# Patient Record
Sex: Female | Born: 1970 | Race: White | Hispanic: No | Marital: Single | Smoking: Current every day smoker
Health system: Southern US, Community
[De-identification: ages and names within clinical notes are randomized; demographics above are authoritative.]

## PROBLEM LIST (undated history)

## (undated) DIAGNOSIS — D649 Anemia, unspecified: Secondary | ICD-10-CM

## (undated) DIAGNOSIS — J189 Pneumonia, unspecified organism: Secondary | ICD-10-CM

## (undated) DIAGNOSIS — J449 Chronic obstructive pulmonary disease, unspecified: Secondary | ICD-10-CM

## (undated) DIAGNOSIS — D689 Coagulation defect, unspecified: Secondary | ICD-10-CM

## (undated) DIAGNOSIS — R05 Cough: Secondary | ICD-10-CM

## (undated) DIAGNOSIS — D75839 Thrombocytosis, unspecified: Secondary | ICD-10-CM

## (undated) DIAGNOSIS — I10 Essential (primary) hypertension: Secondary | ICD-10-CM

## (undated) DIAGNOSIS — G4700 Insomnia, unspecified: Secondary | ICD-10-CM

## (undated) DIAGNOSIS — M797 Fibromyalgia: Secondary | ICD-10-CM

## (undated) DIAGNOSIS — E876 Hypokalemia: Secondary | ICD-10-CM

## (undated) DIAGNOSIS — R0600 Dyspnea, unspecified: Secondary | ICD-10-CM

## (undated) DIAGNOSIS — E785 Hyperlipidemia, unspecified: Secondary | ICD-10-CM

## (undated) DIAGNOSIS — R079 Chest pain, unspecified: Secondary | ICD-10-CM

## (undated) DIAGNOSIS — R42 Dizziness and giddiness: Secondary | ICD-10-CM

## (undated) DIAGNOSIS — E559 Vitamin D deficiency, unspecified: Secondary | ICD-10-CM

## (undated) DIAGNOSIS — F419 Anxiety disorder, unspecified: Secondary | ICD-10-CM

## (undated) DIAGNOSIS — J45909 Unspecified asthma, uncomplicated: Secondary | ICD-10-CM

## (undated) DIAGNOSIS — D509 Iron deficiency anemia, unspecified: Secondary | ICD-10-CM

## (undated) DIAGNOSIS — D473 Essential (hemorrhagic) thrombocythemia: Secondary | ICD-10-CM

## (undated) DIAGNOSIS — R7303 Prediabetes: Secondary | ICD-10-CM

## (undated) DIAGNOSIS — T7840XA Allergy, unspecified, initial encounter: Secondary | ICD-10-CM

## (undated) HISTORY — PX: TRIGGER FINGER RELEASE: SHX641

## (undated) HISTORY — DX: Thrombocytosis, unspecified: D75.839

## (undated) HISTORY — DX: Anxiety disorder, unspecified: F41.9

## (undated) HISTORY — DX: Anemia, unspecified: D64.9

## (undated) HISTORY — DX: Iron deficiency anemia, unspecified: D50.9

## (undated) HISTORY — DX: Coagulation defect, unspecified: D68.9

## (undated) HISTORY — DX: Hypokalemia: E87.6

## (undated) HISTORY — DX: Allergy, unspecified, initial encounter: T78.40XA

## (undated) HISTORY — DX: Chest pain, unspecified: R07.9

## (undated) HISTORY — DX: Unspecified asthma, uncomplicated: J45.909

## (undated) HISTORY — DX: Essential (primary) hypertension: I10

## (undated) HISTORY — DX: Dizziness and giddiness: R42

---
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
## 2001-02-04 ENCOUNTER — Emergency Department (HOSPITAL_COMMUNITY): Admission: EM | Admit: 2001-02-04 | Discharge: 2001-02-05 | Payer: Self-pay | Admitting: Emergency Medicine

## 2001-02-04 ENCOUNTER — Encounter: Payer: Self-pay | Admitting: Emergency Medicine

## 2001-02-28 ENCOUNTER — Other Ambulatory Visit: Admission: RE | Admit: 2001-02-28 | Discharge: 2001-02-28 | Payer: Self-pay | Admitting: Obstetrics and Gynecology

## 2001-04-18 ENCOUNTER — Encounter: Payer: Self-pay | Admitting: Obstetrics & Gynecology

## 2001-04-18 ENCOUNTER — Ambulatory Visit (HOSPITAL_COMMUNITY): Admission: RE | Admit: 2001-04-18 | Discharge: 2001-04-18 | Payer: Self-pay | Admitting: Obstetrics & Gynecology

## 2001-05-02 HISTORY — PX: TUBAL LIGATION: SHX77

## 2001-05-02 HISTORY — PX: CHOLECYSTECTOMY: SHX55

## 2001-08-15 ENCOUNTER — Inpatient Hospital Stay (HOSPITAL_COMMUNITY): Admission: AD | Admit: 2001-08-15 | Discharge: 2001-08-15 | Payer: Self-pay | Admitting: Obstetrics and Gynecology

## 2001-09-05 ENCOUNTER — Inpatient Hospital Stay (HOSPITAL_COMMUNITY): Admission: AD | Admit: 2001-09-05 | Discharge: 2001-09-08 | Payer: Self-pay | Admitting: Obstetrics & Gynecology

## 2001-11-15 ENCOUNTER — Encounter (HOSPITAL_BASED_OUTPATIENT_CLINIC_OR_DEPARTMENT_OTHER): Payer: Self-pay | Admitting: General Surgery

## 2001-11-15 ENCOUNTER — Ambulatory Visit (HOSPITAL_COMMUNITY): Admission: RE | Admit: 2001-11-15 | Discharge: 2001-11-16 | Payer: Self-pay | Admitting: General Surgery

## 2001-11-15 ENCOUNTER — Encounter (INDEPENDENT_AMBULATORY_CARE_PROVIDER_SITE_OTHER): Payer: Self-pay | Admitting: Specialist

## 2004-02-19 ENCOUNTER — Ambulatory Visit (HOSPITAL_COMMUNITY): Admission: RE | Admit: 2004-02-19 | Discharge: 2004-02-19 | Payer: Self-pay | Admitting: Family Medicine

## 2004-02-19 ENCOUNTER — Emergency Department (HOSPITAL_COMMUNITY): Admission: EM | Admit: 2004-02-19 | Discharge: 2004-02-19 | Payer: Self-pay | Admitting: Family Medicine

## 2005-01-26 ENCOUNTER — Emergency Department (HOSPITAL_COMMUNITY): Admission: EM | Admit: 2005-01-26 | Discharge: 2005-01-26 | Payer: Self-pay | Admitting: Emergency Medicine

## 2007-05-01 ENCOUNTER — Emergency Department (HOSPITAL_COMMUNITY): Admission: EM | Admit: 2007-05-01 | Discharge: 2007-05-01 | Payer: Self-pay | Admitting: Emergency Medicine

## 2007-07-18 ENCOUNTER — Emergency Department (HOSPITAL_COMMUNITY): Admission: EM | Admit: 2007-07-18 | Discharge: 2007-07-19 | Payer: Self-pay | Admitting: Emergency Medicine

## 2009-08-09 ENCOUNTER — Emergency Department (HOSPITAL_COMMUNITY): Admission: EM | Admit: 2009-08-09 | Discharge: 2009-08-09 | Payer: Self-pay | Admitting: Family Medicine

## 2010-08-24 ENCOUNTER — Inpatient Hospital Stay (INDEPENDENT_AMBULATORY_CARE_PROVIDER_SITE_OTHER)
Admission: RE | Admit: 2010-08-24 | Discharge: 2010-08-24 | Disposition: A | Discharge status: Home / Self Care | Payer: BC Managed Care – PPO | Source: Ambulatory Visit | Attending: Family Medicine | Admitting: Family Medicine

## 2010-08-24 ENCOUNTER — Ambulatory Visit (INDEPENDENT_AMBULATORY_CARE_PROVIDER_SITE_OTHER): Payer: BC Managed Care – PPO

## 2010-08-24 DIAGNOSIS — J189 Pneumonia, unspecified organism: Secondary | ICD-10-CM

## 2010-09-02 ENCOUNTER — Emergency Department (HOSPITAL_COMMUNITY)
Admission: EM | Admit: 2010-09-02 | Discharge: 2010-09-03 | Disposition: A | Discharge status: Home / Self Care | Payer: BC Managed Care – PPO | Attending: Emergency Medicine | Admitting: Emergency Medicine

## 2010-09-02 ENCOUNTER — Emergency Department (HOSPITAL_COMMUNITY): Payer: BC Managed Care – PPO

## 2010-09-02 DIAGNOSIS — M25569 Pain in unspecified knee: Secondary | ICD-10-CM | POA: Insufficient documentation

## 2010-09-02 DIAGNOSIS — M7989 Other specified soft tissue disorders: Secondary | ICD-10-CM | POA: Insufficient documentation

## 2010-09-02 DIAGNOSIS — IMO0002 Reserved for concepts with insufficient information to code with codable children: Secondary | ICD-10-CM | POA: Insufficient documentation

## 2010-09-02 DIAGNOSIS — S8000XA Contusion of unspecified knee, initial encounter: Secondary | ICD-10-CM | POA: Insufficient documentation

## 2010-09-17 NOTE — Op Note (Signed)
[place]  Patient:    Erin Zamora, Erin Zamora Visit Number: [id number] MRN: [id number]          Service Type: DSU Location: 5700 [id number] [id number] Attending Physician:  Sonda Primes Dictated by:   Miguel Aschoff, M.D. Proc. Date: 11/15/01 Admit Date:  11/15/2001 Discharge Date: 11/16/2001                             Operative Report  PREOPERATIVE DIAGNOSIS:  Desire for sterilization.  POSTOPERATIVE DIAGNOSIS:  Desire for sterilization.  OPERATION PERFORMED:  Laparoscopic tubal using cautery and division.  SURGEON:  Miguel Aschoff, M.D.  ANESTHESIA:  General.  COMPLICATIONS:  None.  INDICATIONS FOR PROCEDURE:  The patient is a 40 year old white female gravida 3, para 2-0-1-2, who delivered on [birth year] 06, 2000.  The patient had requested that a sterilization procedure be performed and has given her informed consent for tubal sterilization and has all signed all appropriate Medicaid forms 30 days prior to this procedure being done. The patient was found to have gallstones and was to undergo a laparoscopic cholecystectomy by Dr. Leonie Man and at the time of this procedure requested that the tubal sterilization be performed simultaneously.  With informed consents being obtained, the procedure was carried out.  DESCRIPTION OF PROCEDURE:  The patient was already under general anesthesia and prepped and draped with the laparoscope in place from laparoscopic cholecystectomy.  The abdomen was inflated and there was good visualization of the uterus and fallopian tubes and ovaries.  A superpubic 5 mm port was established under direct visualization and then the tripolar cautery forceps introduced.  The midportion of each tube was grasped and cauterized for approximately 3 cm and then the tubes were divided in the midsection via cautery without difficulty with good separation and good hemostasis.  The ovaries and tubes otherwise were within normal limits  as was the uterus and no other abnormalities were noted in the pelvis.  At this point this portion of the procedure was completed and Dr. Lurene Shadow proceeded to remove the remainder of the laparoscopic instruments and closed the abdomen.  This portion of the procedure will be dictated as a separate note as well as the laparoscopic cholecystectomy procedure.  The patient was taken to the recovery room in satisfactory.  She is to be seen back for her follow-up visit with me in four weeks.  She is to call if there are any problems such as fever, pain or heavy bleeding. Dictated by:   Miguel Aschoff, M.D. Attending Physician:  Sonda Primes DD:  11/15/01 TD:  11/20/01 Job: [id number]

## 2010-09-17 NOTE — Op Note (Signed)
[place]  Patient:    Erin Zamora, Erin Zamora Visit Number: [id number] MRN: [id number]          Service Type: DSU Location: 5700 5741 [id number] Attending Physician:  Sonda Primes Dictated by:   Mardene Celeste. Lurene Shadow, M.D. Proc. Date: 11/15/01 Admit Date:  11/15/2001 Discharge Date: 11/16/2001                             Operative Report  PREOPERATIVE DIAGNOSIS:  Chronic calculus cholecystitis.  POSTOPERATIVE DIAGNOSIS:  Chronic calculus cholecystitis.  OPERATION PERFORMED:  Laparoscopic cholecystectomy with intraoperative cholangiogram.  SURGEON:  Luisa Hart L. Lurene Shadow, M.D.  ASSISTANT:  Marnee Spring. Wiliam Ke, M.D.  ANESTHESIA:  General.  INDICATIONS FOR PROCEDURE:  This patient is a 40 year old woman two months postpartum presenting with symptoms of biliary colic.  She has been evaluated and noted to have cholelithiasis by ultrasound.  Liver function studies were all within normal limits.  She comes now to the operating room for laparoscopic cholecystectomy after the risks and potential benefits of surgery have been fully discussed and she gives consent.  She is also scheduled to have bilateral tubal ligation by Miguel Aschoff, M.D. and these operations will be done in sequence.  DESCRIPTION OF PROCEDURE:  Following the induction of satisfactory general anesthesia with the patient positioned supinely.  The abdomen was prepped and draped routinely.  The open laparoscopy was carried out at the umbilicus with insertion of a Hasson cannula an insufflation of a peritoneal cavity with 14 mHg pressure.  Camera was inserted and visual exploration of the abdomen carried out.  The gallbladder was noted to be chronically scarred.  The liver edge was sharp, the liver surface smooth, anterior gastric wall, duodenal sweep appeared to be normal.  None of the small or large intestines we viewed appeared to be abnormal.  We also viewed the uterus.  There was a  small fibroid over the dome of the uterus.  The tubes and bilateral ovaries could be seen clearly and appeared to be normal.  Under direct vision, epigastric and lateral ports were placed.  The gallbladder was grasped and retracted cephalad and dissection carried down by the ampulla of the gallbladder with isolation of the cystic artery and cystic duct, the cystic artery being traced up to its entry in the gallbladder wall and the cystic duct being traced up to its gallbladder cystic duct junction and down to the common duct cystic duct junction.  The cystic artery was doubly clipped and transected.  The cystic duct was clipped proximally and opened.  A cystic duct cholangiogram was carried out after a Reddick catheter was passed into the abdomen and inserted into the cystic duct.  A one half strength Hypaque solution was injected into the extrahepatic biliary system. The cholangiogram showed free flow of contrast into the duodenum.  Normal caliber and no filling defects noted.  The upper radicals also appeared normal.  The cholangiocatheter was then removed and the cystic duct was doubly clipped and transected.  The gallbladder was then dissected free from the liver bed using electrocautery and maintaining hemostasis throughout the entire course of the dissection.  At the end of the dissection, the liver bed was again inspected.  Additional bleeding points were treated with electrocautery.  The camera was then removed through the epigastric port, the gallbladder was retrieved through the umbilical port without difficulty.  The lateral flank wounds were then closed.  At this  point Dr. Tenny Craw entered the operative field.  He made a superpubic incision and carried out a bilateral tubal ligation which will be dictated in a separate note.  At the end, sponge, instrument and sharp counts were verified. Pneumoperitoneum was allowed to deflate and the abdominal would was closed in layers as  followed.  The umbilical wound in two layers with 0 Dexon and 4-0 Dexon, the epigastric wound with 4-0 Dexon and the suprapubic wound with 4-0 Dexon.  All the wounds were then reinforced with Steri-Strips.  Sterile dressing was applied.  Anesthetic reversed.  Patient removed from the operating room to the recovery room in stable condition having tolerated the procedure well. Dictated by:   Mardene Celeste. Lurene Shadow, M.D. Attending Physician:  Sonda Primes DD:  11/15/01 TD:  11/20/01 Job: [id number]

## 2011-01-24 LAB — CBC
HCT: 38.9
MCHC: 34.6
MCV: 89
Platelets: 427 — ABNORMAL HIGH
RBC: 4.37
WBC: 14.8 — ABNORMAL HIGH

## 2011-01-24 LAB — I-STAT 8, (EC8 V) (CONVERTED LAB)
Acid-Base Excess: 1
BUN: 7
Operator id: 257131
Sodium: 137
pCO2, Ven: 41.6 — ABNORMAL LOW
pH, Ven: 7.399 — ABNORMAL HIGH

## 2011-01-24 LAB — URINE MICROSCOPIC-ADD ON

## 2011-01-24 LAB — URINALYSIS, ROUTINE W REFLEX MICROSCOPIC
Glucose, UA: NEGATIVE
Ketones, ur: 15 — AB
Specific Gravity, Urine: 1.008
Urobilinogen, UA: 0.2

## 2011-01-24 LAB — DIFFERENTIAL
Basophils Absolute: 0.1
Basophils Relative: 1
Eosinophils Relative: 1
Lymphocytes Relative: 19
Lymphs Abs: 2.8
Neutro Abs: 10.8 — ABNORMAL HIGH

## 2011-01-24 LAB — POCT I-STAT CREATININE
Creatinine, Ser: 0.8
Operator id: 257131

## 2011-01-24 LAB — POCT PREGNANCY, URINE
Operator id: 257131
Preg Test, Ur: NEGATIVE

## 2011-01-24 LAB — URINE CULTURE

## 2011-02-04 LAB — COMPREHENSIVE METABOLIC PANEL
Alkaline Phosphatase: 49
BUN: 15
CO2: 29
Chloride: 100
Creatinine, Ser: 1.74 — ABNORMAL HIGH
GFR calc Af Amer: 40 — ABNORMAL LOW
GFR calc non Af Amer: 33 — ABNORMAL LOW
Glucose, Bld: 106 — ABNORMAL HIGH
Potassium: 4.4
Sodium: 139
Total Bilirubin: 0.9

## 2011-02-04 LAB — CBC
Hemoglobin: 13.8
MCHC: 34.7
MCV: 88.2
RBC: 4.5
RDW: 12.8
WBC: 15.1 — ABNORMAL HIGH

## 2011-02-04 LAB — DIFFERENTIAL: Lymphocytes Relative: 7 — ABNORMAL LOW

## 2011-02-04 LAB — URINALYSIS, ROUTINE W REFLEX MICROSCOPIC
Bilirubin Urine: NEGATIVE
Protein, ur: NEGATIVE
Urobilinogen, UA: 0.2

## 2011-02-04 LAB — URINE CULTURE: Culture: NO GROWTH

## 2011-02-04 LAB — POCT PREGNANCY, URINE: Operator id: 198171

## 2011-02-04 LAB — URINE MICROSCOPIC-ADD ON

## 2011-09-12 ENCOUNTER — Encounter (HOSPITAL_COMMUNITY): Payer: Self-pay | Admitting: Emergency Medicine

## 2011-09-12 ENCOUNTER — Emergency Department (INDEPENDENT_AMBULATORY_CARE_PROVIDER_SITE_OTHER)
Admission: EM | Admit: 2011-09-12 | Discharge: 2011-09-12 | Disposition: A | Discharge status: Home / Self Care | Payer: BC Managed Care – PPO | Source: Home / Self Care | Attending: Family Medicine | Admitting: Family Medicine

## 2011-09-12 DIAGNOSIS — J019 Acute sinusitis, unspecified: Secondary | ICD-10-CM

## 2011-09-12 MED ORDER — AMOXICILLIN 500 MG PO CAPS: 500.0000 mg | ORAL_CAPSULE | Freq: Three times a day (TID) | ORAL | Status: DC

## 2011-09-12 MED ORDER — AMOXICILLIN 500 MG PO CAPS: 500.0000 mg | ORAL_CAPSULE | Freq: Three times a day (TID) | ORAL | Status: AC

## 2011-09-12 NOTE — ED Provider Notes (Signed)
History     CSN: [id number]  Arrival date & time 09/12/11  1738   None     Chief Complaint  Patient presents with  . Sore Throat    (Consider location/radiation/quality/duration/timing/severity/associated sxs/prior treatment) Patient is a 41 y.o. female presenting with cough. The history is provided by the patient. No language interpreter was used.  Cough This is a new problem. The current episode started yesterday. The problem occurs constantly. The problem has been gradually worsening. The cough is productive of sputum. There has been no fever. Associated symptoms include ear pain, headaches, rhinorrhea and sore throat. She has tried nothing for the symptoms. The treatment provided moderate relief. Her past medical history does not include pneumonia.  t reports a hitory of sinus infections.  Pt complains of congestion and pain in the right side of her face.  No relief with otc decongestants  History reviewed. No pertinent past medical history.  Past Surgical History  Procedure Date  . Appendectomy   . Cholecystectomy   . Tubal ligation     No family history on file.  History  Substance Use Topics  . Smoking status: Current Everyday Smoker  . Smokeless tobacco: Not on file  . Alcohol Use: No    OB History    Grav Para Term Preterm Abortions TAB SAB Ect Mult Living                  Review of Systems  HENT: Positive for ear pain, sore throat and rhinorrhea.   Respiratory: Positive for cough.   Neurological: Positive for headaches.  All other systems reviewed and are negative.    Allergies  Review of patient's allergies indicates no known allergies.  Home Medications   Current Outpatient Rx  Name Route Sig Dispense Refill  . OVER THE COUNTER MEDICATION  Severe cold medicines      BP 145/89  Pulse 80  Temp(Src) 98.3 F (36.8 C) (Oral)  Resp 20  SpO2 99%  LMP 09/05/2011  Physical Exam  Vitals reviewed. Constitutional: She is oriented to person,  place, and time. She appears well-developed and well-nourished.  HENT:  Head: Normocephalic and atraumatic.  Nose: Nose normal.  Mouth/Throat: Oropharynx is clear and moist.       Tender bilat maxillary sinuses  Eyes: Conjunctivae and EOM are normal. Pupils are equal, round, and reactive to light.  Neck: Normal range of motion. Neck supple.  Cardiovascular: Normal rate and regular rhythm.   Pulmonary/Chest: Effort normal and breath sounds normal.  Abdominal: Soft.  Musculoskeletal: Normal range of motion.  Neurological: She is alert and oriented to person, place, and time.  Skin: Skin is warm.  Psychiatric: She has a normal mood and affect.    ED Course  Procedures (including critical care time)  Labs Reviewed - No data to display No results found.   No diagnosis found.    MDM  Rx amoxicillian,  Cont decongestants.           Lonia Skinner [place], [place] 09/12/11 2032  Lonia Skinner [place], [place] 09/12/11 2136  Lonia Skinner [place], [place] 09/12/11 2145

## 2011-09-12 NOTE — Discharge Instructions (Signed)
Sinusitis Sinuses are air pockets within the bones of your face. The growth of bacteria within a sinus leads to infection. The infection prevents the sinuses from draining. This infection is called sinusitis. SYMPTOMS  There will be different areas of pain depending on which sinuses have become infected.  The maxillary sinuses often produce pain beneath the eyes.   Frontal sinusitis may cause pain in the middle of the forehead and above the eyes.  Other problems (symptoms) include:  Toothaches.   Colored, pus-like (purulent) drainage from the nose.   Swelling, warmth, and tenderness over the sinus areas may be signs of infection.  TREATMENT  Sinusitis is most often determined by an exam.X-rays may be taken. If x-rays have been taken, make sure you obtain your results or find out how you are to obtain them. Your caregiver may give you medications (antibiotics). These are medications that will help kill the bacteria causing the infection. You may also be given a medication (decongestant) that helps to reduce sinus swelling.  HOME CARE INSTRUCTIONS   Only take over-the-counter or prescription medicines for pain, discomfort, or fever as directed by your caregiver.   Drink extra fluids. Fluids help thin the mucus so your sinuses can drain more easily.   Applying either moist heat or ice packs to the sinus areas may help relieve discomfort.   Use saline nasal sprays to help moisten your sinuses. The sprays can be found at your local drugstore.  SEEK IMMEDIATE MEDICAL CARE IF:  You have a fever.   You have increasing pain, severe headaches, or toothache.   You have nausea, vomiting, or drowsiness.   You develop unusual swelling around the face or trouble seeing.  MAKE SURE YOU:   Understand these instructions.   Will watch your condition.   Will get help right away if you are not doing well or get worse.  Document Released: 04/18/2005 Document Revised: 04/07/2011 Document Reviewed:  11/15/2006 ExitCare Patient Information 2012 [employer].Sinusitis Sinuses are air pockets within the bones of your face. The growth of bacteria within a sinus leads to infection. The infection prevents the sinuses from draining. This infection is called sinusitis. SYMPTOMS  There will be different areas of pain depending on which sinuses have become infected.  The maxillary sinuses often produce pain beneath the eyes.   Frontal sinusitis may cause pain in the middle of the forehead and above the eyes.  Other problems (symptoms) include:  Toothaches.   Colored, pus-like (purulent) drainage from the nose.   Swelling, warmth, and tenderness over the sinus areas may be signs of infection.  TREATMENT  Sinusitis is most often determined by an exam.X-rays may be taken. If x-rays have been taken, make sure you obtain your results or find out how you are to obtain them. Your caregiver may give you medications (antibiotics). These are medications that will help kill the bacteria causing the infection. You may also be given a medication (decongestant) that helps to reduce sinus swelling.  HOME CARE INSTRUCTIONS   Only take over-the-counter or prescription medicines for pain, discomfort, or fever as directed by your caregiver.   Drink extra fluids. Fluids help thin the mucus so your sinuses can drain more easily.   Applying either moist heat or ice packs to the sinus areas may help relieve discomfort.   Use saline nasal sprays to help moisten your sinuses. The sprays can be found at your local drugstore.  SEEK IMMEDIATE MEDICAL CARE IF:  You have a fever.     You have increasing pain, severe headaches, or toothache.   You have nausea, vomiting, or drowsiness.   You develop unusual swelling around the face or trouble seeing.  MAKE SURE YOU:   Understand these instructions.   Will watch your condition.   Will get help right away if you are not doing well or get worse.  Document  Released: 04/18/2005 Document Revised: 04/07/2011 Document Reviewed: 11/15/2006 ExitCare Patient Information 2012 [employer]. 

## 2011-09-12 NOTE — ED Notes (Signed)
C/o sore throat, watery eyes, ears, neck , and throat hurts.  Onset yesterday, significantly worsened today.  Chest soreness, center chest and mid back and ribcage.  Non productive cough, blowing clear phlegm

## 2011-09-13 NOTE — ED Provider Notes (Signed)
Medical screening examination/treatment/procedure(s) were performed by non-physician practitioner and as supervising physician I was immediately available for consultation/collaboration.   MORENO-COLL,Tehillah Cipriani; MD   Tommye Lehenbauer Moreno-Coll, MD 09/13/11 0114 

## 2012-02-18 ENCOUNTER — Encounter (HOSPITAL_COMMUNITY): Payer: Self-pay | Admitting: *Deleted

## 2012-02-18 ENCOUNTER — Emergency Department (HOSPITAL_COMMUNITY)
Admission: EM | Admit: 2012-02-18 | Discharge: 2012-02-18 | Disposition: A | Discharge status: Home / Self Care | Payer: Self-pay | Attending: Emergency Medicine | Admitting: Emergency Medicine

## 2012-02-18 ENCOUNTER — Emergency Department (HOSPITAL_COMMUNITY): Payer: Self-pay

## 2012-02-18 DIAGNOSIS — Z9089 Acquired absence of other organs: Secondary | ICD-10-CM | POA: Insufficient documentation

## 2012-02-18 DIAGNOSIS — R109 Unspecified abdominal pain: Secondary | ICD-10-CM | POA: Insufficient documentation

## 2012-02-18 DIAGNOSIS — R10816 Epigastric abdominal tenderness: Secondary | ICD-10-CM | POA: Insufficient documentation

## 2012-02-18 LAB — URINALYSIS, ROUTINE W REFLEX MICROSCOPIC
Bilirubin Urine: NEGATIVE
Glucose, UA: NEGATIVE mg/dL
Hgb urine dipstick: NEGATIVE
Ketones, ur: NEGATIVE mg/dL
Leukocytes, UA: NEGATIVE
Nitrite: NEGATIVE
Protein, ur: NEGATIVE mg/dL
Specific Gravity, Urine: 1.015 (ref 1.005–1.030)
Urobilinogen, UA: 1 mg/dL (ref 0.0–1.0)
pH: 7.5 (ref 5.0–8.0)

## 2012-02-18 LAB — CBC WITH DIFFERENTIAL/PLATELET
Basophils Absolute: 0.1 10*3/uL (ref 0.0–0.1)
Eosinophils Absolute: 0.4 10*3/uL (ref 0.0–0.7)
Eosinophils Relative: 3 % (ref 0–5)
HCT: 37.1 % (ref 36.0–46.0)
Lymphocytes Relative: 35 % (ref 12–46)
Lymphs Abs: 4.2 10*3/uL — ABNORMAL HIGH (ref 0.7–4.0)
MCH: 28.2 pg (ref 26.0–34.0)
MCV: 83.6 fL (ref 78.0–100.0)
Monocytes Absolute: 0.9 10*3/uL (ref 0.1–1.0)
RDW: 14.2 % (ref 11.5–15.5)
WBC: 11.9 10*3/uL — ABNORMAL HIGH (ref 4.0–10.5)

## 2012-02-18 LAB — COMPREHENSIVE METABOLIC PANEL
CO2: 25 mEq/L (ref 19–32)
Calcium: 9.4 mg/dL (ref 8.4–10.5)
Creatinine, Ser: 0.58 mg/dL (ref 0.50–1.10)
GFR calc Af Amer: >90 mL/min (ref >90)
GFR calc non Af Amer: >90 mL/min (ref >90)
Glucose, Bld: 119 mg/dL — ABNORMAL HIGH (ref 70–99)

## 2012-02-18 LAB — PREGNANCY, URINE: Preg Test, Ur: NEGATIVE

## 2012-02-18 MED ORDER — IOHEXOL 300 MG/ML  SOLN
100.0000 mL | Freq: Once | INTRAMUSCULAR | Status: AC | PRN
  Administered 2012-02-18: 100 mL via INTRAVENOUS

## 2012-02-18 MED ORDER — SODIUM CHLORIDE 0.9 % IV BOLUS (SEPSIS)
1000.0000 mL | Freq: Once | INTRAVENOUS | Status: AC
  Administered 2012-02-18: 1000 mL via INTRAVENOUS

## 2012-02-18 MED ORDER — SODIUM CHLORIDE 0.9 % IV BOLUS (SEPSIS): 500.0000 mL | Freq: Once | INTRAVENOUS | Status: DC

## 2012-02-18 MED ORDER — IBUPROFEN 800 MG PO TABS: 800.0000 mg | ORAL_TABLET | Freq: Three times a day (TID) | ORAL | Status: DC

## 2012-02-18 MED ORDER — OXYCODONE-ACETAMINOPHEN 5-325 MG PO TABS: 1.0000 | ORAL_TABLET | Freq: Four times a day (QID) | ORAL | Status: DC | PRN

## 2012-02-18 MED ORDER — IOHEXOL 300 MG/ML  SOLN
20.0000 mL | INTRAMUSCULAR | Status: AC
  Administered 2012-02-18: 20 mL via ORAL

## 2012-02-18 NOTE — ED Provider Notes (Signed)
History     CSN: [id number]  Arrival date & time 02/18/12  4098   First MD Initiated Contact with Patient 02/18/12 2016      Chief Complaint  Patient presents with  . Flank Pain  . Abdominal Pain    (Consider location/radiation/quality/duration/timing/severity/associated sxs/prior treatment) HPI Pt reports L flank pain and LUQ abdominal pain for the last several days, no known injury. No N/V/D, dysuria or hematuria. No fever. Pain is worse with lying on that side. No prior history of same. Not improved with Motrin at home.   History reviewed. No pertinent past medical history.  Past Surgical History  Procedure Date  . Appendectomy   . Cholecystectomy   . Tubal ligation     History reviewed. No pertinent family history.  History  Substance Use Topics  . Smoking status: Current Every Day Smoker  . Smokeless tobacco: Not on file  . Alcohol Use: No    OB History    Grav Para Term Preterm Abortions TAB SAB Ect Mult Living                  Review of Systems All other systems reviewed and are negative except as noted in HPI.   Allergies  Codeine  Home Medications   Current Outpatient Rx  Name Route Sig Dispense Refill  . IBUPROFEN 200 MG PO TABS Oral Take 800 mg by mouth every 6 (six) hours as needed. For pain      BP 145/92  Pulse 99  Temp 98.4 F (36.9 C) (Oral)  Resp 20  SpO2 97%  LMP 02/06/2012  Physical Exam  Nursing note and vitals reviewed. Constitutional: She is oriented to person, place, and time. She appears well-developed and well-nourished.  HENT:  Head: Normocephalic and atraumatic.  Eyes: EOM are normal. Pupils are equal, round, and reactive to light.  Neck: Normal range of motion. Neck supple.  Cardiovascular: Normal rate, normal heart sounds and intact distal pulses.   Pulmonary/Chest: Effort normal and breath sounds normal.  Abdominal: Bowel sounds are normal. She exhibits no distension. There is tenderness (mild epigastric  tenderness). There is no rebound and no guarding.  Musculoskeletal: Normal range of motion. She exhibits no edema and no tenderness.  Neurological: She is alert and oriented to person, place, and time. She has normal strength. No cranial nerve deficit or sensory deficit.  Skin: Skin is warm and dry. No rash noted.  Psychiatric: She has a normal mood and affect.    ED Course  Procedures (including critical care time)  Labs Reviewed  URINALYSIS, ROUTINE W REFLEX MICROSCOPIC - Abnormal; Notable for the following:    APPearance CLOUDY (*)     All other components within normal limits  CBC WITH DIFFERENTIAL - Abnormal; Notable for the following:    WBC 11.9 (*)     Platelets 485 (*)     Lymphs Abs 4.2 (*)     All other components within normal limits  COMPREHENSIVE METABOLIC PANEL - Abnormal; Notable for the following:    Glucose, Bld 119 (*)     Total Bilirubin 0.2 (*)     All other components within normal limits  PREGNANCY, URINE  LIPASE, BLOOD   Ct Abdomen Pelvis W Contrast  02/18/2012  *RADIOLOGY REPORT*  Clinical Data: Left flank/upper abdominal pain  CT ABDOMEN AND PELVIS WITH CONTRAST  Technique:  Multidetector CT imaging of the abdomen and pelvis was performed following the standard protocol during bolus administration of intravenous contrast.  Contrast:  1 OMNIPAQUE IOHEXOL 300 MG/ML  SOLN, OMNIPAQUE IOHEXOL 300 MG/ML  SOLN  Comparison: 05/01/2007  Findings: Lung bases are essentially clear.  Liver, spleen, pancreas, and adrenal glands are within normal limits.  Status post cholecystectomy.  No intrahepatic or extrahepatic ductal dilatation.  Two subcentimeter hypoenhancing interpolar right renal lesions (series 7/image 17), too small to characterize.  Left kidney is within normal limits.  No hydronephrosis.  No evidence of bowel obstruction.  Prior appendectomy.  Colonic wall thickening or inflammatory changes.  No evidence of abdominal aortic aneurysm.  No abdominopelvic  ascites.  No suspicious abdominopelvic lymphadenopathy.  Uterus bilateral ovaries are unremarkable.  Bladder is within normal limits.  Mild degenerative changes of the visualized thoracolumbar spine.  IMPRESSION: No evidence of bowel obstruction.  No colonic wall thickening or inflammatory changes.  CT findings to account for the patient's left flank/upper abdominal pain.   Original Report Authenticated By: Charline Bills, M.D.      No diagnosis found.    MDM  Labs and imaging unremarkable. Possibly an early shingles rash as well. Advised followup if symptoms persist or if rash develops.         Charles B. Bernette Mayers, MD 02/18/12 [phone]

## 2012-02-18 NOTE — ED Notes (Signed)
Pt reports left flank pain radiating towards left upper quadrant, denies any urinary symptoms, reports unable to get comfortable. Reports feeling light headed, denies any n/v/d.

## 2012-11-15 ENCOUNTER — Ambulatory Visit (INDEPENDENT_AMBULATORY_CARE_PROVIDER_SITE_OTHER): Payer: 59 | Admitting: Emergency Medicine

## 2012-11-15 ENCOUNTER — Ambulatory Visit: Payer: 59

## 2012-11-15 VITALS — BP 116/78 | HR 84 | Temp 98.2°F | Resp 16 | Ht 65.5 in | Wt 212.0 lb

## 2012-11-15 DIAGNOSIS — S60229A Contusion of unspecified hand, initial encounter: Secondary | ICD-10-CM

## 2012-11-15 DIAGNOSIS — S60221A Contusion of right hand, initial encounter: Secondary | ICD-10-CM

## 2012-11-15 MED ORDER — NAPROXEN SODIUM 550 MG PO TABS: 550.0000 mg | ORAL_TABLET | Freq: Two times a day (BID) | ORAL | Status: AC

## 2012-11-15 NOTE — Progress Notes (Signed)
[facility] [address] [place] [address] [phone]  Date:  11/15/2012   Name:  Erin Zamora   DOB:  1971/03/14   MRN:  [id number]  PCP:  Default, Provider, MD    Chief Complaint: Hand Injury   History of Present Illness:  Erin Zamora is a 42 y.o. very pleasant female patient who presents with the following:  Reached overhead at home and was struck in the hand and second finger by a ceiling fan.  She has pain in the dorsal hand and second finger.  Pain and swelling.  Pain radiates up the arm and she has swelling that interferes with function.  No improvement with over the counter medications or other home remedies. Denies other complaint or health concern today.   There are no active problems to display for this patient.   Past Medical History  Diagnosis Date  . Anemia     Past Surgical History  Procedure Laterality Date  . Appendectomy    . Cholecystectomy    . Tubal ligation      History  Substance Use Topics  . Smoking status: Current Every Day Smoker  . Smokeless tobacco: Not on file  . Alcohol Use: No    No family history on file.  Allergies  Allergen Reactions  . Codeine Itching and Nausea And Vomiting    Medication list has been reviewed and updated.  No current outpatient prescriptions on file prior to visit.   No current facility-administered medications on file prior to visit.    Review of Systems:  As per HPI, otherwise negative.    Physical Examination: Filed Vitals:   11/15/12 1019  BP: 116/78  Pulse: 84  Temp: 98.2 F (36.8 C)  Resp: 16   Filed Vitals:   11/15/12 1019  Height: 5' 5.5" (1.664 m)  Weight: 212 lb (96.163 kg)   Body mass index is 34.73 kg/(m^2). Ideal Body Weight: Weight in (lb) to have BMI = 25: 152.2   GEN: WDWN, NAD, Non-toxic, Alert & Oriented x 3 HEENT: Atraumatic, Normocephalic.  Ears and Nose: No external deformity. EXTR: No clubbing/cyanosis/edema NEURO:  Normal gait.  PSYCH: Normally interactive. Conversant. Not depressed or anxious appearing.  Calm demeanor.  RIGHT hand:  Tender swollen second finger with some ecchymosis at PIP.  No deformity.  Guards   Assessment and Plan: Contusion hand and finger Buddy tape Anaprox Ice   Signed,  Phillips Odor, MD   [facility] reading (PRIMARY) by  Dr. Dareen Piano.  negative.

## 2012-11-15 NOTE — Patient Instructions (Addendum)
Contusion A contusion is a deep bruise. Contusions are the result of an injury that caused bleeding under the skin. The contusion may turn blue, purple, or yellow. Minor injuries will give you a painless contusion, but more severe contusions may stay painful and swollen for a few weeks.  CAUSES  A contusion is usually caused by a blow, trauma, or direct force to an area of the body. SYMPTOMS   Swelling and redness of the injured area.  Bruising of the injured area.  Tenderness and soreness of the injured area.  Pain. DIAGNOSIS  The diagnosis can be made by taking a history and physical exam. An X-ray, CT scan, or MRI may be needed to determine if there were any associated injuries, such as fractures. TREATMENT  Specific treatment will depend on what area of the body was injured. In general, the best treatment for a contusion is resting, icing, elevating, and applying cold compresses to the injured area. Over-the-counter medicines may also be recommended for pain control. Ask your caregiver what the best treatment is for your contusion. HOME CARE INSTRUCTIONS   Put ice on the injured area.  Put ice in a plastic bag.  Place a towel between your skin and the bag.  Leave the ice on for 15-20 minutes, 3-4 times a day.  Only take over-the-counter or prescription medicines for pain, discomfort, or fever as directed by your caregiver. Your caregiver may recommend avoiding anti-inflammatory medicines (aspirin, ibuprofen, and naproxen) for 48 hours because these medicines may increase bruising.  Rest the injured area.  If possible, elevate the injured area to reduce swelling. SEEK IMMEDIATE MEDICAL CARE IF:   You have increased bruising or swelling.  You have pain that is getting worse.  Your swelling or pain is not relieved with medicines. MAKE SURE YOU:   Understand these instructions.  Will watch your condition.  Will get help right away if you are not doing well or get  worse. Document Released: 01/26/2005 Document Revised: 07/11/2011 Document Reviewed: 02/21/2011 Exit[employer] Patient Information 2014 [employer].  

## 2014-02-04 ENCOUNTER — Encounter: Payer: Self-pay | Admitting: Family Medicine

## 2015-01-29 ENCOUNTER — Encounter: Payer: Self-pay | Admitting: Physician Assistant

## 2015-01-29 ENCOUNTER — Ambulatory Visit (INDEPENDENT_AMBULATORY_CARE_PROVIDER_SITE_OTHER): Payer: 59 | Admitting: Physician Assistant

## 2015-01-29 VITALS — BP 126/78 | HR 80 | Temp 98.5°F | Resp 16 | Ht 65.0 in | Wt 204.0 lb

## 2015-01-29 DIAGNOSIS — Z1231 Encounter for screening mammogram for malignant neoplasm of breast: Secondary | ICD-10-CM

## 2015-01-29 DIAGNOSIS — Z0000 Encounter for general adult medical examination without abnormal findings: Secondary | ICD-10-CM | POA: Diagnosis not present

## 2015-01-29 NOTE — Progress Notes (Signed)
   Subjective:    Patient ID: Erin Zamora, female    DOB: [birth year], 44 y.o.   MRN: [id number]  HPI    Review of Systems  HENT: Positive for dental problem.   Gastrointestinal: Positive for diarrhea and constipation.  Endocrine: Positive for polyuria.  Genitourinary: Positive for frequency.  Musculoskeletal: Positive for back pain and arthralgias.  Allergic/Immunologic: Positive for environmental allergies.  Psychiatric/Behavioral: Positive for agitation. The patient is nervous/anxious.        Objective:   Physical Exam        Assessment & Plan:

## 2015-01-29 NOTE — Patient Instructions (Signed)

## 2015-01-29 NOTE — Progress Notes (Signed)
Erin Zamora  MRN: [id number] DOB: 1971-04-29  Subjective:  Pt presents to clinic for a CPE.  She needs to have a CPE for insurance benefits to be cheaper.  She has had blood work through her work and she does not want them repeated today.  She thinks that her HDL was slightly low because she was told that she needs to exercise.  She does not think that her TSH was checked but she does not want to check that today.  She has a gyn that does her pap smears but she has not had a mammogram and would like [place] to order that for her today.  Pt's only concern today is her increase worry and anxiety - seems to be worse around social media.  She knows that it is worse around ovulation and her menses.  Her menses recently has become irregular in regards to length and flow but is still regular in onset.  She has noticed that the stress and irritability have worsened over the last year.  She has a short temper.  She believes a lot of this started when she was almost a victim to fraud phone calls.  She does not really want to so anything about this because even though it affects her daily she does not think it is negatively affecting her life or others around her.  Though her older daughter did recently tell her that she needs medication for her anxiety and her teenager told her she is yelling to much (she feels like this is teenage behavior).  Last dental exam: 6 months ago - Last vision exam: within the last year Last pap smear: [place] - a while - normal Last mammogram: not recently - ;last one at age 58 Vaccinations      Tetanus 2011   There are no active problems to display for this patient.   No current outpatient prescriptions on file prior to visit.   No current facility-administered medications on file prior to visit.    Allergies  Allergen Reactions  . Codeine Itching and Nausea And Vomiting    Social History   Social History  . Marital Status: Single    Spouse  Name: N/A  . Number of Children: N/A  . Years of Education: N/A   Social History Main Topics  . Smoking status: Former Smoker    Quit date: 02/05/2012  . Smokeless tobacco: Never Used  . Alcohol Use: 0.0 oz/week    0 Standard drinks or equivalent per week  . Drug Use: No  . Sexual Activity:    Partners: Male    Birth Control/ Protection: None   Other Topics Concern  . None   Social History Narrative   43 year old and 72 y/o daughters   38 year old granddaughter - takes care of her in the afternoon after school   Significant other - 15 years   Work: Web designer       Past Surgical History  Procedure Laterality Date  . Appendectomy  1991  . Cholecystectomy  2003  . Tubal ligation  2003    Family History  Problem Relation Age of Onset  . Alcohol abuse Mother   . COPD Mother   . Hypertension Mother   . Alcohol abuse Father   . Diabetes Father   . Drug abuse Father   . Heart disease Father   . Hypertension Father   . Kidney disease Father   . Arthritis Maternal Grandmother   .  Arthritis Maternal Grandfather   . Cancer Paternal Grandmother   . Diabetes Paternal Grandmother   . Heart disease Paternal Grandmother   . Diabetes Paternal Grandfather   . Heart disease Paternal Grandfather   . Kidney disease Paternal Grandfather   . Stroke Paternal Grandfather     Review of Systems  Constitutional: Negative.   HENT: Positive for dental problem (sees a dentist - not new).   Eyes: Negative.   Respiratory: Negative.   Cardiovascular: Negative.   Gastrointestinal: Positive for diarrhea and constipation.       H/o IBS with intermittent diarrhea and constipation - worse with increase in her stress levels  Endocrine: Negative.   Genitourinary: Negative.   Musculoskeletal: Positive for back pain (known mid-lower DJD - has a bone spur - no change in her baseline).  Allergic/Immunologic: Negative.   Neurological: Negative.   Psychiatric/Behavioral: The patient  is nervous/anxious.    Objective:  BP 126/78 mmHg  Pulse 80  Temp(Src) 98.5 F (36.9 C)  Resp 16  Ht 5\' 5"  (1.651 m)  Wt 204 lb (92.534 kg)  BMI 33.95 kg/m2  Physical Exam  Constitutional: She is oriented to person, place, and time and well-developed, well-nourished, and in no distress.  HENT:  Head: Normocephalic and atraumatic.  Right Ear: Hearing, tympanic membrane, external ear and ear canal normal.  Left Ear: Hearing, tympanic membrane, external ear and ear canal normal.  Nose: Nose normal.  Mouth/Throat: Uvula is midline, oropharynx is clear and moist and mucous membranes are normal.  Eyes: Conjunctivae and EOM are normal. Pupils are equal, round, and reactive to light.  Neck: Trachea normal and normal range of motion. Neck supple. No thyroid mass and no thyromegaly present.  Cardiovascular: Normal rate, regular rhythm and normal heart sounds.   No murmur heard. Pulmonary/Chest: Effort normal and breath sounds normal. She has no wheezes.  Abdominal: Soft. Bowel sounds are normal. There is no tenderness.  Musculoskeletal: Normal range of motion.  Lymphadenopathy:    She has no cervical adenopathy.  Neurological: She is alert and oriented to person, place, and time. She has normal motor skills, normal sensation, normal strength and normal reflexes. Gait normal.  Skin: Skin is warm and dry.  Psychiatric: Mood, memory, affect and judgment normal.    Assessment and Plan :  Annual physical exam  Encounter for screening mammogram for breast cancer - Plan: MM Digital Screening  Pt to get me her lab results for my review.  She is to f/u with her GYN for gyn exams - We talked at length about her anxiety and possible treatments and triggers.  We discussed therapy as being a good starting point for her anxiety.  She might need medications but at this point they are not indicated and she is not interested.  She states that she is more comfortable with it since we discussed that it  could be linked to her hormones.  She will f/u with me as needed.  Windell Hummingbird PA-C  [facility] 01/29/2015 6:34 PM

## 2015-02-03 ENCOUNTER — Other Ambulatory Visit: Payer: Self-pay

## 2015-02-03 DIAGNOSIS — Z1231 Encounter for screening mammogram for malignant neoplasm of breast: Secondary | ICD-10-CM

## 2015-02-05 ENCOUNTER — Ambulatory Visit
Admission: RE | Admit: 2015-02-05 | Discharge: 2015-02-05 | Disposition: A | Discharge status: Home / Self Care | Payer: 59 | Source: Ambulatory Visit | Attending: Physician Assistant | Admitting: Physician Assistant

## 2015-02-05 DIAGNOSIS — Z1231 Encounter for screening mammogram for malignant neoplasm of breast: Secondary | ICD-10-CM

## 2015-06-08 ENCOUNTER — Ambulatory Visit (INDEPENDENT_AMBULATORY_CARE_PROVIDER_SITE_OTHER): Payer: 59 | Admitting: Family Medicine

## 2015-06-08 VITALS — BP 122/80 | HR 74 | Temp 98.6°F | Resp 18 | Wt 209.4 lb

## 2015-06-08 DIAGNOSIS — J029 Acute pharyngitis, unspecified: Secondary | ICD-10-CM | POA: Diagnosis not present

## 2015-06-08 LAB — POCT RAPID STREP A (OFFICE): RAPID STREP A SCREEN: NEGATIVE

## 2015-06-08 NOTE — Progress Notes (Signed)
Subjective:    Patient ID: Erin Zamora, female    DOB: 06-04-1970, 45 y.o.   MRN: [id number] By signing my name below, I, Zola Button, attest that this documentation has been prepared under the direction and in the presence of Merri Ray, MD.  Electronically Signed: Zola Button, Medical Scribe. 06/08/2015. 2:11 PM.  HPI HPI Comments: Erin Zamora is a 45 y.o. female who presents to the [facility] complaining of gradual onset sore throat that started 2 days ago. She has pain with swallowing. Patient reports having associated sneezing and generalized myalgias. She has been using cough drops and Tylenol Cold & Flu, but without relief. She has a 44 year old and a pre-K granddaughter, both with sore throats. Patient denies fever and cough.   There are no active problems to display for this patient.  Past Medical History  Diagnosis Date  . Anemia   . Allergy     seasonal  . Anxiety    Past Surgical History  Procedure Laterality Date  . Appendectomy  1991  . Cholecystectomy  2003  . Tubal ligation  2003   Allergies  Allergen Reactions  . Codeine Itching and Nausea And Vomiting   Prior to Admission medications   Not on File   Social History   Social History  . Marital Status: Single    Spouse Name: N/A  . Number of Children: N/A  . Years of Education: N/A   Occupational History  . Not on file.   Social History Main Topics  . Smoking status: Former Smoker    Quit date: 02/05/2012  . Smokeless tobacco: Never Used  . Alcohol Use: 0.0 oz/week    0 Standard drinks or equivalent per week  . Drug Use: No  . Sexual Activity:    Partners: Male    Birth Control/ Protection: None   Other Topics Concern  . Not on file   Social History Narrative   44 year old and 29 y/o daughters   108 year old granddaughter - takes care of her in the afternoon after school   Significant other - 15 years   Work: Web designer          Review of Systems  Constitutional: Negative for fever.  HENT: Positive for sneezing and sore throat.   Respiratory: Negative for cough.   Musculoskeletal: Positive for myalgias.       Objective:   Physical Exam  Constitutional: She is oriented to person, place, and time. She appears well-developed and well-nourished. No distress.  HENT:  Head: Normocephalic and atraumatic.  Right Ear: Hearing, tympanic membrane, external ear and ear canal normal.  Left Ear: Hearing, tympanic membrane, external ear and ear canal normal.  Nose: Right sinus exhibits no maxillary sinus tenderness. Left sinus exhibits maxillary sinus tenderness.  Mouth/Throat: Posterior oropharyngeal erythema present. No oropharyngeal exudate.  Minimal erythema in the oropharynx. Moist oral mucosa. Slight discomfort left maxillary sinus, right non-tender. Clear to white nasal congestion, right side.  Eyes: Conjunctivae and EOM are normal. Pupils are equal, round, and reactive to light.  Neck:  Tender along AC nodes, but not enlarged.  Cardiovascular: Normal rate, regular rhythm, normal heart sounds and intact distal pulses.   No murmur heard. Pulmonary/Chest: Effort normal and breath sounds normal. No respiratory distress. She has no wheezes. She has no rhonchi.  Clear to auscultation bilaterally.   Neurological: She is alert and oriented to person, place, and time.  Skin: Skin is warm and  dry. No rash noted.  Psychiatric: She has a normal mood and affect. Her behavior is normal.  Vitals reviewed.   Filed Vitals:   06/08/15 1352  BP: 122/80  Pulse: 74  Temp: 98.6 F (37 C)  TempSrc: Oral  Resp: 18  Weight: 209 lb 6.4 oz (94.983 kg)  SpO2: 98%    Results for orders placed or performed in visit on 06/08/15  POCT rapid strep A  Result Value Ref Range   Rapid Strep A Screen Negative Negative        Assessment & Plan:   Erin Zamora is a 45 y.o. female Sore throat - Plan: POCT rapid strep A,  Culture, Group A Strep Suspected viral syndrome, early upper respiratory infection likely.  Culture was checked, but unlikely strep. Symptomatic care discussed, RTC precautions.  No orders of the defined types were placed in this encounter.   Patient Instructions  You should receive a call or letter about your lab results within the next week to 10 days.   cepacol or other cough drops as needed, advil or alleve as needed for sore throat.  Return to the clinic or go to the nearest emergency room if any of your symptoms worsen or new symptoms occur.  Sore Throat A sore throat is pain, burning, irritation, or scratchiness of the throat. There is often pain or tenderness when swallowing or talking. A sore throat may be accompanied by other symptoms, such as coughing, sneezing, fever, and swollen neck glands. A sore throat is often the first sign of another sickness, such as a cold, flu, strep throat, or mononucleosis (commonly known as mono). Most sore throats go away without medical treatment. CAUSES  The most common causes of a sore throat include:  A viral infection, such as a cold, flu, or mono.  A bacterial infection, such as strep throat, tonsillitis, or whooping cough.  Seasonal allergies.  Dryness in the air.  Irritants, such as smoke or pollution.  Gastroesophageal reflux disease (GERD). HOME CARE INSTRUCTIONS   Only take over-the-counter medicines as directed by your caregiver.  Drink enough fluids to keep your urine clear or pale yellow.  Rest as needed.  Try using throat sprays, lozenges, or sucking on hard candy to ease any pain (if older than 4 years or as directed).  Sip warm liquids, such as broth, herbal tea, or warm water with honey to relieve pain temporarily. You may also eat or drink cold or frozen liquids such as frozen ice pops.  Gargle with salt water (mix 1 tsp salt with 8 oz of water).  Do not smoke and avoid secondhand smoke.  Put a cool-mist  humidifier in your bedroom at night to moisten the air. You can also turn on a hot shower and sit in the bathroom with the door closed for 5-10 minutes. SEEK IMMEDIATE MEDICAL CARE IF:  You have difficulty breathing.  You are unable to swallow fluids, soft foods, or your saliva.  You have increased swelling in the throat.  Your sore throat does not get better in 7 days.  You have nausea and vomiting.  You have a fever or persistent symptoms for more than 2-3 days.  You have a fever and your symptoms suddenly get worse. MAKE SURE YOU:   Understand these instructions.  Will watch your condition.  Will get help right away if you are not doing well or get worse.   This information is not intended to replace advice given to you by  your health care provider. Make sure you discuss any questions you have with your health care provider.   Document Released: 05/26/2004 Document Revised: 05/09/2014 Document Reviewed: 12/25/2011 Elsevier Interactive Patient Education [employer].     I personally performed the services described in this documentation, which was scribed in my presence. The recorded information has been reviewed and considered, and addended by me as needed.

## 2015-06-08 NOTE — Patient Instructions (Signed)
You should receive a call or letter about your lab results within the next week to 10 days.   cepacol or other cough drops as needed, advil or alleve as needed for sore throat.  Return to the clinic or go to the nearest emergency room if any of your symptoms worsen or new symptoms occur.  Sore Throat A sore throat is pain, burning, irritation, or scratchiness of the throat. There is often pain or tenderness when swallowing or talking. A sore throat may be accompanied by other symptoms, such as coughing, sneezing, fever, and swollen neck glands. A sore throat is often the first sign of another sickness, such as a cold, flu, strep throat, or mononucleosis (commonly known as mono). Most sore throats go away without medical treatment. CAUSES  The most common causes of a sore throat include:  A viral infection, such as a cold, flu, or mono.  A bacterial infection, such as strep throat, tonsillitis, or whooping cough.  Seasonal allergies.  Dryness in the air.  Irritants, such as smoke or pollution.  Gastroesophageal reflux disease (GERD). HOME CARE INSTRUCTIONS   Only take over-the-counter medicines as directed by your caregiver.  Drink enough fluids to keep your urine clear or pale yellow.  Rest as needed.  Try using throat sprays, lozenges, or sucking on hard candy to ease any pain (if older than 4 years or as directed).  Sip warm liquids, such as broth, herbal tea, or warm water with honey to relieve pain temporarily. You may also eat or drink cold or frozen liquids such as frozen ice pops.  Gargle with salt water (mix 1 tsp salt with 8 oz of water).  Do not smoke and avoid secondhand smoke.  Put a cool-mist humidifier in your bedroom at night to moisten the air. You can also turn on a hot shower and sit in the bathroom with the door closed for 5-10 minutes. SEEK IMMEDIATE MEDICAL CARE IF:  You have difficulty breathing.  You are unable to swallow fluids, soft foods, or your  saliva.  You have increased swelling in the throat.  Your sore throat does not get better in 7 days.  You have nausea and vomiting.  You have a fever or persistent symptoms for more than 2-3 days.  You have a fever and your symptoms suddenly get worse. MAKE SURE YOU:   Understand these instructions.  Will watch your condition.  Will get help right away if you are not doing well or get worse.   This information is not intended to replace advice given to you by your health care provider. Make sure you discuss any questions you have with your health care provider.   Document Released: 05/26/2004 Document Revised: 05/09/2014 Document Reviewed: 12/25/2011 Elsevier Interactive Patient Education [employer].

## 2015-06-10 LAB — CULTURE, GROUP A STREP: ORGANISM ID, BACTERIA: NORMAL

## 2015-12-22 ENCOUNTER — Encounter: Payer: Self-pay | Admitting: Physician Assistant

## 2015-12-22 ENCOUNTER — Ambulatory Visit (INDEPENDENT_AMBULATORY_CARE_PROVIDER_SITE_OTHER): Payer: 59

## 2015-12-22 ENCOUNTER — Ambulatory Visit (INDEPENDENT_AMBULATORY_CARE_PROVIDER_SITE_OTHER): Payer: 59 | Admitting: Physician Assistant

## 2015-12-22 VITALS — BP 130/90 | HR 102 | Temp 98.0°F | Resp 17 | Ht 65.0 in | Wt 211.0 lb

## 2015-12-22 DIAGNOSIS — R229 Localized swelling, mass and lump, unspecified: Secondary | ICD-10-CM | POA: Diagnosis not present

## 2015-12-22 DIAGNOSIS — M791 Myalgia: Secondary | ICD-10-CM

## 2015-12-22 DIAGNOSIS — W108XXA Fall (on) (from) other stairs and steps, initial encounter: Secondary | ICD-10-CM

## 2015-12-22 DIAGNOSIS — IMO0002 Reserved for concepts with insufficient information to code with codable children: Secondary | ICD-10-CM

## 2015-12-22 DIAGNOSIS — M7918 Myalgia, other site: Secondary | ICD-10-CM

## 2015-12-22 DIAGNOSIS — R58 Hemorrhage, not elsewhere classified: Secondary | ICD-10-CM

## 2015-12-22 MED ORDER — TRAMADOL HCL 50 MG PO TABS: 50.0000 mg | ORAL_TABLET | Freq: Three times a day (TID) | ORAL | 0 refills | Status: DC | PRN

## 2015-12-22 NOTE — Progress Notes (Signed)
Erin Zamora  MRN: [id number] DOB: May 12, 1970  Subjective:  Pt presents to clinic with right sided hip and buttocks pain - 10 days ago she fell down 3 steps.  She slipped and flew over 3 steps landing on her right buttocks - she had immediate pain that lasted for about a week but each day it got a little less painful until 2 days ago when the pain changed and it started to hurt more and it has continued to worsen.  She is having no pain radiation or paresthesias.  She can feel 2 knots on her right buttocks and she just noticed them 2 days ago when her pain changed and they have at least doubled in size.  She had some increase pain this am in that location when she was walking and now it is hard to sit because of the pain associated with the bumps on her buttocks the pain is also radiating around her right hip/pelvis region.  She is having no urinary symptoms and other wise feels fine.  Having trouble getting comfortable.  She has tried some Tylenol and motrin neither of which is really helping with her pain.  Review of Systems  Constitutional: Negative for chills and fever.  Musculoskeletal: Positive for gait problem (2nd to pain).    There are no active problems to display for this patient.   No current outpatient prescriptions on file prior to visit.   No current facility-administered medications on file prior to visit.     Allergies  Allergen Reactions  . Codeine Itching and Nausea And Vomiting    Pt patients past, family and social history were reviewed and updated.  Objective:  BP 130/90 (BP Location: Left Arm, Patient Position: Sitting, Cuff Size: Normal)   Pulse (!) 102   Temp 98 F (36.7 C) (Oral)   Resp 17   Ht 5\' 5"  (1.651 m)   Wt 211 lb (95.7 kg)   LMP 12/08/2015   SpO2 96%   BMI 35.11 kg/m   Physical Exam  Constitutional: She is oriented to person, place, and time and well-developed, well-nourished, and in no distress.  HENT:  Head: Normocephalic  and atraumatic.  Right Ear: Hearing and external ear normal.  Left Ear: Hearing and external ear normal.  Eyes: Conjunctivae are normal.  Neck: Normal range of motion.  Pulmonary/Chest: Effort normal.  Musculoskeletal:       Right hip: She exhibits normal range of motion, normal strength and no bony tenderness.       Left hip: Normal.       Lumbar back: She exhibits decreased range of motion (due to pain in right buttocks) and tenderness (mild lower lumbar spine). She exhibits no spasm.       Legs: No TTP over coccyx  Neurological: She is alert and oriented to person, place, and time. Gait normal.  Skin: Skin is warm and dry.  Psychiatric: Mood, memory, affect and judgment normal.  Vitals reviewed.  Dg Pelvis 1-2 Views  Result Date: 12/22/2015 CLINICAL DATA:  [place] 10 days ago landing on the right buttocks with worsening pain EXAM: PELVIS - 1-2 VIEW COMPARISON:  CT abdomen pelvis of 02/18/2012 FINDINGS: No acute pelvic fracture is seen. The hip joint spaces are relatively normal for age. The pelvic rami are intact. The SI joints appear corticated. There is no plain film evidence of sacral fracture but if further assessment is warranted CT of the pelvis could be performed. IMPRESSION: No pelvic fracture is seen. Consider  CT of the pelvis if warranted clinically. Electronically Signed   By: Ivar Drape M.D.   On: 12/22/2015 16:24    Assessment and Plan :  Right buttock pain - Plan: DG Pelvis 1-2 Views, CT PELVIS WO CONTRAST, traMADol (ULTRAM) 50 MG tablet - concern for a fracture due to worsening pain -- and swelling CT has been ordered - this will allow [place] to determine what the swelling is and where there is a fracture.   Ecchymosis  Mass - Plan: CT PELVIS WO CONTRAST  Fall (on) (from) other stairs and steps, initial encounter - Plan: traMADol (ULTRAM) 50 MG tablet  D/w Dr Myriam Forehand PA-C  [facility] 12/22/2015 5:01 PM

## 2015-12-22 NOTE — Patient Instructions (Addendum)
Ice to the area Motrin 800mg  3x/day Start pain medications on top of that is needed  We will set up a CT scan of your pelvis to make sure there is not a fracture that would could not see on xray    IF you received an x-ray today, you will receive an invoice from [facility]. Please contact [facility] at [phone] with questions or concerns regarding your invoice.   IF you received labwork today, you will receive an invoice from [employer]. Please contact [facility] at [phone] with questions or concerns regarding your invoice.   Our billing staff will not be able to assist you with questions regarding bills from these companies.  You will be contacted with the lab results as soon as they are available. The fastest way to get your results is to activate your My Chart account. Instructions are located on the last page of this paperwork. If you have not heard from [place] regarding the results in 2 weeks, please contact this office.

## 2016-10-23 ENCOUNTER — Ambulatory Visit (HOSPITAL_COMMUNITY): Admission: EM | Admit: 2016-10-23 | Discharge: 2016-10-23 | Discharge status: Against Medical Advice | Payer: 59

## 2016-10-23 DIAGNOSIS — M5442 Lumbago with sciatica, left side: Secondary | ICD-10-CM | POA: Diagnosis not present

## 2018-01-29 ENCOUNTER — Emergency Department (HOSPITAL_COMMUNITY): Payer: 59

## 2018-01-29 ENCOUNTER — Encounter (HOSPITAL_COMMUNITY): Payer: Self-pay | Admitting: Emergency Medicine

## 2018-01-29 ENCOUNTER — Emergency Department (HOSPITAL_COMMUNITY)
Admission: EM | Admit: 2018-01-29 | Discharge: 2018-01-30 | Disposition: A | Discharge status: Home / Self Care | Payer: 59 | Attending: Emergency Medicine | Admitting: Emergency Medicine

## 2018-01-29 DIAGNOSIS — R0602 Shortness of breath: Secondary | ICD-10-CM | POA: Diagnosis not present

## 2018-01-29 DIAGNOSIS — Z79899 Other long term (current) drug therapy: Secondary | ICD-10-CM | POA: Insufficient documentation

## 2018-01-29 DIAGNOSIS — I10 Essential (primary) hypertension: Secondary | ICD-10-CM | POA: Diagnosis not present

## 2018-01-29 DIAGNOSIS — J811 Chronic pulmonary edema: Secondary | ICD-10-CM | POA: Diagnosis not present

## 2018-01-29 DIAGNOSIS — R609 Edema, unspecified: Secondary | ICD-10-CM | POA: Diagnosis not present

## 2018-01-29 DIAGNOSIS — R2231 Localized swelling, mass and lump, right upper limb: Secondary | ICD-10-CM | POA: Diagnosis present

## 2018-01-29 DIAGNOSIS — R079 Chest pain, unspecified: Secondary | ICD-10-CM | POA: Diagnosis not present

## 2018-01-29 DIAGNOSIS — Z87891 Personal history of nicotine dependence: Secondary | ICD-10-CM | POA: Diagnosis not present

## 2018-01-29 NOTE — ED Triage Notes (Signed)
Pt presents with R arm swelling that began Friday, pain now radiating into R axilla, R chest, and R sided neck; also with L leg/calf swelling that began about the same time that is worse with walking; denies hx of blood clots herself but states they run in her family; has not noticed any breathing difficulty

## 2018-01-30 ENCOUNTER — Ambulatory Visit (HOSPITAL_BASED_OUTPATIENT_CLINIC_OR_DEPARTMENT_OTHER)
Admission: RE | Admit: 2018-01-30 | Discharge: 2018-01-30 | Disposition: A | Discharge status: Home / Self Care | Payer: 59 | Source: Ambulatory Visit | Attending: Emergency Medicine | Admitting: Emergency Medicine

## 2018-01-30 DIAGNOSIS — M7989 Other specified soft tissue disorders: Secondary | ICD-10-CM

## 2018-01-30 LAB — I-STAT BETA HCG BLOOD, ED (MC, WL, AP ONLY): I-stat hCG, quantitative: <5 m[IU]/mL (ref <5)

## 2018-01-30 LAB — CBC
HCT: 34.9 % — ABNORMAL LOW (ref 36.0–46.0)
Hemoglobin: 10.1 g/dL — ABNORMAL LOW (ref 12.0–15.0)
MCH: 21.3 pg — AB (ref 26.0–34.0)
MCHC: 28.9 g/dL — AB (ref 30.0–36.0)
MCV: 73.6 fL — AB (ref 78.0–100.0)
PLATELETS: 671 10*3/uL — AB (ref 150–400)
RBC: 4.74 MIL/uL (ref 3.87–5.11)
RDW: 18.6 % — AB (ref 11.5–15.5)
WBC: 12.4 10*3/uL — ABNORMAL HIGH (ref 4.0–10.5)

## 2018-01-30 LAB — BASIC METABOLIC PANEL
Anion gap: 10 (ref 5–15)
BUN: 7 mg/dL (ref 6–20)
CALCIUM: 9.3 mg/dL (ref 8.9–10.3)
CHLORIDE: 102 mmol/L (ref 98–111)
CO2: 26 mmol/L (ref 22–32)
CREATININE: 0.76 mg/dL (ref 0.44–1.00)
GFR calc Af Amer: >60 mL/min (ref >60)
GFR calc non Af Amer: >60 mL/min (ref >60)
Glucose, Bld: 110 mg/dL — ABNORMAL HIGH (ref 70–99)
Potassium: 3.3 mmol/L — ABNORMAL LOW (ref 3.5–5.1)
SODIUM: 138 mmol/L (ref 135–145)

## 2018-01-30 LAB — I-STAT TROPONIN, ED: Troponin i, poc: 0 ng/mL (ref 0.00–0.08)

## 2018-01-30 LAB — D-DIMER, QUANTITATIVE: D-Dimer, Quant: 0.45 ug/mL-FEU (ref 0.00–0.50)

## 2018-01-30 MED ORDER — ENOXAPARIN SODIUM 150 MG/ML ~~LOC~~ SOLN
150.0000 mg | Freq: Once | SUBCUTANEOUS | Status: AC
  Administered 2018-01-30: 150 mg via SUBCUTANEOUS
  Filled 2018-01-30: qty 1

## 2018-01-30 NOTE — ED Provider Notes (Signed)
[facility] EMERGENCY DEPARTMENT Provider Note   CSN: [id number] Arrival date & time: 01/29/18  2100     History   Chief Complaint Chief Complaint  Patient presents with  . Chest Pain  . Arm Pain  . Leg Pain  . Leg Swelling    HPI Erin Zamora is a 47 y.o. female.  47 year old female presents to the emergency department for evaluation of multiple complaints.  She has been experiencing some pain to her central chest intermittently for the past month.  She describes the pain as dull.  She is unable to note any aggravating or alleviating factors of the symptoms.  She began to notice swelling to her right upper extremity 4 days ago.  This has extended to include pain in her right upper chest.  She notes some aggravation with movement of her right arm.  Also complaining of some paresthesias to her distal right upper extremity.  She felt as though she noticed some swelling in her left leg and calf as well.  This is associated with some soreness with ambulation.  She denies taking any medications for her symptoms.  Reports family history of VTE in her father and grandfather.  Denies shortness of breath, fever, syncope, extremity weakness.     Past Medical History:  Diagnosis Date  . Allergy    seasonal  . Anemia   . Anxiety     There are no active problems to display for this patient.   Past Surgical History:  Procedure Laterality Date  . APPENDECTOMY  1991  . CHOLECYSTECTOMY  2003  . TUBAL LIGATION  2003     OB History   None      Home Medications    Prior to Admission medications   Medication Sig Start Date End Date Taking? Authorizing Provider  acetaminophen (TYLENOL) 325 MG tablet Take 650 mg by mouth every 6 (six) hours as needed.    [provider]  traMADol (ULTRAM) 50 MG tablet Take 1 tablet (50 mg total) by mouth every 8 (eight) hours as needed. 12/22/15   Gale Journey, Damaris Hippo, PA-C    Family History Family History  Problem  Relation Age of Onset  . Alcohol abuse Mother   . COPD Mother   . Hypertension Mother   . Alcohol abuse Father   . Diabetes Father   . Drug abuse Father   . Heart disease Father   . Hypertension Father   . Kidney disease Father   . Arthritis Maternal Grandmother   . Arthritis Maternal Grandfather   . Cancer Paternal Grandmother   . Diabetes Paternal Grandmother   . Heart disease Paternal Grandmother   . Diabetes Paternal Grandfather   . Heart disease Paternal Grandfather   . Kidney disease Paternal Grandfather   . Stroke Paternal Grandfather     Social History Social History   Tobacco Use  . Smoking status: Former Smoker    Last attempt to quit: 02/05/2012    Years since quitting: 5.9  . Smokeless tobacco: Never Used  Substance Use Topics  . Alcohol use: Yes    Alcohol/week: 0.0 standard drinks  . Drug use: No     Allergies   Codeine   Review of Systems Review of Systems Ten systems reviewed and are negative for acute change, except as noted in the HPI.    Physical Exam Updated Vital Signs BP (!) 143/95   Pulse 79   Temp 98.9 F (37.2 C) (Oral)   Resp  16   Ht 5\' 5"  (1.651 m)   LMP 01/12/2018   SpO2 97%   BMI 35.11 kg/m   Physical Exam  Constitutional: She is oriented to person, place, and time. She appears well-developed and well-nourished. No distress.  Nontoxic appearing and in NAD; obese.  HENT:  Head: Normocephalic and atraumatic.  Eyes: Conjunctivae and EOM are normal. No scleral icterus.  Neck: Normal range of motion.  No JVD  Cardiovascular: Normal rate, regular rhythm and intact distal pulses.  Pulmonary/Chest: Effort normal. No stridor. No respiratory distress. She has no wheezes.  Respirations even and unlabored.  Lungs clear bilaterally.  Musculoskeletal: Normal range of motion.  Normal ROM of BUE. No significant BLE edema. Compartments soft in all extremities.  Neurological: She is alert and oriented to person, place, and time. She  exhibits normal muscle tone. Coordination normal.  Skin: Skin is warm and dry. No rash noted. She is not diaphoretic. No erythema. No pallor.  Psychiatric: She has a normal mood and affect. Her behavior is normal.  Nursing note and vitals reviewed.    ED Treatments / Results  Labs (all labs ordered are listed, but only abnormal results are displayed) Labs Reviewed  BASIC METABOLIC PANEL - Abnormal; Notable for the following components:      Result Value   Potassium 3.3 (*)    Glucose, Bld 110 (*)    All other components within normal limits  CBC - Abnormal; Notable for the following components:   WBC 12.4 (*)    Hemoglobin 10.1 (*)    HCT 34.9 (*)    MCV 73.6 (*)    MCH 21.3 (*)    MCHC 28.9 (*)    RDW 18.6 (*)    Platelets 671 (*)    All other components within normal limits  D-DIMER, QUANTITATIVE (NOT AT [facility])  I-STAT TROPONIN, ED  I-STAT BETA HCG BLOOD, ED ([facility], [facility], [facility] ONLY)    EKG ED ECG REPORT   Date: 01/30/2018  Rate: 94  Rhythm: normal sinus rhythm  QRS Axis: normal  Intervals: normal  ST/T Wave abnormalities: normal  Conduction Disutrbances:none  Narrative Interpretation: Normal sinus rhythm  Old EKG Reviewed: none available  I have personally reviewed the EKG tracing and agree with the computerized printout as noted.   Radiology Dg Chest 2 View  Result Date: 01/29/2018 CLINICAL DATA:  Chest pain, shortness of breath EXAM: CHEST - 2 VIEW COMPARISON:  08/24/2010 FINDINGS: Lungs are clear.  No pleural effusion or pneumothorax. The heart is normal in size. Visualized osseous structures are within normal limits. IMPRESSION: Normal chest radiographs. Electronically Signed   By: Julian Hy M.D.   On: 01/29/2018 21:41    Procedures Procedures (including critical care time)  Medications Ordered in ED Medications  enoxaparin (LOVENOX) injection 150 mg (150 mg Subcutaneous Given 01/30/18 0347)     Initial Impression / Assessment and Plan / ED Course    I have reviewed the triage vital signs and the nursing notes.  Pertinent labs & imaging results that were available during my care of the patient were reviewed by me and considered in my medical decision making (see chart for details).     47 year old female presents to the emergency department for complaints of swelling to her right upper extremity as well as her left lower extremity x 4 days.  Times preceded by intermittent central chest discomfort for 1 month.  Patient neurovascularly intact with stable vital signs.  Cardiac work-up is reassuring with nonischemic  EKG and negative troponin.  Chest x-ray without evidence of acute cardiopulmonary abnormality.  She has a leukocytosis today which appears similar to baseline.  Slightly worsened anemia compared to prior, but not enough to explain the patient's symptoms.  She expresses concern for blood clot given family history.  A d-dimer was performed which is negative.  Low suspicion for pulmonary embolus, especially given lack of tachycardia, tachypnea, dyspnea, hypoxia.  Unable to fully exclude the possibility of DVT.  For this reason, the patient was given a dose of Lovenox in the emergency department.  Will proceed with outpatient ultrasound of both the right arm and left leg.  Encouraged outpatient primary care follow up for repeat assessment.  Return precautions discussed and provided. Patient discharged in stable condition with no unaddressed concerns.   Final Clinical Impressions(s) / ED Diagnoses   Final diagnoses:  Peripheral edema    ED Discharge Orders         Ordered    UE VENOUS DUPLEX     01/30/18 0324    LE VENOUS     01/30/18 0324           Antonietta Breach, PA-C 01/30/18 1840    Merryl Hacker, MD 01/30/18 [phone]

## 2018-01-30 NOTE — ED Notes (Signed)
E-signature not available, pt verbalized understanding of DC instructions  

## 2018-01-30 NOTE — Discharge Instructions (Signed)
You have been treated with Lovenox today which is a blood thinner.  This will appropriately manage any blood clot if it is the underlying cause of your symptoms.  We advise you to return later this morning to have ultrasounds of your right arm and left leg completed.  If positive for a DVT, you will be directed back to the emergency department for prescription of a blood thinner.  If negative, we advise use of ibuprofen and icing as well as follow-up with your primary doctor.  You may return to the ED for any other new or concerning symptoms.

## 2018-01-30 NOTE — Progress Notes (Addendum)
Right upper extremity venous duplex has been completed. Negative for DVT.  Left lower extremity venous duplex has been completed. Negative for DVT.  01/30/18 9:53 AM Carlos Levering RVT

## 2018-01-30 NOTE — ED Notes (Signed)
Pt aware waiting for Lovenox from main pharmacy then will be DC

## 2018-02-02 ENCOUNTER — Encounter (HOSPITAL_COMMUNITY): Payer: Self-pay

## 2018-02-02 ENCOUNTER — Ambulatory Visit (HOSPITAL_COMMUNITY)
Admission: EM | Admit: 2018-02-02 | Discharge: 2018-02-02 | Disposition: A | Discharge status: Home / Self Care | Payer: 59 | Attending: Family Medicine | Admitting: Family Medicine

## 2018-02-02 ENCOUNTER — Other Ambulatory Visit: Payer: Self-pay

## 2018-02-02 DIAGNOSIS — M5412 Radiculopathy, cervical region: Secondary | ICD-10-CM | POA: Diagnosis not present

## 2018-02-02 MED ORDER — PREDNISONE 5 MG PO TABS: ORAL_TABLET | ORAL | 0 refills | Status: DC

## 2018-02-02 NOTE — Discharge Instructions (Signed)
Nice to meet you  Please try the medication  Please work on your posture at work  Please try to limit your sitting  Please follow up if your symptoms do not seem to improve.

## 2018-02-02 NOTE — ED Provider Notes (Signed)
[facility]    CSN: [id number] Arrival date & time: 02/02/18  1843     History   Chief Complaint Chief Complaint  Patient presents with  . Arm Pain    HPI Erin Zamora is a 47 y.o. female.   Presenting with right arm pain for little over a week.  She denies any inciting event.  The pain is occurring on the posterior aspect of her arm.  It radiates from her neck down to the dorsal aspect of her hand.  Pain is staying in nature.  She has been seen in the emergency department and had a negative cardiac work-up.  She is also had a negative d-dimer.  She works at a desk most all day.  She also has complained about several of her joints being painful.  No prior history of similar pain.   Review of the upper extremity Doppler shows no evidence of a thrombosis from 10/1.  HPI  Past Medical History:  Diagnosis Date  . Allergy    seasonal  . Anemia   . Anxiety     There are no active problems to display for this patient.   Past Surgical History:  Procedure Laterality Date  . APPENDECTOMY  1991  . CHOLECYSTECTOMY  2003  . TUBAL LIGATION  2003    OB History   None      Home Medications    Prior to Admission medications   Medication Sig Start Date End Date Taking? Authorizing Provider  acetaminophen (TYLENOL) 325 MG tablet Take 650 mg by mouth every 6 (six) hours as needed.    [provider]  predniSONE (DELTASONE) 5 MG tablet Take 6 pills for first day, 5 pills second day, 4 pills third day, 3 pills fourth day, 2 pills the fifth day, and 1 pill sixth day. 02/02/18   Rosemarie Ax, MD  traMADol (ULTRAM) 50 MG tablet Take 1 tablet (50 mg total) by mouth every 8 (eight) hours as needed. 12/22/15   Gale Journey, Damaris Hippo, PA-C    Family History Family History  Problem Relation Age of Onset  . Alcohol abuse Mother   . COPD Mother   . Hypertension Mother   . Alcohol abuse Father   . Diabetes Father   . Drug abuse Father   . Heart disease Father     . Hypertension Father   . Kidney disease Father   . Arthritis Maternal Grandmother   . Arthritis Maternal Grandfather   . Cancer Paternal Grandmother   . Diabetes Paternal Grandmother   . Heart disease Paternal Grandmother   . Diabetes Paternal Grandfather   . Heart disease Paternal Grandfather   . Kidney disease Paternal Grandfather   . Stroke Paternal Grandfather     Social History Social History   Tobacco Use  . Smoking status: Former Smoker    Last attempt to quit: 02/05/2012    Years since quitting: 5.9  . Smokeless tobacco: Never Used  Substance Use Topics  . Alcohol use: Yes    Alcohol/week: 0.0 standard drinks  . Drug use: No     Allergies   Codeine   Review of Systems Review of Systems  Constitutional: Negative for fever.  HENT: Negative for congestion.   Cardiovascular: Negative for chest pain.  Gastrointestinal: Negative for abdominal pain.  Musculoskeletal: Positive for neck pain. Negative for gait problem.  Skin: Negative for color change.  Neurological: Negative for weakness.  Hematological: Negative for adenopathy.  Psychiatric/Behavioral: Negative for agitation.  Physical Exam Triage Vital Signs ED Triage Vitals  Enc Vitals Group     BP 02/02/18 1858 (!) 147/96     Pulse Rate 02/02/18 1858 87     Resp 02/02/18 1858 18     Temp 02/02/18 1858 98.2 F (36.8 C)     Temp src --      SpO2 02/02/18 1858 98 %     Weight 02/02/18 1902 230 lb (104.3 kg)     Height --      Head Circumference --      Peak Flow --      Pain Score --      Pain Loc --      Pain Edu? --      Excl. in [place]? --    No data found.  Updated Vital Signs BP (!) 147/96 (BP Location: Left Arm)   Pulse 87   Temp 98.2 F (36.8 C)   Resp 18   Wt 104.3 kg   LMP 01/12/2018   SpO2 98%   BMI 38.27 kg/m   Visual Acuity Right Eye Distance:   Left Eye Distance:   Bilateral Distance:    Right Eye Near:   Left Eye Near:    Bilateral Near:     Physical Exam Gen:  NAD, alert, cooperative with exam, well-appearing ENT: normal lips, normal nasal mucosa,  Eye: normal EOM, normal conjunctiva and lids CV:  no edema, +2 pedal pulses   Resp: no accessory muscle use, non-labored,  Skin: no rashes, no areas of induration  Neuro: normal tone, normal sensation to touch Psych:  normal insight, alert and oriented MSK:  Neck/Right arm:  Normal neck range of motion. Normal strength resistance with shrug. Normal right shoulder range of motion. No signs of atrophy. Normal strength resistance with thumb extension, finger abduction and abduction, and pincer grasp. Normal grip strength. Neurovascularly intact  UC Treatments / Results  Labs (all labs ordered are listed, but only abnormal results are displayed) Labs Reviewed - No data to display  EKG None  Radiology No results found.  Procedures Procedures (including critical care time)  Medications Ordered in UC Medications - No data to display  Initial Impression / Assessment and Plan / UC Course  I have reviewed the triage vital signs and the nursing notes.  Pertinent labs & imaging results that were available during my care of the patient were reviewed by me and considered in my medical decision making (see chart for details).      Her symptoms are most likely associated with cervical radiculopathy.  Has had a negative cardiac work-up and all of her work-up for a blood clot has been negative.  She does work at a desk all day so most likely her symptoms are associated with her posture.  Will prescribe prednisone and counseled on home exercise therapy.  Also counseled on her posture at work.  If she denies any improvement they can consider gabapentin and physical therapy.   Final Clinical Impressions(s) / UC Diagnoses   Final diagnoses:  Cervical radiculitis     Discharge Instructions     Nice to meet you  Please try the medication  Please work on your posture at work  Please try to limit  your sitting  Please follow up if your symptoms do not seem to improve.     ED Prescriptions    Medication Sig Dispense Auth. Provider   predniSONE (DELTASONE) 5 MG tablet Take 6 pills for first day, 5 pills  second day, 4 pills third day, 3 pills fourth day, 2 pills the fifth day, and 1 pill sixth day. 21 tablet Rosemarie Ax, MD     Controlled Substance Prescriptions [place] Controlled Substance Registry consulted? Not Applicable   Rosemarie Ax, MD 02/02/18 1950

## 2018-02-02 NOTE — ED Triage Notes (Signed)
Pt states she was in the ER on Monday for this right arm pain and swelling X 5 days

## 2018-02-20 ENCOUNTER — Encounter (HOSPITAL_COMMUNITY): Payer: Self-pay | Admitting: Emergency Medicine

## 2018-02-20 ENCOUNTER — Ambulatory Visit (HOSPITAL_COMMUNITY)
Admission: EM | Admit: 2018-02-20 | Discharge: 2018-02-20 | Disposition: A | Discharge status: Home / Self Care | Payer: 59 | Attending: Family Medicine | Admitting: Family Medicine

## 2018-02-20 DIAGNOSIS — K1120 Sialoadenitis, unspecified: Secondary | ICD-10-CM

## 2018-02-20 MED ORDER — AMOXICILLIN-POT CLAVULANATE 875-125 MG PO TABS: 1.0000 | ORAL_TABLET | Freq: Two times a day (BID) | ORAL | 0 refills | Status: DC

## 2018-02-20 NOTE — ED Triage Notes (Signed)
PT developed swelling to left side of face last night. No airway involvement.

## 2018-02-20 NOTE — ED Provider Notes (Signed)
Patient: Erin Zamora MRN: [id number] DOB: 1970/05/26 PCP: Rennis Golden     Subjective:  Chief Complaint  Patient presents with  . Facial Swelling    HPI: The patient is a 47 y.o. female who presents today for left sided facial swelling. She states symptoms started last night while dropping her daughter off at college. She states the left side of her face hurt and it felt swollen. She noticed a large swelling in front of her ear. It hurts to open/close her mouth and chew. It is hard to the touch. It hurts up into her temple. She has had no fever, but has fatigue all of the time for years. She has had the MMR. She had as a child and thinks she had a booster in her 52s. She has had no exposure to anyone with mumps. She has poor dentition with infections. No anticholinergic drugs.   Review of Systems  Constitutional: Negative for chills and fever.  HENT: Positive for ear pain (burning in left ear ) and facial swelling. Negative for congestion, rhinorrhea, sinus pressure, sinus pain, sore throat and trouble swallowing.        Pain with chewing   Eyes: Negative for visual disturbance.  Respiratory: Negative for cough and shortness of breath.   Cardiovascular: Negative for chest pain.  Gastrointestinal: Positive for diarrhea. Negative for abdominal pain and nausea.    Allergies Patient is allergic to codeine.  Past Medical History Patient  has a past medical history of Allergy, Anemia, and Anxiety.  Surgical History Patient  has a past surgical history that includes Appendectomy (1991); Cholecystectomy (2003); and Tubal ligation (2003).  Family History Pateint's family history includes Alcohol abuse in her father and mother; Arthritis in her maternal grandfather and maternal grandmother; COPD in her mother; Cancer in her paternal grandmother; Diabetes in her father, paternal grandfather, and paternal grandmother; Drug abuse in her father; Heart disease in her father,  paternal grandfather, and paternal grandmother; Hypertension in her father and mother; Kidney disease in her father and paternal grandfather; Stroke in her paternal grandfather.  Social History Patient  reports that she quit smoking about 6 years ago. She has never used smokeless tobacco. She reports that she drinks alcohol. She reports that she does not use drugs.    Objective: Vitals:   02/20/18 1843 02/20/18 1845  BP:  124/89  Pulse: 87   Resp: 16   Temp: 98.9 F (37.2 C)   TempSrc: Oral   SpO2: 99%     There is no height or weight on file to calculate BMI.  Physical Exam  Constitutional: She appears well-developed and well-nourished.  HENT:  Right Ear: External ear normal.  Left Ear: External ear normal.  Nose: Nose normal.  Mouth/Throat: Oropharynx is clear and moist. No oropharyngeal exudate.  Tm pearly with light reflex bilaterally  Edematous and indurated parotid gland on the left. TTP.  Poor dentition   Eyes: Pupils are equal, round, and reactive to light.  Neck: Normal range of motion. Neck supple. No thyromegaly present.  Cardiovascular: Normal rate, regular rhythm and normal heart sounds.  Pulmonary/Chest: Effort normal and breath sounds normal.  Abdominal: Soft. Bowel sounds are normal.  Lymphadenopathy:    She has no cervical adenopathy.  Vitals reviewed.      Assessment/plan: The encounter diagnosis was Parotitis vs. Sialadenitis. No exposure to mumps/no fever thresh hold extremely low for this and discussed this with her.  Treating with course of augmentin, ibuprofen, warm compresses.  Strict precautions given to return to ER if fever/chills, worsening symptoms. Instructed on side effects of antibiotic and recommended probiotic with this.     Orma Flaming, MD  02/20/2018    Orma Flaming, MD 02/20/18 [phone]

## 2018-02-20 NOTE — Discharge Instructions (Signed)
Giving you antibiotic to take twice a day for 10 days. Please take with food/probiotic. Common to have diarrhea with this. If not better, fever/chills or increased swelling, please go to Er.

## 2018-10-01 DIAGNOSIS — G4700 Insomnia, unspecified: Secondary | ICD-10-CM

## 2018-10-01 DIAGNOSIS — J189 Pneumonia, unspecified organism: Secondary | ICD-10-CM

## 2018-10-01 DIAGNOSIS — R05 Cough: Secondary | ICD-10-CM

## 2018-10-01 DIAGNOSIS — E559 Vitamin D deficiency, unspecified: Secondary | ICD-10-CM | POA: Insufficient documentation

## 2018-10-01 DIAGNOSIS — E785 Hyperlipidemia, unspecified: Secondary | ICD-10-CM

## 2018-10-01 DIAGNOSIS — R059 Cough, unspecified: Secondary | ICD-10-CM

## 2018-10-01 HISTORY — DX: Cough: R05

## 2018-10-01 HISTORY — DX: Cough, unspecified: R05.9

## 2018-10-01 HISTORY — DX: Insomnia, unspecified: G47.00

## 2018-10-01 HISTORY — DX: Hyperlipidemia, unspecified: E78.5

## 2018-10-01 HISTORY — DX: Vitamin D deficiency, unspecified: E55.9

## 2018-10-01 HISTORY — DX: Pneumonia, unspecified organism: J18.9

## 2018-10-13 ENCOUNTER — Encounter (HOSPITAL_COMMUNITY): Payer: Self-pay | Admitting: Student

## 2018-10-13 ENCOUNTER — Emergency Department (HOSPITAL_COMMUNITY): Payer: 59

## 2018-10-13 ENCOUNTER — Other Ambulatory Visit: Payer: Self-pay

## 2018-10-13 ENCOUNTER — Inpatient Hospital Stay (HOSPITAL_COMMUNITY)
Admission: EM | Admit: 2018-10-13 | Discharge: 2018-10-15 | DRG: 189 | Disposition: A | Discharge status: Home / Self Care | Payer: 59 | Attending: Internal Medicine | Admitting: Internal Medicine

## 2018-10-13 DIAGNOSIS — R030 Elevated blood-pressure reading, without diagnosis of hypertension: Secondary | ICD-10-CM | POA: Diagnosis present

## 2018-10-13 DIAGNOSIS — J441 Chronic obstructive pulmonary disease with (acute) exacerbation: Secondary | ICD-10-CM | POA: Diagnosis present

## 2018-10-13 DIAGNOSIS — Z6837 Body mass index (BMI) 37.0-37.9, adult: Secondary | ICD-10-CM

## 2018-10-13 DIAGNOSIS — J440 Chronic obstructive pulmonary disease with acute lower respiratory infection: Secondary | ICD-10-CM | POA: Diagnosis present

## 2018-10-13 DIAGNOSIS — Z836 Family history of other diseases of the respiratory system: Secondary | ICD-10-CM

## 2018-10-13 DIAGNOSIS — E876 Hypokalemia: Secondary | ICD-10-CM | POA: Diagnosis not present

## 2018-10-13 DIAGNOSIS — Z8249 Family history of ischemic heart disease and other diseases of the circulatory system: Secondary | ICD-10-CM

## 2018-10-13 DIAGNOSIS — D509 Iron deficiency anemia, unspecified: Secondary | ICD-10-CM | POA: Diagnosis present

## 2018-10-13 DIAGNOSIS — J181 Lobar pneumonia, unspecified organism: Secondary | ICD-10-CM

## 2018-10-13 DIAGNOSIS — J9601 Acute respiratory failure with hypoxia: Secondary | ICD-10-CM | POA: Diagnosis not present

## 2018-10-13 DIAGNOSIS — Z20828 Contact with and (suspected) exposure to other viral communicable diseases: Secondary | ICD-10-CM | POA: Diagnosis present

## 2018-10-13 DIAGNOSIS — Z833 Family history of diabetes mellitus: Secondary | ICD-10-CM

## 2018-10-13 DIAGNOSIS — E669 Obesity, unspecified: Secondary | ICD-10-CM | POA: Diagnosis present

## 2018-10-13 DIAGNOSIS — E66811 Obesity, class 1: Secondary | ICD-10-CM | POA: Diagnosis present

## 2018-10-13 DIAGNOSIS — Z87891 Personal history of nicotine dependence: Secondary | ICD-10-CM

## 2018-10-13 DIAGNOSIS — J189 Pneumonia, unspecified organism: Secondary | ICD-10-CM | POA: Diagnosis present

## 2018-10-13 LAB — CBC WITH DIFFERENTIAL/PLATELET
Abs Immature Granulocytes: 0.06 10*3/uL (ref 0.00–0.07)
Basophils Absolute: 0.1 10*3/uL (ref 0.0–0.1)
Basophils Relative: 1 %
Eosinophils Absolute: 0.2 10*3/uL (ref 0.0–0.5)
Eosinophils Relative: 2 %
HCT: 30.8 % — ABNORMAL LOW (ref 36.0–46.0)
Hemoglobin: 10 g/dL — ABNORMAL LOW (ref 12.0–15.0)
Immature Granulocytes: 1 %
Lymphocytes Relative: 20 %
Lymphs Abs: 1.8 10*3/uL (ref 0.7–4.0)
MCH: 25.5 pg — ABNORMAL LOW (ref 26.0–34.0)
MCHC: 32.5 g/dL (ref 30.0–36.0)
MCV: 78.6 fL — ABNORMAL LOW (ref 80.0–100.0)
Monocytes Absolute: 0.8 10*3/uL (ref 0.1–1.0)
Monocytes Relative: 9 %
Neutro Abs: 6.4 10*3/uL (ref 1.7–7.7)
Neutrophils Relative %: 67 %
Platelets: 680 10*3/uL — ABNORMAL HIGH (ref 150–400)
RBC: 3.92 MIL/uL (ref 3.87–5.11)
RDW: 22 % — ABNORMAL HIGH (ref 11.5–15.5)
WBC: 9.4 10*3/uL (ref 4.0–10.5)
nRBC: 0 % (ref 0.0–0.2)

## 2018-10-13 LAB — COMPREHENSIVE METABOLIC PANEL
ALT: 18 U/L (ref 0–44)
AST: 22 U/L (ref 15–41)
Albumin: 3.8 g/dL (ref 3.5–5.0)
Alkaline Phosphatase: 63 U/L (ref 38–126)
Anion gap: 12 (ref 5–15)
BUN: 6 mg/dL (ref 6–20)
CO2: 22 mmol/L (ref 22–32)
Calcium: 9.1 mg/dL (ref 8.9–10.3)
Chloride: 103 mmol/L (ref 98–111)
Creatinine, Ser: 0.67 mg/dL (ref 0.44–1.00)
GFR calc Af Amer: >60 mL/min (ref >60)
GFR calc non Af Amer: >60 mL/min (ref >60)
Glucose, Bld: 86 mg/dL (ref 70–99)
Potassium: 3.5 mmol/L (ref 3.5–5.1)
Sodium: 137 mmol/L (ref 135–145)
Total Bilirubin: 0.6 mg/dL (ref 0.3–1.2)
Total Protein: 6.8 g/dL (ref 6.5–8.1)

## 2018-10-13 LAB — I-STAT BETA HCG BLOOD, ED (MC, WL, AP ONLY): I-stat hCG, quantitative: <5 m[IU]/mL (ref <5)

## 2018-10-13 LAB — FERRITIN: Ferritin: 3 ng/mL — ABNORMAL LOW (ref 11–307)

## 2018-10-13 LAB — PROCALCITONIN: Procalcitonin: <0.1 ng/mL

## 2018-10-13 LAB — FIBRINOGEN: Fibrinogen: 312 mg/dL (ref 210–475)

## 2018-10-13 LAB — URINALYSIS, ROUTINE W REFLEX MICROSCOPIC
Bilirubin Urine: NEGATIVE
Glucose, UA: NEGATIVE mg/dL
Hgb urine dipstick: NEGATIVE
Ketones, ur: NEGATIVE mg/dL
Leukocytes,Ua: NEGATIVE
Nitrite: NEGATIVE
Protein, ur: NEGATIVE mg/dL
Specific Gravity, Urine: 1.02 (ref 1.005–1.030)
pH: 5 (ref 5.0–8.0)

## 2018-10-13 LAB — TROPONIN I: Troponin I: <0.03 ng/mL (ref <0.03)

## 2018-10-13 LAB — LIPASE, BLOOD: Lipase: 28 U/L (ref 11–51)

## 2018-10-13 LAB — SARS CORONAVIRUS 2: SARS Coronavirus 2: NOT DETECTED

## 2018-10-13 LAB — C-REACTIVE PROTEIN: CRP: <0.8 mg/dL (ref <1.0)

## 2018-10-13 LAB — LACTATE DEHYDROGENASE: LDH: 167 U/L (ref 98–192)

## 2018-10-13 LAB — D-DIMER, QUANTITATIVE: D-Dimer, Quant: 0.33 ug/mL-FEU (ref 0.00–0.50)

## 2018-10-13 MED ORDER — SODIUM CHLORIDE 0.9% FLUSH
3.0000 mL | Freq: Two times a day (BID) | INTRAVENOUS | Status: DC
  Administered 2018-10-13 – 2018-10-15 (×4): 3 mL via INTRAVENOUS

## 2018-10-13 MED ORDER — SODIUM CHLORIDE 0.9 % IV SOLN
3.0000 g | Freq: Four times a day (QID) | INTRAVENOUS | Status: DC
  Administered 2018-10-13 – 2018-10-15 (×6): 3 g via INTRAVENOUS
  Filled 2018-10-13 (×11): qty 3

## 2018-10-13 MED ORDER — SODIUM CHLORIDE 0.9 % IV SOLN
INTRAVENOUS | Status: DC | PRN
  Administered 2018-10-13: 21:00:00 via INTRAVENOUS

## 2018-10-13 MED ORDER — ACETAMINOPHEN 325 MG PO TABS
650.0000 mg | ORAL_TABLET | Freq: Once | ORAL | Status: AC
  Administered 2018-10-13: 650 mg via ORAL
  Filled 2018-10-13: qty 2

## 2018-10-13 MED ORDER — SODIUM CHLORIDE 0.9 % IV SOLN
500.0000 mg | INTRAVENOUS | Status: DC
  Administered 2018-10-14: 500 mg via INTRAVENOUS
  Filled 2018-10-13 (×2): qty 500

## 2018-10-13 MED ORDER — SODIUM CHLORIDE 0.9 % IV SOLN
500.0000 mg | Freq: Once | INTRAVENOUS | Status: AC
  Administered 2018-10-13: 500 mg via INTRAVENOUS
  Filled 2018-10-13: qty 500

## 2018-10-13 MED ORDER — SODIUM CHLORIDE 0.9 % IV SOLN
1.0000 g | Freq: Once | INTRAVENOUS | Status: AC
  Administered 2018-10-13: 1 g via INTRAVENOUS
  Filled 2018-10-13: qty 10

## 2018-10-13 MED ORDER — BENZONATATE 100 MG PO CAPS
100.0000 mg | ORAL_CAPSULE | Freq: Two times a day (BID) | ORAL | Status: DC | PRN
  Administered 2018-10-13 – 2018-10-15 (×2): 100 mg via ORAL
  Filled 2018-10-13 (×3): qty 1

## 2018-10-13 MED ORDER — IOHEXOL 350 MG/ML SOLN
75.0000 mL | Freq: Once | INTRAVENOUS | Status: AC | PRN
  Administered 2018-10-13: 75 mL via INTRAVENOUS

## 2018-10-13 MED ORDER — ENOXAPARIN SODIUM 40 MG/0.4ML ~~LOC~~ SOLN
40.0000 mg | SUBCUTANEOUS | Status: DC
  Administered 2018-10-14 – 2018-10-15 (×2): 40 mg via SUBCUTANEOUS
  Filled 2018-10-13 (×2): qty 0.4

## 2018-10-13 NOTE — H&P (Signed)
History and Physical    Erin Zamora QBH:[id number] DOB: 25-Oct-1970 DOA: 10/13/2018  PCP: System, Pcp Not In Consultants:  None Patient coming from:  Home; NOK:  Mother, [phone]  Chief Complaint: SOB  HPI: Erin Zamora is a 48 y.o. female with no significant medical history presenting with SOB.  Patient reports several days of SOB, cough, epigastric pain.  She has not had a fever.  She denies exposure to anyone with COVID.  She did have a dental abscess on the right about 2 weeks ago and felt like the infection moved down and settled in her chest.  She was given amoxicillin and completed the antibiotic.   ED Course:  Cough, SOB worsening x 3-4 days.  CXR negative, but hypoxia to upper 80s on 2L.  CT with RLL infiltrate.  COVID negative.  Review of Systems: As per HPI; otherwise review of systems reviewed and negative.   Ambulatory Status:  Ambulates without assistance  Past Medical History:  Diagnosis Date   Allergy    seasonal   Anemia    Anxiety     Past Surgical History:  Procedure Laterality Date   APPENDECTOMY  1991   CHOLECYSTECTOMY  2003   TUBAL LIGATION  2003    Social History   Socioeconomic History   Marital status: Single    Spouse name: Not on file   Number of children: Not on file   Years of education: Not on file   Highest education level: Not on file  Occupational History   Not on file  Social Needs   Financial resource strain: Not on file   Food insecurity    Worry: Not on file    Inability: Not on file   Transportation needs    Medical: Not on file    Non-medical: Not on file  Tobacco Use   Smoking status: Former Smoker    Quit date: 02/05/2012    Years since quitting: 6.6   Smokeless tobacco: Never Used  Substance and Sexual Activity   Alcohol use: Yes    Alcohol/week: 0.0 standard drinks   Drug use: No   Sexual activity: Yes    Partners: Male    Birth control/protection: None  Lifestyle    Physical activity    Days per week: Not on file    Minutes per session: Not on file   Stress: Not on file  Relationships   Social connections    Talks on phone: Not on file    Gets together: Not on file    Attends religious service: Not on file    Active member of club or organization: Not on file    Attends meetings of clubs or organizations: Not on file    Relationship status: Not on file   Intimate partner violence    Fear of current or ex partner: Not on file    Emotionally abused: Not on file    Physically abused: Not on file    Forced sexual activity: Not on file  Other Topics Concern   Not on file  Social History Narrative   47 year old and 61 y/o daughters   63 year old granddaughter - takes care of her in the afternoon after school   Significant other - 15 years   Work: Web designer    Allergies  Allergen Reactions   Codeine Itching and Nausea And Vomiting    Family History  Problem Relation Age of Onset   Alcohol abuse Mother  COPD Mother    Hypertension Mother    Alcohol abuse Father    Diabetes Father    Drug abuse Father    Heart disease Father    Hypertension Father    Kidney disease Father    Arthritis Maternal Grandmother    Arthritis Maternal Grandfather    Cancer Paternal Grandmother    Diabetes Paternal Grandmother    Heart disease Paternal Grandmother    Diabetes Paternal Grandfather    Heart disease Paternal Grandfather    Kidney disease Paternal Grandfather    Stroke Paternal Grandfather     Prior to Admission medications   Medication Sig Start Date End Date Taking? Authorizing Provider  acetaminophen (TYLENOL) 325 MG tablet Take 650 mg by mouth every 6 (six) hours as needed for mild pain or headache.    Yes [provider]  Phenyleph-Doxylamine-DM-APAP (NYQUIL SEVERE COLD/FLU) 5-6.25-10-325 MG CAPS Take 2 capsules by mouth every 6 (six) hours as needed (for cold & flu).   Yes [provider]  amoxicillin (AMOXIL) 500 MG capsule Take 1,000 mg by mouth every 12 (twelve) hours. 09/20/18   [provider]  traMADol (ULTRAM) 50 MG tablet Take 1 tablet (50 mg total) by mouth every 8 (eight) hours as needed. Patient not taking: Reported on 10/13/2018 12/22/15   Mancel Bale, PA-C    Physical Exam: Vitals:   10/13/18 1403 10/13/18 1440 10/13/18 1445 10/13/18 1654  BP:    (!) 167/89  Pulse:    88  Resp:    18  Temp:      TempSrc:      SpO2:  (!) 88% 94% 94%  Weight: 95.3 kg     Height: 5\' 5"  (1.651 m)         General:  Appears calm and comfortable and is NAD  Eyes:  PERRL, EOMI, normal lids, iris  ENT:  grossly normal hearing, lips & tongue, mmm; broken teeth - both upper and lower right-sided molars without obvious infection  Neck:  no LAD, masses or thyromegaly  Cardiovascular:  RRR, no m/r/g. No LE edema.   Respiratory:   RLL rhonchi.  Normal respiratory effort.  Abdomen:  soft, NT, ND, NABS  Back:   normal alignment, no CVAT  Skin:  no rash or induration seen on limited exam  Musculoskeletal:  grossly normal tone BUE/BLE, good ROM, no bony abnormality  Psychiatric:  grossly normal mood and affect, speech fluent and appropriate, AOx3  Neurologic:  CN 2-12 grossly intact, moves all extremities in coordinated fashion, sensation intact    Radiological Exams on Admission: Ct Angio Chest Pe W And/or Wo Contrast  Result Date: 10/13/2018 CLINICAL DATA:  Worsening dyspnea over 3-4 days, shortness of breath which is constant and worse with exertion, orthopnea, associated cough productive of yellow to clear sputum, intermittent RIGHT mid back pain with coughing and deep breathing, former smoker, question pulmonary embolism EXAM: CT ANGIOGRAPHY CHEST WITH CONTRAST TECHNIQUE: Multidetector CT imaging of the chest was performed using the standard protocol during bolus administration of intravenous contrast. Multiplanar CT image reconstructions and  MIPs were obtained to evaluate the vascular anatomy. CONTRAST:  48mL OMNIPAQUE IOHEXOL 350 MG/ML SOLN IV COMPARISON:  None FINDINGS: Cardiovascular: Minimal atherosclerotic calcification of thoracic aorta and coronary arteries. Aorta upper normal caliber 3.8 cm diameter. No aortic aneurysm or dissection. No pericardial effusion. Pulmonary arteries adequately opacified and patent. No evidence of pulmonary embolism. Mediastinum/Nodes: Esophagus normal appearance. Base of cervical region unremarkable. No thoracic adenopathy. Lungs/Pleura:  Minimal central infiltrate LEFT lower lobe. Mild peribronchial thickening. Lungs otherwise clear. No additional infiltrate, pleural effusion or pneumothorax. Upper Abdomen: Unremarkable Musculoskeletal: No acute osseous findings. Review of the MIP images confirms the above findings. IMPRESSION: No evidence of pulmonary embolism. Bronchitic changes with minimal central RIGHT lower lobe infiltrate. Aortic Atherosclerosis (ICD10-I70.0). Electronically Signed   By: Lavonia Dana M.D.   On: 10/13/2018 16:34   Dg Chest Port 1 View  Result Date: 10/13/2018 CLINICAL DATA:  Coughing with low-grade fever. EXAM: PORTABLE CHEST 1 VIEW COMPARISON:  01/29/2018 FINDINGS: Cardiac silhouette is normal in size and configuration. No mediastinal or hilar masses. There is no evidence of adenopathy. Clear lungs.  No pleural effusion or pneumothorax. Skeletal structures are grossly intact. IMPRESSION: No active disease. Electronically Signed   By: Lajean Manes M.D.   On: 10/13/2018 15:24    EKG: Independently reviewed.  NSR with rate 85; no evidence of acute ischemia   Labs on Admission: I have personally reviewed the available labs and imaging studies at the time of the admission.  Pertinent labs:   Normal CMP Troponin <0.03 WBC 9.4 Hgb 10.0 Platelets 680 HCG negative UA WNL COVID NEGATIVE LDH 167 CRP <0.8 D-dimer 0.33 Fibrinogen 312 Ferritin 3 Procalcitonin  pending  Assessment/Plan Principal Problem:   RLL pneumonia (HCC) Active Problems:   Elevated blood pressure reading   Obesity (BMI 30.0-34.9)   RLL PNA -Given cough, mildly decreased oxygen saturation, and infiltrate in right lower lobe on chest x-ray, most likely community-acquired pneumonia.  - Other etiologies include aspiration (given recent dental work, needs coverage for anaerobic organisms) versus URI (unlikely COVID given labs and negative test). -CURB-65 score is 0 - it would be reasonable to discharge the patient to home but she is currently requiring [place] O2 and so will observe overnight on telemetry. -Pneumonia Severity Index (PSI) is Class 3, 1% mortality. -Will start Azithromycin 500 mg daily AND Unasyn due to no risk factors for MDR cause but need for coverage of anaerobic organisms (discussed with pharmacy). -Fever control -Repeat CBC in am -Sputum cultures -Blood cultures -Strep pneumo testing -Will order lower respiratory tract procalcitonin level.  Antibiotics would not be indicated for PCT <0.1 and probably should not be used for < 0.25.  >0.5 indicates infection and >>0.5 indicates more serious disease.  As the procalcitonin level normalizes, it will be reasonable to consider de-escalation of antibiotic coverage. -Due to low likelihood for COVID and will alternative explanation for her symptoms at this time, will d/c COVID precautions and will not recheck COVID testing in AM.  Elevated BP -Patient reports prior elevation in BP and probable need for medications -This appears likely -Will monitor for now but either needs meds started prior to d/c or at close outpatient f/u  Obesity -Weight loss should be encouraged   Note: This patient has been tested and is negative for the novel coronavirus COVID-19.  DVT prophylaxis:  Lovenox  Code Status:  Full - confirmed with patient Family Communication: None present; the patient declined having my contact her mother   Disposition Plan:  Home once clinically improved Consults called: None  Admission status: It is my clinical opinion that referral for OBSERVATION is reasonable and necessary in this patient based on the above information provided. The aforementioned taken together are felt to place the patient at high risk for further clinical deterioration. However it is anticipated that the patient may be medically stable for discharge from the hospital within 24 to 48 hours.  Karmen Bongo MD [facility]   How to contact the [facility] Attending or Consulting provider [place] or covering provider during after hours [place], for this patient?  1. Check the care team in [facility] and look for a) attending/consulting [facility] provider listed and b) the [facility] team listed 2. Log into [email or web address] and use [place] universal password to access. If you do not have the password, please contact the hospital operator. 3. Locate the [facility] provider you are looking for under [facility] and page to a number that you can be directly reached. 4. If you still have difficulty reaching the provider, please page the [facility] (Director on Call) for the Hospitalists listed on amion for assistance.   10/13/2018, 6:48 PM

## 2018-10-13 NOTE — ED Notes (Signed)
Attempted to call report to 2W ,  Rn not available at this time and will call me back

## 2018-10-13 NOTE — ED Provider Notes (Signed)
Care assumed from [facility], please see her note for full details, but in brief Erin Zamora is a 48 y.o. female who presents with 3 to 4 days of worsening shortness of breath and cough.  Noted to be hypoxic to the mid 80s, now on 2 L nasal cannula, low-grade fever but vitals otherwise normal.  Has had productive cough denies associated chest pain, no abdominal pain, nausea, vomiting or diarrhea.  No lower extremity swelling, no prior history of DVT or PE.  Concern for pneumonia versus COVID infection versus PE.  Short work-up so far reassuring, negative troponin, EKG unremarkable, no leukocytosis, no electrolyte derangements, normal liver function lipase.  Chest x-ray does not show evidence of pneumonia.  Plan to proceed with CTA.  Plan: f/u COVID test and CTA, will require admission due to hypoxia, currently requiring 2L  Physical Exam  BP (!) 160/92   Pulse 86   Temp 99.3 F (37.4 C) (Oral)   Resp 20   Ht 5\' 5"  (1.651 m)   Wt 95.3 kg   SpO2 94%   BMI 34.95 kg/m   Physical Exam Vitals signs and nursing note reviewed.  Constitutional:      General: She is not in acute distress.    Appearance: She is well-developed. She is not diaphoretic.  HENT:     Head: Normocephalic and atraumatic.  Eyes:     General:        Right eye: No discharge.        Left eye: No discharge.  Pulmonary:     Effort: Pulmonary effort is normal. No respiratory distress.     Comments: Satting 94% on 2 L nasal cannula, able to speak in short sentences, breathing comfortably while resting. Neurological:     Mental Status: She is alert.     Coordination: Coordination normal.  Psychiatric:        Behavior: Behavior normal.     ED Course/Procedures   Labs Reviewed  URINALYSIS, ROUTINE W REFLEX MICROSCOPIC - Abnormal; Notable for the following components:      Result Value   APPearance HAZY (*)    All other components within normal limits  CBC WITH DIFFERENTIAL/PLATELET - Abnormal;  Notable for the following components:   Hemoglobin 10.0 (*)    HCT 30.8 (*)    MCV 78.6 (*)    MCH 25.5 (*)    RDW 22.0 (*)    Platelets 680 (*)    All other components within normal limits  SARS CORONAVIRUS 2  COMPREHENSIVE METABOLIC PANEL  LIPASE, BLOOD  TROPONIN I  I-STAT BETA HCG BLOOD, ED ([facility], [facility], [facility] ONLY)   EKG Interpretation  Date/Time:  Saturday October 13 2018 14:03:27 EDT Ventricular Rate:  85 PR Interval:    QRS Duration: 86 QT Interval:  352 QTC Calculation: 419 R Axis:   -4 Text Interpretation:  Sinus rhythm normal, no change from previous Confirmed by Erin Zamora [phone]) on 10/13/2018 3:51:37 PM  Ct Angio Chest Pe W And/or Wo Contrast  Result Date: 10/13/2018 CLINICAL DATA:  Worsening dyspnea over 3-4 days, shortness of breath which is constant and worse with exertion, orthopnea, associated cough productive of yellow to clear sputum, intermittent RIGHT mid back pain with coughing and deep breathing, former smoker, question pulmonary embolism EXAM: CT ANGIOGRAPHY CHEST WITH CONTRAST TECHNIQUE: Multidetector CT imaging of the chest was performed using the standard protocol during bolus administration of intravenous contrast. Multiplanar CT image reconstructions and MIPs were obtained to evaluate the  vascular anatomy. CONTRAST:  3mL OMNIPAQUE IOHEXOL 350 MG/ML SOLN IV COMPARISON:  None FINDINGS: Cardiovascular: Minimal atherosclerotic calcification of thoracic aorta and coronary arteries. Aorta upper normal caliber 3.8 cm diameter. No aortic aneurysm or dissection. No pericardial effusion. Pulmonary arteries adequately opacified and patent. No evidence of pulmonary embolism. Mediastinum/Nodes: Esophagus normal appearance. Base of cervical region unremarkable. No thoracic adenopathy. Lungs/Pleura: Minimal central infiltrate LEFT lower lobe. Mild peribronchial thickening. Lungs otherwise clear. No additional infiltrate, pleural effusion or pneumothorax. Upper Abdomen:  Unremarkable Musculoskeletal: No acute osseous findings. Review of the MIP images confirms the above findings. IMPRESSION: No evidence of pulmonary embolism. Bronchitic changes with minimal central RIGHT lower lobe infiltrate. Aortic Atherosclerosis (ICD10-I70.0). Electronically Signed   By: Lavonia Zamora M.D.   On: 10/13/2018 16:34   Dg Chest Port 1 View  Result Date: 10/13/2018 CLINICAL DATA:  Coughing with low-grade fever. EXAM: PORTABLE CHEST 1 VIEW COMPARISON:  01/29/2018 FINDINGS: Cardiac silhouette is normal in size and configuration. No mediastinal or hilar masses. There is no evidence of adenopathy. Clear lungs.  No pleural effusion or pneumothorax. Skeletal structures are grossly intact. IMPRESSION: No active disease. Electronically Signed   By: Erin Zamora M.D.   On: 10/13/2018 15:24   Medications  cefTRIAXone (ROCEPHIN) 1 g in sodium chloride 0.9 % 100 mL IVPB (has no administration in time range)  azithromycin (ZITHROMAX) 500 mg in sodium chloride 0.9 % 250 mL IVPB (has no administration in time range)  acetaminophen (TYLENOL) tablet 650 mg (650 mg Oral Given 10/13/18 1444)  iohexol (OMNIPAQUE) 350 MG/ML injection 75 mL (75 mLs Intravenous Contrast Given 10/13/18 1622)     Procedures  MDM   Patient presenting with 3 to 4 days of shortness of breath and productive cough, low-grade fever on arrival hypoxic initially on room air placed on 2 L nasal cannula with improvement.  Chest x-ray initially negative but CTA shows no evidence of pulmonary embolus but does show bronchitic changes with a right lower lobe infiltrate, patient's labs are reassuring and COVID-19 test is negative.  Will start patient on Rocephin and azithromycin to treat community-acquired pneumonia given patient's oxygen requirement she will require admission.  Case discussed with Dr. Lorin Mercy with [facility] who will see and admit the patient.       Erin Larsen, PA-C 10/13/18 1659    Erin Shanks,  MD 10/13/18 1807

## 2018-10-13 NOTE — ED Triage Notes (Signed)
Pt states she has been having sob along with a productive cough in the last couple of days ; pt also states that she has been having to " pee every 15 min" denies any burning with urination ; pt denies any chest pain or fevers

## 2018-10-13 NOTE — ED Provider Notes (Signed)
[facility]PART[facility] Provider Note   CSN: [id number] Arrival date & time: 10/13/18  1348     History   Chief Complaint Chief Complaint  Patient presents with  . Shortness of Breath  . Cough    HPI Erin Zamora is a 48 y.o. female w/ a hx of anxiety, anemia, & prior tubal ligation who presents to the ED with complaints of progressively worsening dyspnea over the past 3 to 4 days.  Patient states her shortness of breath is constant, worse with exertion and with the supine position, alleviated with cool air/temperatures.  She reports associated cough that is productive of yellow to clear sputum in the morning and becomes dry throughout the day.  She notes that she has also had some intermittent pain to the right mid back with coughing and with deep breaths, this seemed to start before cough/dyspnea.  Has felt overheated but denies fever specifically.  Also mentions some epigastric abdominal pressure/discomfort as well as increased urinary frequency.  Denies chest pain, nausea, vomiting, diarrhea, dysuria, diaphoresis, dizziness, or syncope.Denies leg pain/swelling, hemoptysis, recent surgery/trauma, hormone use, personal hx of cancer, or hx of DVT/PE. Notes 10 hour car ride to [place] 2 months prior.  No known exposures to an individual with COVID-19.       HPI  Past Medical History:  Diagnosis Date  . Allergy    seasonal  . Anemia   . Anxiety     There are no active problems to display for this patient.   Past Surgical History:  Procedure Laterality Date  . APPENDECTOMY  1991  . CHOLECYSTECTOMY  2003  . TUBAL LIGATION  2003     OB History   No obstetric history on file.      Home Medications    Prior to Admission medications   Medication Sig Start Date End Date Taking? Authorizing Provider  acetaminophen (TYLENOL) 325 MG tablet Take 650 mg by mouth every 6 (six) hours as needed.    [provider]   amoxicillin-clavulanate (AUGMENTIN) 875-125 MG tablet Take 1 tablet by mouth every 12 (twelve) hours. 02/20/18   Orma Flaming, MD  predniSONE (DELTASONE) 5 MG tablet Take 6 pills for first day, 5 pills second day, 4 pills third day, 3 pills fourth day, 2 pills the fifth day, and 1 pill sixth day. 02/02/18   Rosemarie Ax, MD  traMADol (ULTRAM) 50 MG tablet Take 1 tablet (50 mg total) by mouth every 8 (eight) hours as needed. 12/22/15   Gale Journey, Damaris Hippo, PA-C    Family History Family History  Problem Relation Age of Onset  . Alcohol abuse Mother   . COPD Mother   . Hypertension Mother   . Alcohol abuse Father   . Diabetes Father   . Drug abuse Father   . Heart disease Father   . Hypertension Father   . Kidney disease Father   . Arthritis Maternal Grandmother   . Arthritis Maternal Grandfather   . Cancer Paternal Grandmother   . Diabetes Paternal Grandmother   . Heart disease Paternal Grandmother   . Diabetes Paternal Grandfather   . Heart disease Paternal Grandfather   . Kidney disease Paternal Grandfather   . Stroke Paternal Grandfather     Social History Social History   Tobacco Use  . Smoking status: Former Smoker    Quit date: 02/05/2012    Years since quitting: 6.6  . Smokeless tobacco: Never Used  Substance Use Topics  .  Alcohol use: Yes    Alcohol/week: 0.0 standard drinks  . Drug use: No     Allergies   Codeine   Review of Systems Review of Systems  Constitutional: Negative for chills, diaphoresis and fever.  HENT: Negative for congestion, ear pain and sore throat.   Respiratory: Positive for cough and shortness of breath.   Cardiovascular: Negative for chest pain, palpitations and leg swelling.  Gastrointestinal: Positive for abdominal pain. Negative for blood in stool, diarrhea, nausea and vomiting.  Genitourinary: Positive for frequency. Negative for dysuria.  Musculoskeletal: Positive for back pain. Negative for myalgias.  Neurological: Negative  for dizziness and syncope.  All other systems reviewed and are negative.  Physical Exam Updated Vital Signs BP (!) 160/92   Pulse 86   Temp 99.3 F (37.4 C) (Oral)   Resp 20   Ht 5\' 5"  (1.651 m)   Wt 95.3 kg   SpO2 94%   BMI 34.95 kg/m   Physical Exam Vitals signs and nursing note reviewed.  Constitutional:      General: She is not in acute distress.    Appearance: She is well-developed.  HENT:     Head: Normocephalic and atraumatic.     Right Ear: Ear canal normal. Tympanic membrane is not perforated, erythematous, retracted or bulging.     Left Ear: Ear canal normal. Tympanic membrane is not perforated, erythematous, retracted or bulging.     Ears:     Comments: No mastoid erythema/swelling/tenderness.     Nose:     Right Sinus: No maxillary sinus tenderness or frontal sinus tenderness.     Left Sinus: No maxillary sinus tenderness or frontal sinus tenderness.     Mouth/Throat:     Pharynx: Uvula midline. No oropharyngeal exudate or posterior oropharyngeal erythema.     Comments: Posterior oropharynx is symmetric appearing. Patient tolerating own secretions without difficulty. No trismus. No drooling. No hot potato voice. No swelling beneath the tongue, submandibular compartment is soft.  Eyes:     General:        Right eye: No discharge.        Left eye: No discharge.     Conjunctiva/sclera: Conjunctivae normal.     Pupils: Pupils are equal, round, and reactive to light.  Neck:     Musculoskeletal: Normal range of motion and neck supple. No edema or neck rigidity.  Cardiovascular:     Rate and Rhythm: Regular rhythm. Tachycardia present.     Pulses:          Dorsalis pedis pulses are 2+ on the right side and 2+ on the left side.     Heart sounds: No murmur.  Pulmonary:     Effort: Tachypnea present.     Breath sounds: Decreased breath sounds (R base) present. No wheezing.       Comments: SpO2 89-84% on RA.   Abdominal:     General: There is no distension.      Palpations: Abdomen is soft.     Tenderness: There is abdominal tenderness (mild) in the epigastric area and left upper quadrant. There is no guarding or rebound. Negative signs include Murphy's sign.  Musculoskeletal:     Right lower leg: She exhibits no tenderness. No edema.     Left lower leg: She exhibits no tenderness. No edema.  Lymphadenopathy:     Cervical: No cervical adenopathy.  Skin:    General: Skin is warm and dry.     Findings: No rash.  Neurological:  Mental Status: She is alert.  Psychiatric:        Behavior: Behavior normal.    ED Treatments / Results  Labs (all labs ordered are listed, but only abnormal results are displayed) Labs Reviewed  URINALYSIS, ROUTINE W REFLEX MICROSCOPIC - Abnormal; Notable for the following components:      Result Value   APPearance HAZY (*)    All other components within normal limits  CBC WITH DIFFERENTIAL/PLATELET - Abnormal; Notable for the following components:   Hemoglobin 10.0 (*)    HCT 30.8 (*)    MCV 78.6 (*)    MCH 25.5 (*)    RDW 22.0 (*)    Platelets 680 (*)    All other components within normal limits  SARS CORONAVIRUS 2  COMPREHENSIVE METABOLIC PANEL  LIPASE, BLOOD  TROPONIN I  I-STAT BETA HCG BLOOD, ED ([facility], [facility], [facility] ONLY)    EKG   EKG Interpretation  Date/Time:  Saturday October 13 2018 14:03:27 EDT Ventricular Rate:  85 PR Interval:    QRS Duration: 86 QT Interval:  352 QTC Calculation: 419 R Axis:   -4 Text Interpretation:  Sinus rhythm normal, no change from previous Confirmed by [name] [phone]) on 10/13/2018 3:51:37 PM      Radiology Dg Chest Port 1 View  Result Date: 10/13/2018 CLINICAL DATA:  Coughing with low-grade fever. EXAM: PORTABLE CHEST 1 VIEW COMPARISON:  01/29/2018 FINDINGS: Cardiac silhouette is normal in size and configuration. No mediastinal or hilar masses. There is no evidence of adenopathy. Clear lungs.  No pleural effusion or pneumothorax. Skeletal structures are grossly  intact. IMPRESSION: No active disease. Electronically Signed   By: Lajean Manes M.D.   On: 10/13/2018 15:24    Procedures Procedures (including critical care time)  Medications Ordered in ED Medications  acetaminophen (TYLENOL) tablet 650 mg (650 mg Oral Given 10/13/18 1444)     Initial Impression / Assessment and Plan / ED Course  I have reviewed the triage vital signs and the nursing notes.  Pertinent labs & imaging results that were available during my care of the patient were reviewed by me and considered in my medical decision making (see chart for details).   Patient presents to the ED w/ complaints of progressively worsening dyspnea associated w/ cough & R mid back pain. She has also had some epigastric discomfort that feels like pressure as well as dysuria. She is noted to be somewhat hypoxic on room air w/ tachypnea & SpO2 ranging 88-94% improved on 2L via [place], BP elevated, mildly tachycardic, low grade temp @ 100.0 orally upon my checking patient's temperature. She has some decreased breath sounds @ the R base. R back pain is reproducible w/ palpation, no overlying rashes. Mild epigastric abdominal tenderness w/o peritoneal signs. DDX: pneumonia, COVID, PE, ACS, pneumothorax, CHF, dissection, GERD, pancreatitis. Plan for CXR, EKG, labs to include cardiac work up and abdominal labs, & covid swab.   CBC: No leukocytosis. Anemia & thrombocytosis similar to prior.  CMP: WNL. No significant electrolyte derangement. LFTs & renal function preserved.  Lipase: WNL UA: No UTI Troponin: WNL EKG: No STEMI, no significant change from prior  CXR: No active disease- no pneumonia, fluid over load, or pneumothorax.   Given CXR w/o pneumonia w/ her new oxygen requirement high suspicion for COVID vs. PE. Will proceed w/ CTA for further assessment.   ARYAHI DENZLER was evaluated in Emergency Department on 10/13/2018 for the symptoms described in the history of present illness. He/she was  evaluated in the context of the global COVID-19 pandemic, which necessitated consideration that the patient might be at risk for infection with the SARS-CoV-2 virus that causes COVID-19. Institutional protocols and algorithms that pertain to the evaluation of patients at risk for COVID-19 are in a state of rapid change based on information released by regulatory bodies including the CDC and federal and state organizations. These policies and algorithms were followed during the patient's care in the ED.  Findings and plan of care discussed with supervising physician Dr. Lita Mains who is in agreement.   16:00: Patient care transitioned to [facility] PA-C at change of shift pending COVID test, CTA, & admission consultation given patient's new oxygen requirement.   Final Clinical Impressions(s) / ED Diagnoses   Final diagnoses:  None    ED Discharge Orders    None       Amaryllis Dyke, PA-C 10/13/18 1553    [name], MD 10/13/18 1807

## 2018-10-14 DIAGNOSIS — E669 Obesity, unspecified: Secondary | ICD-10-CM | POA: Diagnosis present

## 2018-10-14 DIAGNOSIS — R030 Elevated blood-pressure reading, without diagnosis of hypertension: Secondary | ICD-10-CM | POA: Diagnosis present

## 2018-10-14 DIAGNOSIS — R0602 Shortness of breath: Secondary | ICD-10-CM | POA: Diagnosis not present

## 2018-10-14 DIAGNOSIS — J181 Lobar pneumonia, unspecified organism: Secondary | ICD-10-CM | POA: Diagnosis present

## 2018-10-14 DIAGNOSIS — Z833 Family history of diabetes mellitus: Secondary | ICD-10-CM | POA: Diagnosis not present

## 2018-10-14 DIAGNOSIS — Z20828 Contact with and (suspected) exposure to other viral communicable diseases: Secondary | ICD-10-CM | POA: Diagnosis present

## 2018-10-14 DIAGNOSIS — D509 Iron deficiency anemia, unspecified: Secondary | ICD-10-CM | POA: Diagnosis present

## 2018-10-14 DIAGNOSIS — E876 Hypokalemia: Secondary | ICD-10-CM | POA: Diagnosis not present

## 2018-10-14 DIAGNOSIS — J440 Chronic obstructive pulmonary disease with acute lower respiratory infection: Secondary | ICD-10-CM | POA: Diagnosis present

## 2018-10-14 DIAGNOSIS — J9601 Acute respiratory failure with hypoxia: Secondary | ICD-10-CM | POA: Diagnosis present

## 2018-10-14 DIAGNOSIS — Z6837 Body mass index (BMI) 37.0-37.9, adult: Secondary | ICD-10-CM | POA: Diagnosis not present

## 2018-10-14 DIAGNOSIS — R05 Cough: Secondary | ICD-10-CM | POA: Diagnosis not present

## 2018-10-14 DIAGNOSIS — J189 Pneumonia, unspecified organism: Secondary | ICD-10-CM | POA: Diagnosis present

## 2018-10-14 DIAGNOSIS — J441 Chronic obstructive pulmonary disease with (acute) exacerbation: Secondary | ICD-10-CM | POA: Diagnosis present

## 2018-10-14 DIAGNOSIS — Z836 Family history of other diseases of the respiratory system: Secondary | ICD-10-CM | POA: Diagnosis not present

## 2018-10-14 DIAGNOSIS — Z8249 Family history of ischemic heart disease and other diseases of the circulatory system: Secondary | ICD-10-CM | POA: Diagnosis not present

## 2018-10-14 DIAGNOSIS — Z87891 Personal history of nicotine dependence: Secondary | ICD-10-CM | POA: Diagnosis not present

## 2018-10-14 LAB — CBC WITH DIFFERENTIAL/PLATELET
Abs Immature Granulocytes: 0.06 10*3/uL (ref 0.00–0.07)
Basophils Absolute: 0.1 10*3/uL (ref 0.0–0.1)
Basophils Relative: 1 %
Eosinophils Absolute: 0.2 10*3/uL (ref 0.0–0.5)
Eosinophils Relative: 3 %
HCT: 26.6 % — ABNORMAL LOW (ref 36.0–46.0)
Hemoglobin: 9.2 g/dL — ABNORMAL LOW (ref 12.0–15.0)
Immature Granulocytes: 1 %
Lymphocytes Relative: 25 %
Lymphs Abs: 2.1 10*3/uL (ref 0.7–4.0)
MCH: 27.8 pg (ref 26.0–34.0)
MCHC: 34.6 g/dL (ref 30.0–36.0)
MCV: 80.4 fL (ref 80.0–100.0)
Monocytes Absolute: 0.7 10*3/uL (ref 0.1–1.0)
Monocytes Relative: 8 %
Neutro Abs: 5.4 10*3/uL (ref 1.7–7.7)
Neutrophils Relative %: 62 %
Platelets: 636 10*3/uL — ABNORMAL HIGH (ref 150–400)
RBC: 3.31 MIL/uL — ABNORMAL LOW (ref 3.87–5.11)
RDW: 22.6 % — ABNORMAL HIGH (ref 11.5–15.5)
WBC: 8.6 10*3/uL (ref 4.0–10.5)
nRBC: 0 % (ref 0.0–0.2)

## 2018-10-14 LAB — BASIC METABOLIC PANEL
Anion gap: 13 (ref 5–15)
BUN: 6 mg/dL (ref 6–20)
CO2: 24 mmol/L (ref 22–32)
Calcium: 8.8 mg/dL — ABNORMAL LOW (ref 8.9–10.3)
Chloride: 99 mmol/L (ref 98–111)
Creatinine, Ser: 0.76 mg/dL (ref 0.44–1.00)
GFR calc Af Amer: >60 mL/min (ref >60)
GFR calc non Af Amer: >60 mL/min (ref >60)
Glucose, Bld: 150 mg/dL — ABNORMAL HIGH (ref 70–99)
Potassium: 3.1 mmol/L — ABNORMAL LOW (ref 3.5–5.1)
Sodium: 136 mmol/L (ref 135–145)

## 2018-10-14 LAB — HIV ANTIBODY (ROUTINE TESTING W REFLEX): HIV Screen 4th Generation wRfx: NONREACTIVE

## 2018-10-14 LAB — STREP PNEUMONIAE URINARY ANTIGEN: Strep Pneumo Urinary Antigen: NEGATIVE

## 2018-10-14 LAB — BRAIN NATRIURETIC PEPTIDE: B Natriuretic Peptide: 9.3 pg/mL (ref 0.0–100.0)

## 2018-10-14 LAB — TSH: TSH: 0.961 u[IU]/mL (ref 0.350–4.500)

## 2018-10-14 MED ORDER — FERROUS SULFATE 325 (65 FE) MG PO TABS
325.0000 mg | ORAL_TABLET | Freq: Every day | ORAL | Status: DC
  Administered 2018-10-14 – 2018-10-15 (×2): 325 mg via ORAL
  Filled 2018-10-14 (×2): qty 1

## 2018-10-14 MED ORDER — ALBUTEROL SULFATE (2.5 MG/3ML) 0.083% IN NEBU
2.5000 mg | INHALATION_SOLUTION | Freq: Once | RESPIRATORY_TRACT | Status: AC
  Administered 2018-10-14: 2.5 mg via RESPIRATORY_TRACT
  Filled 2018-10-14: qty 3

## 2018-10-14 MED ORDER — MAGNESIUM SULFATE 2 GM/50ML IV SOLN
2.0000 g | Freq: Once | INTRAVENOUS | Status: AC
  Administered 2018-10-14: 2 g via INTRAVENOUS
  Filled 2018-10-14: qty 50

## 2018-10-14 MED ORDER — GUAIFENESIN-DM 100-10 MG/5ML PO SYRP
5.0000 mL | ORAL_SOLUTION | ORAL | Status: DC | PRN
  Administered 2018-10-14 – 2018-10-15 (×3): 5 mL via ORAL
  Filled 2018-10-14 (×3): qty 5

## 2018-10-14 MED ORDER — ALBUTEROL SULFATE HFA 108 (90 BASE) MCG/ACT IN AERS: 2.0000 | INHALATION_SPRAY | Freq: Once | RESPIRATORY_TRACT | Status: DC

## 2018-10-14 MED ORDER — HYDROCOD POLST-CPM POLST ER 10-8 MG/5ML PO SUER
5.0000 mL | Freq: Two times a day (BID) | ORAL | Status: DC | PRN
  Administered 2018-10-14 (×2): 5 mL via ORAL
  Filled 2018-10-14 (×2): qty 5

## 2018-10-14 MED ORDER — PANTOPRAZOLE SODIUM 40 MG PO TBEC
40.0000 mg | DELAYED_RELEASE_TABLET | Freq: Every day | ORAL | Status: DC
  Administered 2018-10-14 – 2018-10-15 (×2): 40 mg via ORAL
  Filled 2018-10-14 (×2): qty 1

## 2018-10-14 MED ORDER — ALBUTEROL SULFATE (2.5 MG/3ML) 0.083% IN NEBU
2.5000 mg | INHALATION_SOLUTION | Freq: Three times a day (TID) | RESPIRATORY_TRACT | Status: DC
  Administered 2018-10-14 – 2018-10-15 (×3): 2.5 mg via RESPIRATORY_TRACT
  Filled 2018-10-14 (×3): qty 3

## 2018-10-14 MED ORDER — METHYLPREDNISOLONE SODIUM SUCC 125 MG IJ SOLR
60.0000 mg | INTRAMUSCULAR | Status: DC
  Administered 2018-10-14 – 2018-10-15 (×2): 60 mg via INTRAVENOUS
  Filled 2018-10-14 (×2): qty 2

## 2018-10-14 MED ORDER — SENNOSIDES-DOCUSATE SODIUM 8.6-50 MG PO TABS: 2.0000 | ORAL_TABLET | Freq: Every evening | ORAL | Status: DC | PRN

## 2018-10-14 MED ORDER — NICOTINE 21 MG/24HR TD PT24
21.0000 mg | MEDICATED_PATCH | Freq: Every day | TRANSDERMAL | Status: DC
  Filled 2018-10-14: qty 1

## 2018-10-14 MED ORDER — POTASSIUM CHLORIDE CRYS ER 20 MEQ PO TBCR
40.0000 meq | EXTENDED_RELEASE_TABLET | Freq: Two times a day (BID) | ORAL | Status: AC
  Administered 2018-10-14 – 2018-10-15 (×2): 40 meq via ORAL
  Filled 2018-10-14 (×2): qty 2

## 2018-10-14 MED ORDER — LABETALOL HCL 5 MG/ML IV SOLN: 10.0000 mg | Freq: Four times a day (QID) | INTRAVENOUS | Status: DC | PRN

## 2018-10-14 MED ORDER — ALBUTEROL SULFATE (2.5 MG/3ML) 0.083% IN NEBU
2.5000 mg | INHALATION_SOLUTION | Freq: Three times a day (TID) | RESPIRATORY_TRACT | Status: DC
  Administered 2018-10-14 (×2): 2.5 mg via RESPIRATORY_TRACT
  Filled 2018-10-14 (×2): qty 3

## 2018-10-14 NOTE — Progress Notes (Signed)
RT instructed patient on the use of incentive spirometer. Patient able to reach 1500 mL.

## 2018-10-14 NOTE — Progress Notes (Addendum)
PROGRESS NOTE  Erin Zamora XIP:[id number] DOB: January 17, 1971 DOA: 10/13/2018 PCP: System, Pcp Not In  HPI/Recap of past 24 hours: Erin Zamora is a 48 y.o. female with no significant medical history presenting with SOB.  Patient reports several days of SOB, cough, epigastric pain.  She has not had a fever.  She denies exposure to anyone with COVID.  She did have a dental abscess on the right about 2 weeks ago and felt like the infection moved down and settled in her chest.  She was given amoxicillin and completed the antibiotic.   ED Course:  Cough, SOB worsening x 3-4 days.  CXR negative, but hypoxia to upper 80s on 2L.  CT with RLL infiltrate.  COVID negative.  10/14/18: Patient seen and examined at bedside.  Reports persistent productive cough and shortness of breath with minimal movement.  Reports ongoing tobacco use off and on for 30 years with attempting to quit multiple times unsuccessfully.  She denies chest pain but admits to epigastric pain with frequent NSAID use for sciatica pain.  Lipase normal.  Hemoglobin 9.2 close to baseline of 10.   Assessment/Plan: Principal Problem:   RLL pneumonia ([place]) Active Problems:   Elevated blood pressure reading   Obesity (BMI 30.0-34.9)  Acute hypoxic respiratory failure likely secondary to right lower lobe community-acquired pneumonia versus COPD exacerbation Suspect undiagnosed COPD We will obtain PFTs Independent review chest x-ray and CTA PE which showed mild infiltrates in the right lower lobe. Continue antibiotics azithromycin and Unasyn Maintain O2 saturation greater than 92% Home O2 evaluation prior to DC Start albuterol nebs 3 times daily Start cough medication Start Solu-Medrol 40 mg daily  Elevated BP Her blood pressure has been consistently elevated She does not have a known history of hypertension We will continue to monitor If blood pressure remains elevated will start amlodipine 5 mg daily  Suspected  undiagnosed COPD in the setting of ongoing tobacco use x30 years PFT ordered Management as stated above  Epigastric discomfort Lipase normal Avoid NSAIDs use Start protonix 40 mg daily.  Hypokalemia K+ 3.1 Repleted w kcl po 40 meq x2 doses Repeat BMP am with mag2+ level  Iron def anemia Iron studies show iron deficiency No sign of overt bleeding Start ferrous sulfate 325 mg daily Senokot prn for bowel regimen  Tobacco use disorder Reports smokes 1 pack every 3 days x 30 years Very brief times when she did not smoke Has attempted to quit in the past unsuccessfully Tobacco counseling at bedside Nicotine patch as needed  Dizziness with standing Self-reported dizziness upon standing We will obtain orthostatic vital signs Fall precautions OT to assess  Weight gain and swelling Self-reported Obtain BMP and TSH Obtain I's and O's and daily weight  Obesity BMI 37 Recommend weight loss outpatient with regular physical activity and healthy dieting  Risks: High risk for decompensation as the patient has persistent productive cough and dyspnea with minimal exertion.  Patient will require least 2 midnights for further evaluation and treatment of present condition.   DVT prophylaxis:  Lovenox subcu daily Code Status:  Full - confirmed with patient Family Communication: None present; the patient declined having my contact her mother  Disposition Plan:   Home possibly in 1 to 2 days when cough and dyspnea have improved.  Consults called: None   Objective: Vitals:   10/13/18 2300 10/14/18 0418 10/14/18 0621 10/14/18 0807  BP: (!) 152/97 (!) 148/96  137/79  Pulse: 87 82  84  Resp:  18  (!) 21 18  Temp: 98.5 F (36.9 C)   98.6 F (37 C)  TempSrc: Oral   Oral  SpO2: 97% 95% 95% 93%  Weight:      Height:        Intake/Output Summary (Last 24 hours) at 10/14/2018 0942 Last data filed at 10/14/2018 0600 Gross per 24 hour  Intake 794.53 ml  Output -  Net 794.53 ml    Filed Weights   10/13/18 1403 10/13/18 1849  Weight: 95.3 kg 102.9 kg    Exam:  . General: 48 y.o. year-old female well developed well nourished in no acute distress.  Alert and oriented x3. . Cardiovascular: Regular rate and rhythm with no rubs or gallops.  No thyromegaly or JVD noted.   Marland Kitchen Respiratory: Persistent cough on exam.  Mild rales at bases with no wheezes noted.  Poor inspiratory effort. . Abdomen: Soft nontender nondistended with normal bowel sounds x4 quadrants. . Musculoskeletal: Trace lower extremity edema. 2/4 pulses in all 4 extremities. Marland Kitchen Psychiatry: Mood is appropriate for condition and setting   Data Reviewed: CBC: Recent Labs  Lab 10/13/18 1450 10/14/18 0543  WBC 9.4 8.6  NEUTROABS 6.4 5.4  HGB 10.0* 9.2*  HCT 30.8* 26.6*  MCV 78.6* 80.4  PLT 680* 237*   Basic Metabolic Panel: Recent Labs  Lab 10/13/18 1450 10/14/18 0543  NA 137 136  K 3.5 3.1*  CL 103 99  CO2 22 24  GLUCOSE 86 150*  BUN 6 6  CREATININE 0.67 0.76  CALCIUM 9.1 8.8*   GFR: Estimated Creatinine Clearance: 104.6 mL/min (by C-G formula based on SCr of 0.76 mg/dL). Liver Function Tests: Recent Labs  Lab 10/13/18 1450  AST 22  ALT 18  ALKPHOS 63  BILITOT 0.6  PROT 6.8  ALBUMIN 3.8   Recent Labs  Lab 10/13/18 1450  LIPASE 28   No results for input(s): AMMONIA in the last 168 hours. Coagulation Profile: No results for input(s): INR, PROTIME in the last 168 hours. Cardiac Enzymes: Recent Labs  Lab 10/13/18 1450  TROPONINI <0.03   BNP (last 3 results) No results for input(s): PROBNP in the last 8760 hours. HbA1C: No results for input(s): HGBA1C in the last 72 hours. CBG: No results for input(s): GLUCAP in the last 168 hours. Lipid Profile: No results for input(s): CHOL, HDL, LDLCALC, TRIG, CHOLHDL, LDLDIRECT in the last 72 hours. Thyroid Function Tests: No results for input(s): TSH, T4TOTAL, FREET4, T3FREE, THYROIDAB in the last 72 hours. Anemia Panel: Recent  Labs    10/13/18 1740  FERRITIN 3*   Urine analysis:    Component Value Date/Time   COLORURINE YELLOW 10/13/2018 1355   APPEARANCEUR HAZY (A) 10/13/2018 1355   LABSPEC 1.020 10/13/2018 1355   PHURINE 5.0 10/13/2018 1355   GLUCOSEU NEGATIVE 10/13/2018 1355   HGBUR NEGATIVE 10/13/2018 1355   BILIRUBINUR NEGATIVE 10/13/2018 1355   KETONESUR NEGATIVE 10/13/2018 1355   PROTEINUR NEGATIVE 10/13/2018 1355   UROBILINOGEN 1.0 02/18/2012 1809   NITRITE NEGATIVE 10/13/2018 1355   LEUKOCYTESUR NEGATIVE 10/13/2018 1355   Sepsis Labs: @LABRCNTIP (procalcitonin:4,lacticidven:4)  ) Recent Results (from the past 240 hour(s))  SARS Coronavirus 2     Status: None   Collection Time: 10/13/18  2:53 PM  Result Value Ref Range Status   SARS Coronavirus 2 NOT DETECTED NOT DETECTED Final    Comment: (NOTE) SARS-CoV-2 target nucleic acids are NOT DETECTED. The SARS-CoV-2 RNA is generally detectable in upper and lower respiratory specimens during the acute  phase of infection.  Negative  results do not preclude SARS-CoV-2 infection, do not rule out co-infections with other pathogens, and should not be used as the sole basis for treatment or other patient management decisions.  Negative results must be combined with clinical observations, patient history, and epidemiological information. The expected result is Not Detected. Fact Sheet for Patients: http://www.biofiredefense.com/wp-content/uploads/2020/03/BIOFIRE-COVID -19-patients.pdf Fact Sheet for Healthcare Providers: http://www.biofiredefense.com/wp-content/uploads/2020/03/BIOFIRE-COVID -19-hcp.pdf This test is not yet approved or cleared by the [place] and  has been authorized for detection and/or diagnosis of SARS-CoV-2 by FDA under an Emergency Use Authorization (EUA).  This EUA will remain in effec t (meaning this test can be used) for the duration of  the COVID-19 declaration under Section 564(b)(1) of the Act, 21 U.S.C.  section 360bbb-3(b)(1), unless the authorization is terminated or revoked sooner. Performed at [facility], [place] [address], [place], [place] [address]       Studies: Ct Angio Chest Pe W And/or Wo Contrast  Result Date: 10/13/2018 CLINICAL DATA:  Worsening dyspnea over 3-4 days, shortness of breath which is constant and worse with exertion, orthopnea, associated cough productive of yellow to clear sputum, intermittent RIGHT mid back pain with coughing and deep breathing, former smoker, question pulmonary embolism EXAM: CT ANGIOGRAPHY CHEST WITH CONTRAST TECHNIQUE: Multidetector CT imaging of the chest was performed using the standard protocol during bolus administration of intravenous contrast. Multiplanar CT image reconstructions and MIPs were obtained to evaluate the vascular anatomy. CONTRAST:  44mL OMNIPAQUE IOHEXOL 350 MG/ML SOLN IV COMPARISON:  None FINDINGS: Cardiovascular: Minimal atherosclerotic calcification of thoracic aorta and coronary arteries. Aorta upper normal caliber 3.8 cm diameter. No aortic aneurysm or dissection. No pericardial effusion. Pulmonary arteries adequately opacified and patent. No evidence of pulmonary embolism. Mediastinum/Nodes: Esophagus normal appearance. Base of cervical region unremarkable. No thoracic adenopathy. Lungs/Pleura: Minimal central infiltrate LEFT lower lobe. Mild peribronchial thickening. Lungs otherwise clear. No additional infiltrate, pleural effusion or pneumothorax. Upper Abdomen: Unremarkable Musculoskeletal: No acute osseous findings. Review of the MIP images confirms the above findings. IMPRESSION: No evidence of pulmonary embolism. Bronchitic changes with minimal central RIGHT lower lobe infiltrate. Aortic Atherosclerosis (ICD10-I70.0). Electronically Signed   By: Lavonia Dana M.D.   On: 10/13/2018 16:34   Dg Chest Port 1 View  Result Date: 10/13/2018 CLINICAL DATA:  Coughing with low-grade fever. EXAM: PORTABLE CHEST 1 VIEW COMPARISON:   01/29/2018 FINDINGS: Cardiac silhouette is normal in size and configuration. No mediastinal or hilar masses. There is no evidence of adenopathy. Clear lungs.  No pleural effusion or pneumothorax. Skeletal structures are grossly intact. IMPRESSION: No active disease. Electronically Signed   By: Lajean Manes M.D.   On: 10/13/2018 15:24    Scheduled Meds: . albuterol  2.5 mg Nebulization TID  . enoxaparin (LOVENOX) injection  40 mg Subcutaneous Q24H  . sodium chloride flush  3 mL Intravenous Q12H    Continuous Infusions: . sodium chloride Stopped (10/14/18 0514)  . ampicillin-sulbactam (UNASYN) IV 3 g (10/14/18 0922)  . azithromycin       LOS: 0 days     Kayleen Memos, MD [facility] Pager [phone]  If 7PM-7AM, please contact night-coverage [email or web address] Password [facility] 10/14/2018, 9:42 AM

## 2018-10-15 ENCOUNTER — Other Ambulatory Visit (HOSPITAL_COMMUNITY): Payer: Self-pay

## 2018-10-15 DIAGNOSIS — R0602 Shortness of breath: Secondary | ICD-10-CM

## 2018-10-15 DIAGNOSIS — R05 Cough: Secondary | ICD-10-CM

## 2018-10-15 DIAGNOSIS — E669 Obesity, unspecified: Secondary | ICD-10-CM

## 2018-10-15 LAB — BASIC METABOLIC PANEL
Anion gap: 9 (ref 5–15)
BUN: 8 mg/dL (ref 6–20)
CO2: 24 mmol/L (ref 22–32)
Calcium: 9.2 mg/dL (ref 8.9–10.3)
Chloride: 105 mmol/L (ref 98–111)
Creatinine, Ser: 0.76 mg/dL (ref 0.44–1.00)
GFR calc Af Amer: >60 mL/min (ref >60)
GFR calc non Af Amer: >60 mL/min (ref >60)
Glucose, Bld: 128 mg/dL — ABNORMAL HIGH (ref 70–99)
Potassium: 4.1 mmol/L (ref 3.5–5.1)
Sodium: 138 mmol/L (ref 135–145)

## 2018-10-15 LAB — MAGNESIUM: Magnesium: 2.2 mg/dL (ref 1.7–2.4)

## 2018-10-15 MED ORDER — ALBUTEROL SULFATE (2.5 MG/3ML) 0.083% IN NEBU: 2.5000 mg | INHALATION_SOLUTION | Freq: Two times a day (BID) | RESPIRATORY_TRACT | Status: DC | PRN

## 2018-10-15 MED ORDER — NICOTINE 21 MG/24HR TD PT24: 21.0000 mg | MEDICATED_PATCH | Freq: Every day | TRANSDERMAL | 0 refills | Status: DC

## 2018-10-15 MED ORDER — AZITHROMYCIN 250 MG PO TABS: ORAL_TABLET | ORAL | 0 refills | Status: DC

## 2018-10-15 MED ORDER — PANTOPRAZOLE SODIUM 40 MG PO TBEC: 40.0000 mg | DELAYED_RELEASE_TABLET | Freq: Every day | ORAL | 0 refills | Status: DC

## 2018-10-15 MED ORDER — AZITHROMYCIN 250 MG PO TABS
500.0000 mg | ORAL_TABLET | Freq: Every day | ORAL | Status: DC
  Administered 2018-10-15: 500 mg via ORAL
  Filled 2018-10-15: qty 2

## 2018-10-15 MED ORDER — BENZONATATE 100 MG PO CAPS: 100.0000 mg | ORAL_CAPSULE | Freq: Two times a day (BID) | ORAL | 0 refills | Status: DC | PRN

## 2018-10-15 MED ORDER — PREDNISONE 20 MG PO TABS: 20.0000 mg | ORAL_TABLET | Freq: Every day | ORAL | 0 refills | Status: AC

## 2018-10-15 MED ORDER — FERROUS SULFATE 325 (65 FE) MG PO TABS: 325.0000 mg | ORAL_TABLET | Freq: Every day | ORAL | 0 refills | Status: DC

## 2018-10-15 NOTE — Progress Notes (Signed)
CPT not done at this time due to patient having just eaten a meal.

## 2018-10-15 NOTE — Progress Notes (Addendum)
RT instructed patient on the use of flutter valve. Patient able to demonstrate back good technique.  RT placed patient on chest vest CPT for 10 minutes. Patient tolerated well.

## 2018-10-15 NOTE — Care Plan (Signed)
[facility] Brief Plan of Care Update   - Appointment has been scheduled with [facility] as hospital follow up - Monday June 29, 9:30 AM with Dr. Vaughan Browner  - [address] [place], [place] [address] - [phone]     Eliseo Gum MSN, AGACNP-BC [place] [phone] If no answer, [phone] 10/15/2018, 11:30 AM

## 2018-10-15 NOTE — Consult Note (Addendum)
NAME:  Erin Zamora, MRN:  [id number], DOB:  1971-02-25, LOS: 1 ADMISSION DATE:  10/13/2018, CONSULTATION DATE:  6/15 REFERRING MD:  [place] Crane - THN, CHIEF COMPLAINT:  SOB  Brief History   48 yo F with SOB, intermittent cough, stable on 1LNC.   History of present illness   48 yo F with obesity and anxiety,  Seasonal allergies, history of tobacco abuse presented 6/14 with SOB. SOB began 1 month ago with associated non-productive cough. Patient has long history of smoking and quit smoking 2 weeks ago. Since that time, patient reports her anxiety has increased and she worries she cannot breathe-- she states this is her greatest fear. Patient also noticed orthopnea and DOE starting "a few days ago" as well as post-nasal drip. Per chart review, patient with dental abscess 2 weeks ago and is s/p course of amoxicillin.   Patient stable on [facility]  PCCM asked to evaluate   Past Medical History  Anxiety Tobacco abuse Obesity  Seasonal allergies   Significant Hospital Events   6/14 admitted  Consults:  PCCM   Procedures:    Significant Diagnostic Tests:    Micro Data:    Antimicrobials:  Azithrymycin  Interim history/subjective:  SpO2 80s overnight Patient with increasing anxiety   Objective   Blood pressure 135/74, pulse 98, temperature 98.2 F (36.8 C), temperature source Oral, resp. rate 16, height 5\' 5"  (1.651 m), weight 102.9 kg, SpO2 92 %.        Intake/Output Summary (Last 24 hours) at 10/15/2018 1051 Last data filed at 10/14/2018 1845 Gross per 24 hour  Intake 1140 ml  Output -  Net 1140 ml   Filed Weights   10/13/18 1403 10/13/18 1849  Weight: 95.3 kg 102.9 kg    Examination: General: Obese adult F reclined in bed NAD HENT: NCAT Pink mmm, patent nares. Post-nasal drip.  Lungs: CTA. Symmetrical chest expansion. No accessory muscle recruitment on RA Cardiovascular: RRR s1s2 no rgm. Capillary refill < 3 seconds  Abdomen: Obese, soft, round, + bowel sounds  x4 Extremities: 1+ BLE edema. Symmetrical bulk and tone. No cyanosis or clubbing Neuro: AAOx4. PERRL. No focal defecits  GU: clear yellow urine  Resolved Hospital Problem list     Assessment & Plan:   Suspected Pneumonia Rhinitis  Orthopnea Dyspnea on Exertion  Anxiety  Tobacco Use disorder P -Continue tobacco cessation  -Titrate O2 for SpO2 88-92% -PFTs -Ambulatory sat study  -Recommend outpatient pulm follow up, recommend sleep study -Continue Albuterol, azithromycin per primary -Recommend tapering steroids moving forward  -IS, flutter valve, CPT   Rest Per Primary   PCCM will sign off at this time please re-engage if needed   Best practice:  Diet: Heart healthy  Pain/Anxiety/Delirium protocol (if indicated):  VAP protocol (if indicated): n/a DVT prophylaxis: lovenox  GI prophylaxis: protonix  Glucose control: monitor  Code Status: Full  Family Communication: per primary. Patient updated at bedside Disposition: Current level of care   Labs   CBC: Recent Labs  Lab 10/13/18 1450 10/14/18 0543  WBC 9.4 8.6  NEUTROABS 6.4 5.4  HGB 10.0* 9.2*  HCT 30.8* 26.6*  MCV 78.6* 80.4  PLT 680* 636*    Basic Metabolic Panel: Recent Labs  Lab 10/13/18 1450 10/14/18 0543 10/15/18 0615  NA 137 136 138  K 3.5 3.1* 4.1  CL 103 99 105  CO2 22 24 24   GLUCOSE 86 150* 128*  BUN 6 6 8   CREATININE 0.67 0.76 0.76  CALCIUM 9.1 8.8*  9.2  MG  --   --  2.2   GFR: Estimated Creatinine Clearance: 104.6 mL/min (by C-G formula based on SCr of 0.76 mg/dL). Recent Labs  Lab 10/13/18 1450 10/13/18 1740 10/14/18 0543  PROCALCITON  --  <0.10  --   WBC 9.4  --  8.6    Liver Function Tests: Recent Labs  Lab 10/13/18 1450  AST 22  ALT 18  ALKPHOS 63  BILITOT 0.6  PROT 6.8  ALBUMIN 3.8   Recent Labs  Lab 10/13/18 1450  LIPASE 28   No results for input(s): AMMONIA in the last 168 hours.  ABG    Component Value Date/Time   HCO3 25.7 (H) 07/18/2007 2128    TCO2 27 07/18/2007 2128     Coagulation Profile: No results for input(s): INR, PROTIME in the last 168 hours.  Cardiac Enzymes: Recent Labs  Lab 10/13/18 1450  TROPONINI <0.03    HbA1C: No results found for: HGBA1C  CBG: No results for input(s): GLUCAP in the last 168 hours.  Review of Systems:   Endorses cough x 1 month Endorses DOE x 2 weeks Endorses orthopnea x 2 days and post nasal drip x 4 days Denies chest pain, palpitations Endorses BLE edema, truncal edema Denies fever, chills, sick exposures  Denies excessive thirst, urinary frequency, urinary urgency   Past Medical History  She,  has a past medical history of Allergy, Anemia, and Anxiety.   Surgical History    Past Surgical History:  Procedure Laterality Date  . APPENDECTOMY  1991  . CHOLECYSTECTOMY  2003  . TUBAL LIGATION  2003     Social History   reports that she quit smoking about 6 years ago. She has never used smokeless tobacco. She reports current alcohol use. She reports that she does not use drugs.   Family History   Her family history includes Alcohol abuse in her father and mother; Arthritis in her maternal grandfather and maternal grandmother; COPD in her mother; Cancer in her paternal grandmother; Diabetes in her father, paternal grandfather, and paternal grandmother; Drug abuse in her father; Heart disease in her father, paternal grandfather, and paternal grandmother; Hypertension in her father and mother; Kidney disease in her father and paternal grandfather; Stroke in her paternal grandfather.   Allergies Allergies  Allergen Reactions  . Codeine Itching and Nausea And Vomiting    Home Medications  Prior to Admission medications   Medication Sig Start Date End Date Taking? Authorizing Provider  acetaminophen (TYLENOL) 325 MG tablet Take 650 mg by mouth every 6 (six) hours as needed for mild pain or headache.    Yes [provider]  Phenyleph-Doxylamine-DM-APAP (NYQUIL SEVERE  COLD/FLU) 5-6.25-10-325 MG CAPS Take 2 capsules by mouth every 6 (six) hours as needed (for cold & flu).   Yes [provider]      Eliseo Gum MSN, AGACNP-BC [place] [phone] If no answer, [phone] 10/15/2018, 10:51 AM  Attending Note:  48 year old obese smoker female with history of seasonal allergies presenting with SOB.  Patient is marginally hypoxemic with clear lungs on exam.  Reports seasonal allergies and cough.  I reviewed CTA myself, no PE noted, no acute disease noted.  Discussed with PCCM-NP.  SOB: due to deconditioning   - Wt loss  - Smoking cessation  Hypoxemia:  - D/C O2, no need  - If a concern may ambulate and check O2  Smoker:  - Smoking cessation  Cough:  - Protonix  -  Smoking cessation  - Treat seasonal allergies  PCCM will sign off, thank you for such an interesting consult  Patient seen and examined, agree with above note.  I dictated the care and orders written for this patient under my direction.  Rush Farmer, [place]

## 2018-10-15 NOTE — Discharge Summary (Addendum)
Discharge Summary  Erin Zamora ZHG:[id number] DOB: [birth year]  PCP: System, Pcp Not In  Admit date: 10/13/2018 Discharge date: 10/15/2018  Time spent: 35 minutes  Recommendations for Outpatient Follow-up:  1. Follow-up with your PCP 2. Follow up with pulmonology 3. Completely abstain from tobacco use 4. Take your medications as prescribed  Discharge Diagnoses:  Active Hospital Problems   Diagnosis Date Noted  . RLL pneumonia ([place]) 10/13/2018  . Elevated blood pressure reading 10/13/2018  . Obesity (BMI 30.0-34.9) 10/13/2018    Resolved Hospital Problems  No resolved problems to display.    Discharge Condition: Stable  Diet recommendation: Resume previous diet  Vitals:   10/15/18 1427 10/15/18 1633  BP:  (!) 162/93  Pulse:  (!) 116  Resp:  16  Temp:  98.3 F (36.8 C)  SpO2: 90% 90%    History of present illness:  Erin Boak Pattersonis a 48 y.o.femalewithno significantmedical historypresenting with SOB.Patient reports several days of SOB, cough, epigastric pain. She has not had a fever. She denies exposure to anyone with COVID. She did have a dental abscess on the right about 2 weeks ago and felt like the infection moved down and settled in her chest. She was given amoxicillin and completed the antibiotic.   ED Course:Cough, SOB worsening x 3-4 days. CXR negative, but hypoxia to upper 80s on 2L. CT with possible RLL infiltrate. COVID negative.  Ongoing tobacco use likely contributing to suspected undiagnosed COPD exacerbation.   10/15/18: Patient seen and examined at her bedside.  No acute events overnight.  Vital signs and labs reviewed and are stable.  Mild cough noted on exam.  Nontoxic-appearing.  No longer hypoxic.  Seen by pulmonology.  Afebrile with no leukocytosis, negative procalcitonin, independently reviewed chest x-ray and CTA PE, no pulmonary embolism.  On the day of discharge, the patient was hemodynamically stable.  She  will need to follow-up with her primary care provider and abstain from tobacco abuse.   Hospital Course:  Principal Problem:   RLL pneumonia ([place]) Active Problems:   Elevated blood pressure reading   Obesity (BMI 30.0-34.9)  Acute hypoxic respiratory failure likely secondary to undiagnosed COPD with exacerbation C/w azithromycin, po 250 mg daily x 5 days C/w prednisone 20 mg daily x 5 days C/w albuterol nebs prn bid  C/w tessalon perls prn F/u with PCP Abstain from tobacco use  Transient Elevated BP F/u with PCP  Suspected undiagnosed COPD in the setting of ongoing tobacco use x30 years PFT pending Management as stated above  Epigastric discomfort Lipase normal Avoid NSAIDs use C/w protonix 40 mg daily.  Hypokalemia, resolved post repletion K+ 3.1>>4.1 Mg2+ 2.2  Iron def anemia Iron studies show iron deficiency No sign of overt bleeding C/w ferrous sulfate 325 mg daily  Tobacco use disorder Reports smokes 1 pack every 3 days x 30 years off and on Nicotine patch at discharge  Weight gain/obesity Self-reported TSH normal BMI 37 Recommend weight loss w healthy dieting and reg exercise    Code Status:Full - confirmed with patient  Consults called:Pulmonology   Discharge Exam: BP (!) 162/93 (BP Location: Left Wrist)   Pulse (!) 116   Temp 98.3 F (36.8 C) (Oral)   Resp 16   Ht 5\' 5"  (1.651 m)   Wt 102.9 kg   SpO2 90%   BMI 37.74 kg/m  . General: 48 y.o. year-old female well developed well nourished in no acute distress.  Alert and oriented x3. . Cardiovascular: Regular rate and rhythm  with no rubs or gallops.  No thyromegaly or JVD noted.   Marland Kitchen Respiratory: Clear to auscultation with no wheezes or rales. Good inspiratory effort. . Abdomen: Soft nontender nondistended with normal bowel sounds x4 quadrants. . Musculoskeletal: No lower extremity edema. 2/4 pulses in all 4 extremities. . Skin: No ulcerative lesions noted or rashes, .  Psychiatry: Mood is appropriate for condition and setting  Discharge Instructions You were cared for by a hospitalist during your hospital stay. If you have any questions about your discharge medications or the care you received while you were in the hospital after you are discharged, you can call the unit and asked to speak with the hospitalist on call if the hospitalist that took care of you is not available. Once you are discharged, your primary care physician will handle any further medical issues. Please note that NO REFILLS for any discharge medications will be authorized once you are discharged, as it is imperative that you return to your primary care physician (or establish a relationship with a primary care physician if you do not have one) for your aftercare needs so that they can reassess your need for medications and monitor your lab values.   Allergies as of 10/15/2018      Reactions   Codeine Itching, Nausea And Vomiting      Medication List    STOP taking these medications   acetaminophen 325 MG tablet Commonly known as: TYLENOL   NyQuil Severe Cold/Flu 5-6.25-10-325 MG Caps Generic drug: Phenyleph-Doxylamine-DM-APAP     TAKE these medications   albuterol (2.5 MG/3ML) 0.083% nebulizer solution Commonly known as: PROVENTIL Take 3 mLs (2.5 mg total) by nebulization 2 (two) times daily as needed for wheezing or shortness of breath.   azithromycin 250 MG tablet Commonly known as: ZITHROMAX Please take 1 tablet 250 mg daily x 5 days Start taking on: October 16, 2018   benzonatate 100 MG capsule Commonly known as: TESSALON Take 1 capsule (100 mg total) by mouth 2 (two) times daily as needed for cough.   ferrous sulfate 325 (65 FE) MG tablet Take 1 tablet (325 mg total) by mouth daily with breakfast. Start taking on: October 16, 2018   nicotine 21 mg/24hr patch Commonly known as: NICODERM CQ - dosed in mg/24 hours Place 1 patch (21 mg total) onto the skin daily. Start  taking on: October 16, 2018   pantoprazole 40 MG tablet Commonly known as: PROTONIX Take 1 tablet (40 mg total) by mouth daily. Start taking on: October 16, 2018   predniSONE 20 MG tablet Commonly known as: DELTASONE Take 1 tablet (20 mg total) by mouth daily for 5 days.            Durable Medical Equipment  (From admission, onward)         Start     Ordered   10/15/18 1407  For home use only DME Nebulizer machine  Once    Question:  Patient needs a nebulizer to treat with the following condition  Answer:  COPD exacerbation ([place])   10/15/18 1406         Allergies  Allergen Reactions  . Codeine Itching and Nausea And Vomiting   Follow-up Information    Marshell Garfinkel, MD. Go to.   Specialty: Pulmonary Disease Why: A follow up appointment has been made with [facility] in [facility] October 29, 2018  at 9:30 AM for hospital follow up appointment with Dr. Vaughan Browner   Please call [phone] if questions  arise or if you need to reschedule  Contact information: [place] [address] [phone]        [place] Follow up.   Why: neb machine       [facility].   Specialty: Internal Medicine Why: Appointment scheduled for October 23, 2018 at 1:00 p.m. Contact information: [place] [id number] [place] [facility] .   Specialty: Respiratory Therapy Contact information: [address] [id number] [place] [phone]           The results of significant diagnostics from this hospitalization (including imaging, microbiology, ancillary and laboratory) are listed below for reference.    Significant Diagnostic Studies: Ct Angio Chest Pe W And/or Wo Contrast  Result Date: 10/13/2018 CLINICAL DATA:  Worsening dyspnea over 3-4 days, shortness of breath which is constant and worse  with exertion, orthopnea, associated cough productive of yellow to clear sputum, intermittent RIGHT mid back pain with coughing and deep breathing, former smoker, question pulmonary embolism EXAM: CT ANGIOGRAPHY CHEST WITH CONTRAST TECHNIQUE: Multidetector CT imaging of the chest was performed using the standard protocol during bolus administration of intravenous contrast. Multiplanar CT image reconstructions and MIPs were obtained to evaluate the vascular anatomy. CONTRAST:  77mL OMNIPAQUE IOHEXOL 350 MG/ML SOLN IV COMPARISON:  None FINDINGS: Cardiovascular: Minimal atherosclerotic calcification of thoracic aorta and coronary arteries. Aorta upper normal caliber 3.8 cm diameter. No aortic aneurysm or dissection. No pericardial effusion. Pulmonary arteries adequately opacified and patent. No evidence of pulmonary embolism. Mediastinum/Nodes: Esophagus normal appearance. Base of cervical region unremarkable. No thoracic adenopathy. Lungs/Pleura: Minimal central infiltrate LEFT lower lobe. Mild peribronchial thickening. Lungs otherwise clear. No additional infiltrate, pleural effusion or pneumothorax. Upper Abdomen: Unremarkable Musculoskeletal: No acute osseous findings. Review of the MIP images confirms the above findings. IMPRESSION: No evidence of pulmonary embolism. Bronchitic changes with minimal central RIGHT lower lobe infiltrate. Aortic Atherosclerosis (ICD10-I70.0). Electronically Signed   By: Lavonia Dana M.D.   On: 10/13/2018 16:34   Dg Chest Port 1 View  Result Date: 10/13/2018 CLINICAL DATA:  Coughing with low-grade fever. EXAM: PORTABLE CHEST 1 VIEW COMPARISON:  01/29/2018 FINDINGS: Cardiac silhouette is normal in size and configuration. No mediastinal or hilar masses. There is no evidence of adenopathy. Clear lungs.  No pleural effusion or pneumothorax. Skeletal structures are grossly intact. IMPRESSION: No active disease. Electronically Signed   By: Lajean Manes M.D.   On: 10/13/2018 15:24     Microbiology: Recent Results (from the past 240 hour(s))  SARS Coronavirus 2     Status: None   Collection Time: 10/13/18  2:53 PM  Result Value Ref Range Status   SARS Coronavirus 2 NOT DETECTED NOT DETECTED Final    Comment: (NOTE) SARS-CoV-2 target nucleic acids are NOT DETECTED. The SARS-CoV-2 RNA is generally detectable in upper and lower respiratory specimens during the acute phase of infection.  Negative  results do not preclude SARS-CoV-2 infection, do not rule out co-infections with other pathogens, and should not be used as the sole basis for treatment or other patient management decisions.  Negative results must be combined with clinical observations, patient history, and epidemiological information. The expected result is Not Detected. Fact Sheet for Patients: http://www.biofiredefense.com/wp-content/uploads/2020/03/BIOFIRE-COVID -19-patients.pdf Fact Sheet for Healthcare Providers: http://www.biofiredefense.com/wp-content/uploads/2020/03/BIOFIRE-COVID -19-hcp.pdf This test is not yet approved or cleared by the [place] and  has been authorized  for detection and/or diagnosis of SARS-CoV-2 by FDA under an Emergency Use Authorization (EUA).  This EUA will remain in effec t (meaning this test can be used) for the duration of  the COVID-19 declaration under Section 564(b)(1) of the Act, 21 U.S.C. section 360bbb-3(b)(1), unless the authorization is terminated or revoked sooner. Performed at [facility], [place] [address], [place], [place] [address]      Labs: Basic Metabolic Panel: Recent Labs  Lab 10/13/18 1450 10/14/18 0543 10/15/18 0615  NA 137 136 138  K 3.5 3.1* 4.1  CL 103 99 105  CO2 22 24 24   GLUCOSE 86 150* 128*  BUN 6 6 8   CREATININE 0.67 0.76 0.76  CALCIUM 9.1 8.8* 9.2  MG  --   --  2.2   Liver Function Tests: Recent Labs  Lab 10/13/18 1450  AST 22  ALT 18  ALKPHOS 63  BILITOT 0.6  PROT 6.8  ALBUMIN 3.8   Recent Labs  Lab  10/13/18 1450  LIPASE 28   No results for input(s): AMMONIA in the last 168 hours. CBC: Recent Labs  Lab 10/13/18 1450 10/14/18 0543  WBC 9.4 8.6  NEUTROABS 6.4 5.4  HGB 10.0* 9.2*  HCT 30.8* 26.6*  MCV 78.6* 80.4  PLT 680* 636*   Cardiac Enzymes: Recent Labs  Lab 10/13/18 1450  TROPONINI <0.03   BNP: BNP (last 3 results) Recent Labs    10/14/18 0940  BNP 9.3    ProBNP (last 3 results) No results for input(s): PROBNP in the last 8760 hours.  CBG: No results for input(s): GLUCAP in the last 168 hours.     Signed:  Kayleen Memos, MD [facility] 10/15/2018, 5:22 PM

## 2018-10-15 NOTE — TOC Transition Note (Addendum)
Transition of Care [facility]) - CM/SW Discharge Note   Patient Details  Name: [name] MRN: [id number] Date of Birth: 1971-03-24  Transition of Care [facility]) CM/SW Contact:  Zenon Mayo, RN Phone Number: 10/15/2018, 2:47 PM   Clinical Narrative:    Patient for discharge today, will need home neb machine, referral made to [facility] with [facility], he will bring to patient room prior to discharge.  NCM made follow up apt for patient with [facility] on [address] to establish as a new patient.  [facility] are not taking new patients, will make apt with [facility].   Final next level of care: Home/Self Care Barriers to Discharge: No Barriers Identified   Patient Goals and CMS Choice Patient states their goals for this hospitalization and ongoing recovery are:: get better   Choice offered to / list presented to : NA  Discharge Placement                       Discharge Plan and Services                DME Arranged: Nebulizer machine DME Agency: [facility] Date DME Agency Contacted: 10/15/18 Time DME Agency Contacted: [phone] Representative spoke with at DME Agency: zack HH Arranged: NA          Social Determinants of Health ([place]) Interventions     Readmission Risk Interventions No flowsheet data found.

## 2018-10-15 NOTE — Progress Notes (Signed)
PHARMACIST - PHYSICIAN COMMUNICATION DR:   [place] Crane CONCERNING: Antibiotic IV to Oral Route Change Policy  RECOMMENDATION: This patient is receiving Azithromycin by the intravenous route.  Based on criteria approved by the Pharmacy and Therapeutics Committee, the antibiotic(s) is/are being converted to the equivalent oral dose form(s).   DESCRIPTION: These criteria include:  Patient being treated for a respiratory tract infection, urinary tract infection, cellulitis or clostridium difficile associated diarrhea if on metronidazole  The patient is not neutropenic and does not exhibit a GI malabsorption state  The patient is eating (either orally or via tube) and/or has been taking other orally administered medications for a least 24 hours  The patient is improving clinically and has a Tmax < 100.5  If you have questions about this conversion, please contact the Pharmacy Department  []   [phone] )  [facility] []   [phone] )  [facility] [x]   [phone] )  [facility] []   [phone] )  [facility] []   [phone] )  [facility]

## 2018-10-16 ENCOUNTER — Ambulatory Visit (HOSPITAL_COMMUNITY)
Admission: RE | Admit: 2018-10-16 | Discharge: 2018-10-16 | Disposition: A | Discharge status: Home / Self Care | Payer: 59 | Source: Ambulatory Visit | Attending: Internal Medicine | Admitting: Internal Medicine

## 2018-10-16 ENCOUNTER — Other Ambulatory Visit: Payer: Self-pay

## 2018-10-16 DIAGNOSIS — R05 Cough: Secondary | ICD-10-CM | POA: Insufficient documentation

## 2018-10-16 DIAGNOSIS — F17210 Nicotine dependence, cigarettes, uncomplicated: Secondary | ICD-10-CM | POA: Diagnosis not present

## 2018-10-16 DIAGNOSIS — R0602 Shortness of breath: Secondary | ICD-10-CM | POA: Diagnosis not present

## 2018-10-16 LAB — PULMONARY FUNCTION TEST
DL/VA % pred: 131 %
DL/VA: 5.67 ml/min/mmHg/L
DLCO cor % pred: 94 %
DLCO cor: 20.89 ml/min/mmHg
DLCO unc % pred: 82 %
DLCO unc: 18.33 ml/min/mmHg
FEF 25-75 Post: 1.4 L/sec
FEF 25-75 Pre: 0.8 L/sec
FEF2575-%Change-Post: 74 %
FEF2575-%Pred-Post: 47 %
FEF2575-%Pred-Pre: 27 %
FEV1-%Change-Post: 17 %
FEV1-%Pred-Post: 50 %
FEV1-%Pred-Pre: 43 %
FEV1-Post: 1.52 L
FEV1-Pre: 1.29 L
FEV1FVC-%Change-Post: 9 %
FEV1FVC-%Pred-Pre: 84 %
FEV6-%Change-Post: 12 %
FEV6-%Pred-Post: 55 %
FEV6-%Pred-Pre: 49 %
FEV6-Post: 2.02 L
FEV6-Pre: 1.8 L
FEV6FVC-%Change-Post: 0 %
FEV6FVC-%Pred-Post: 102 %
FEV6FVC-%Pred-Pre: 101 %
FVC-%Change-Post: 6 %
FVC-%Pred-Post: 54 %
FVC-%Pred-Pre: 50 %
FVC-Post: 2.02 L
FVC-Pre: 1.9 L
Post FEV1/FVC ratio: 75 %
Post FEV6/FVC ratio: 100 %
Pre FEV1/FVC ratio: 68 %
Pre FEV6/FVC Ratio: 99 %

## 2018-10-16 MED ORDER — ALBUTEROL SULFATE (2.5 MG/3ML) 0.083% IN NEBU
2.5000 mg | INHALATION_SOLUTION | Freq: Once | RESPIRATORY_TRACT | Status: AC
  Administered 2018-10-16: 2.5 mg via RESPIRATORY_TRACT

## 2018-10-22 ENCOUNTER — Telehealth: Payer: Self-pay

## 2018-10-22 NOTE — Telephone Encounter (Signed)
Patient was negative for screening and will be coming into the office

## 2018-10-23 ENCOUNTER — Ambulatory Visit (INDEPENDENT_AMBULATORY_CARE_PROVIDER_SITE_OTHER): Payer: 59 | Admitting: Family Medicine

## 2018-10-23 ENCOUNTER — Encounter: Payer: Self-pay | Admitting: Family Medicine

## 2018-10-23 ENCOUNTER — Other Ambulatory Visit: Payer: Self-pay

## 2018-10-23 VITALS — BP 124/84 | HR 88 | Temp 99.0°F | Ht 65.0 in | Wt 221.0 lb

## 2018-10-23 DIAGNOSIS — Z7689 Persons encountering health services in other specified circumstances: Secondary | ICD-10-CM | POA: Diagnosis not present

## 2018-10-23 DIAGNOSIS — F419 Anxiety disorder, unspecified: Secondary | ICD-10-CM

## 2018-10-23 DIAGNOSIS — R319 Hematuria, unspecified: Secondary | ICD-10-CM

## 2018-10-23 DIAGNOSIS — Z131 Encounter for screening for diabetes mellitus: Secondary | ICD-10-CM

## 2018-10-23 DIAGNOSIS — R05 Cough: Secondary | ICD-10-CM | POA: Diagnosis not present

## 2018-10-23 DIAGNOSIS — R0989 Other specified symptoms and signs involving the circulatory and respiratory systems: Secondary | ICD-10-CM | POA: Insufficient documentation

## 2018-10-23 DIAGNOSIS — Z8701 Personal history of pneumonia (recurrent): Secondary | ICD-10-CM | POA: Diagnosis not present

## 2018-10-23 DIAGNOSIS — Z09 Encounter for follow-up examination after completed treatment for conditions other than malignant neoplasm: Secondary | ICD-10-CM

## 2018-10-23 DIAGNOSIS — N390 Urinary tract infection, site not specified: Secondary | ICD-10-CM

## 2018-10-23 DIAGNOSIS — D649 Anemia, unspecified: Secondary | ICD-10-CM | POA: Insufficient documentation

## 2018-10-23 DIAGNOSIS — R059 Cough, unspecified: Secondary | ICD-10-CM | POA: Insufficient documentation

## 2018-10-23 DIAGNOSIS — Z0000 Encounter for general adult medical examination without abnormal findings: Secondary | ICD-10-CM

## 2018-10-23 LAB — POCT URINALYSIS DIP (MANUAL ENTRY)
Glucose, UA: NEGATIVE mg/dL
Ketones, POC UA: NEGATIVE mg/dL
Nitrite, UA: NEGATIVE
Protein Ur, POC: 30 mg/dL — AB
Spec Grav, UA: 1.02 (ref 1.010–1.025)
Urobilinogen, UA: 1 E.U./dL
pH, UA: 7 (ref 5.0–8.0)

## 2018-10-23 LAB — POCT GLYCOSYLATED HEMOGLOBIN (HGB A1C): Hemoglobin A1C: 5.6 % (ref 4.0–5.6)

## 2018-10-23 MED ORDER — GUAIFENESIN ER 600 MG PO TB12: 600.0000 mg | ORAL_TABLET | Freq: Two times a day (BID) | ORAL | 1 refills | Status: DC

## 2018-10-23 MED ORDER — HYDROCODONE-HOMATROPINE 5-1.5 MG/5ML PO SYRP: 5.0000 mL | ORAL_SOLUTION | Freq: Three times a day (TID) | ORAL | 0 refills | Status: DC | PRN

## 2018-10-23 MED ORDER — SULFAMETHOXAZOLE-TRIMETHOPRIM 800-160 MG PO TABS: 1.0000 | ORAL_TABLET | Freq: Two times a day (BID) | ORAL | 0 refills | Status: DC

## 2018-10-23 MED ORDER — BUSPIRONE HCL 10 MG PO TABS: 10.0000 mg | ORAL_TABLET | Freq: Three times a day (TID) | ORAL | 1 refills | Status: DC

## 2018-10-23 NOTE — Patient Instructions (Addendum)
Sulfamethoxazole; Trimethoprim, SMX-TMP tablets What is this medicine? SULFAMETHOXAZOLE; TRIMETHOPRIM or SMX-TMP (suhl fuh meth OK suh zohl; trye METH oh prim) is a combination of a sulfonamide antibiotic and a second antibiotic, trimethoprim. It is used to treat or prevent certain kinds of bacterial infections. It will not work for colds, flu, or other viral infections. This medicine may be used for other purposes; ask your health care provider or pharmacist if you have questions. COMMON BRAND NAME(S): Bacter-Aid DS, Bactrim, Bactrim DS, Septra, Septra DS What should I tell my health care provider before I take this medicine? They need to know if you have any of these conditions: -anemia -asthma -being treated with anticonvulsants -if you frequently drink alcohol containing drinks -kidney disease -liver disease -low level of folic acid or glucose-6-phosphate dehydrogenase -poor nutrition or malabsorption -porphyria -severe allergies -thyroid disorder -an unusual or allergic reaction to sulfamethoxazole, trimethoprim, sulfa drugs, other medicines, foods, dyes, or preservatives -pregnant or trying to get pregnant -breast-feeding How should I use this medicine? Take this medicine by mouth with a full glass of water. Follow the directions on the prescription label. Take your medicine at regular intervals. Do not take it more often than directed. Do not skip doses or stop your medicine early. Talk to your pediatrician regarding the use of this medicine in children. Special care may be needed. This medicine has been used in children as young as 2 months of age. Overdosage: If you think you have taken too much of this medicine contact a poison control center or emergency room at once. NOTE: This medicine is only for you. Do not share this medicine with others. What if I miss a dose? If you miss a dose, take it as soon as you can. If it is almost time for your next dose, take only that dose. Do  not take double or extra doses. What may interact with this medicine? Do not take this medicine with any of the following medications: -aminobenzoate potassium -dofetilide -metronidazole This medicine may also interact with the following medications: -ACE inhibitors like benazepril, enalapril, lisinopril, and ramipril -birth control pills -cyclosporine -digoxin -diuretics -indomethacin -medicines for diabetes -methenamine -methotrexate -phenytoin -potassium supplements -pyrimethamine -sulfinpyrazone -tricyclic antidepressants -warfarin This list may not describe all possible interactions. Give your health care provider a list of all the medicines, herbs, non-prescription drugs, or dietary supplements you use. Also tell them if you smoke, drink alcohol, or use illegal drugs. Some items may interact with your medicine. What should I watch for while using this medicine? Tell your doctor or health care professional if your symptoms do not improve. Drink several glasses of water a day to reduce the risk of kidney problems. Do not treat diarrhea with over the counter products. Contact your doctor if you have diarrhea that lasts more than 2 days or if it is severe and watery. This medicine can make you more sensitive to the sun. Keep out of the sun. If you cannot avoid being in the sun, wear protective clothing and use a sunscreen. Do not use sun lamps or tanning beds/booths. What side effects may I notice from receiving this medicine? Side effects that you should report to your doctor or health care professional as soon as possible: -allergic reactions like skin rash or hives, swelling of the face, lips, or tongue -breathing problems -fever or chills, sore throat -irregular heartbeat, chest pain -joint or muscle pain -pain or difficulty passing urine -red pinpoint spots on skin -redness, blistering, peeling or loosening of   the skin, including inside the mouth -unusual bleeding or  bruising -unusually weak or tired -yellowing of the eyes or skin Side effects that usually do not require medical attention (report to your doctor or health care professional if they continue or are bothersome): -diarrhea -dizziness -headache -loss of appetite -nausea, vomiting -nervousness This list may not describe all possible side effects. Call your doctor for medical advice about side effects. You may report side effects to FDA at 1-800-FDA-1088. Where should I keep my medicine? Keep out of the reach of children. Store at room temperature between 20 to 25 degrees C (68 to 77 degrees F). Protect from light. Throw away any unused medicine after the expiration date. NOTE: This sheet is a summary. It may not cover all possible information. If you have questions about this medicine, talk to your doctor, pharmacist, or health care provider.  2019 Elsevier/Gold Standard (2012-11-23 14:38:26) Urinary Tract Infection, Adult A urinary tract infection (UTI) is an infection of any part of the urinary tract. The urinary tract includes:  The kidneys.  The ureters.  The bladder.  The urethra. These organs make, store, and get rid of pee (urine) in the body. What are the causes? This is caused by germs (bacteria) in your genital area. These germs grow and cause swelling (inflammation) of your urinary tract. What increases the risk? You are more likely to develop this condition if:  You have a small, thin tube (catheter) to drain pee.  You cannot control when you pee or poop (incontinence).  You are female, and: ? You use these methods to prevent pregnancy: ? A medicine that kills sperm (spermicide). ? A device that blocks sperm (diaphragm). ? You have low levels of a female hormone (estrogen). ? You are pregnant.  You have genes that add to your risk.  You are sexually active.  You take antibiotic medicines.  You have trouble peeing because of: ? A prostate that is bigger than  normal, if you are female. ? A blockage in the part of your body that drains pee from the bladder (urethra). ? A kidney stone. ? A nerve condition that affects your bladder (neurogenic bladder). ? Not getting enough to drink. ? Not peeing often enough.  You have other conditions, such as: ? Diabetes. ? A weak disease-fighting system (immune system). ? Sickle cell disease. ? Gout. ? Injury of the spine. What are the signs or symptoms? Symptoms of this condition include:  Needing to pee right away (urgently).  Peeing often.  Peeing small amounts often.  Pain or burning when peeing.  Blood in the pee.  Pee that smells bad or not like normal.  Trouble peeing.  Pee that is cloudy.  Fluid coming from the vagina, if you are female.  Pain in the belly or lower back. Other symptoms include:  Throwing up (vomiting).  No urge to eat.  Feeling mixed up (confused).  Being tired and grouchy (irritable).  A fever.  Watery poop (diarrhea). How is this treated? This condition may be treated with:  Antibiotic medicine.  Other medicines.  Drinking enough water. Follow these instructions at home:  Medicines  Take over-the-counter and prescription medicines only as told by your doctor.  If you were prescribed an antibiotic medicine, take it as told by your doctor. Do not stop taking it even if you start to feel better. General instructions  Make sure you: ? Pee until your bladder is empty. ? Do not hold pee for a long time. ?  Empty your bladder after sex. ? Wipe from front to back after pooping if you are a female. Use each tissue one time when you wipe.  Drink enough fluid to keep your pee pale yellow.  Keep all follow-up visits as told by your doctor. This is important. Contact a doctor if:  You do not get better after 1-2 days.  Your symptoms go away and then come back. Get help right away if:  You have very bad back pain.  You have very bad pain in  your lower belly.  You have a fever.  You are sick to your stomach (nauseous).  You are throwing up. Summary  A urinary tract infection (UTI) is an infection of any part of the urinary tract.  This condition is caused by germs in your genital area.  There are many risk factors for a UTI. These include having a small, thin tube to drain pee and not being able to control when you pee or poop.  Treatment includes antibiotic medicines for germs.  Drink enough fluid to keep your pee pale yellow. This information is not intended to replace advice given to you by your health care provider. Make sure you discuss any questions you have with your health care provider. Document Released: 10/05/2007 Document Revised: 10/26/2017 Document Reviewed: 10/26/2017 Elsevier Interactive Patient Education  2019 [employer].   Generalized Anxiety Disorder, Adult Generalized anxiety disorder (GAD) is a mental health disorder. People with this condition constantly worry about everyday events. Unlike normal anxiety, worry related to GAD is not triggered by a specific event. These worries also do not fade or get better with time. GAD interferes with life functions, including relationships, work, and school. GAD can vary from mild to severe. People with severe GAD can have intense waves of anxiety with physical symptoms (panic attacks). What are the causes? The exact cause of GAD is not known. What increases the risk? This condition is more likely to develop in:  Women.  People who have a family history of anxiety disorders.  People who are very shy.  People who experience very stressful life events, such as the death of a loved one.  People who have a very stressful family environment. What are the signs or symptoms? People with GAD often worry excessively about many things in their lives, such as their health and family. They may also be overly concerned about:  Doing well at work.  Being on  time.  Natural disasters.  Friendships. Physical symptoms of GAD include:  Fatigue.  Muscle tension or having muscle twitches.  Trembling or feeling shaky.  Being easily startled.  Feeling like your heart is pounding or racing.  Feeling out of breath or like you cannot take a deep breath.  Having trouble falling asleep or staying asleep.  Sweating.  Nausea, diarrhea, or irritable bowel syndrome (IBS).  Headaches.  Trouble concentrating or remembering facts.  Restlessness.  Irritability. How is this diagnosed? Your health care provider can diagnose GAD based on your symptoms and medical history. You will also have a physical exam. The health care provider will ask specific questions about your symptoms, including how severe they are, when they started, and if they come and go. Your health care provider may ask you about your use of alcohol or drugs, including prescription medicines. Your health care provider may refer you to a mental health specialist for further evaluation. Your health care provider will do a thorough examination and may perform additional tests to rule  out other possible causes of your symptoms. To be diagnosed with GAD, a person must have anxiety that:  Is out of his or her control.  Affects several different aspects of his or her life, such as work and relationships.  Causes distress that makes him or her unable to take part in normal activities.  Includes at least three physical symptoms of GAD, such as restlessness, fatigue, trouble concentrating, irritability, muscle tension, or sleep problems. Before your health care provider can confirm a diagnosis of GAD, these symptoms must be present more days than they are not, and they must last for six months or longer. How is this treated? The following therapies are usually used to treat GAD:  Medicine. Antidepressant medicine is usually prescribed for long-term daily control. Antianxiety medicines may  be added in severe cases, especially when panic attacks occur.  Talk therapy (psychotherapy). Certain types of talk therapy can be helpful in treating GAD by providing support, education, and guidance. Options include: ? Cognitive behavioral therapy (CBT). People learn coping skills and techniques to ease their anxiety. They learn to identify unrealistic or negative thoughts and behaviors and to replace them with positive ones. ? Acceptance and commitment therapy (ACT). This treatment teaches people how to be mindful as a way to cope with unwanted thoughts and feelings. ? Biofeedback. This process trains you to manage your body's response (physiological response) through breathing techniques and relaxation methods. You will work with a therapist while machines are used to monitor your physical symptoms.  Stress management techniques. These include yoga, meditation, and exercise. A mental health specialist can help determine which treatment is best for you. Some people see improvement with one type of therapy. However, other people require a combination of therapies. Follow these instructions at home:  Take over-the-counter and prescription medicines only as told by your health care provider.  Try to maintain a normal routine.  Try to anticipate stressful situations and allow extra time to manage them.  Practice any stress management or self-calming techniques as taught by your health care provider.  Do not punish yourself for setbacks or for not making progress.  Try to recognize your accomplishments, even if they are small.  Keep all follow-up visits as told by your health care provider. This is important. Contact a health care provider if:  Your symptoms do not get better.  Your symptoms get worse.  You have signs of depression, such as: ? A persistently sad, cranky, or irritable mood. ? Loss of enjoyment in activities that used to bring you joy. ? Change in weight or eating. ?  Changes in sleeping habits. ? Avoiding friends or family members. ? Loss of energy for normal tasks. ? Feelings of guilt or worthlessness. Get help right away if:  You have serious thoughts about hurting yourself or others. If you ever feel like you may hurt yourself or others, or have thoughts about taking your own life, get help right away. You can go to your nearest emergency department or call:  Your local emergency services (911 in the [place]  A suicide crisis helpline, such as the [place] at 781-012-7622. This is open 24 hours a day. Summary  Generalized anxiety disorder (GAD) is a mental health disorder that involves worry that is not triggered by a specific event.  People with GAD often worry excessively about many things in their lives, such as their health and family.  GAD may cause physical symptoms such as restlessness, trouble concentrating, sleep problems,  frequent sweating, nausea, diarrhea, headaches, and trembling or muscle twitching.  A mental health specialist can help determine which treatment is best for you. Some people see improvement with one type of therapy. However, other people require a combination of therapies. This information is not intended to replace advice given to you by your health care provider. Make sure you discuss any questions you have with your health care provider. Document Released: 08/13/2012 Document Revised: 03/08/2016 Document Reviewed: 03/08/2016 Elsevier Interactive Patient Education  2019 [employer] Inc[employer] Buspirone tablets What is this medicine? BUSPIRONE (byoo SPYE rone) is used to treat anxiety disorders. This medicine may be used for other purposes; ask your health care provider or pharmacist if you have questions. COMMON BRAND NAME(S): BuSpar What should I tell my health care provider before I take this medicine? They need to know if you have any of these conditions: -kidney or liver disease -an  unusual or allergic reaction to buspirone, other medicines, foods, dyes, or preservatives -pregnant or trying to get pregnant -breast-feeding How should I use this medicine? Take this medicine by mouth with a glass of water. Follow the directions on the prescription label. You may take this medicine with or without food. To ensure that this medicine always works the same way for you, you should take it either always with or always without food. Take your doses at regular intervals. Do not take your medicine more often than directed. Do not stop taking except on the advice of your doctor or health care professional. Talk to your pediatrician regarding the use of this medicine in children. Special care may be needed. Overdosage: If you think you have taken too much of this medicine contact a poison control center or emergency room at once. NOTE: This medicine is only for you. Do not share this medicine with others. What if I miss a dose? If you miss a dose, take it as soon as you can. If it is almost time for your next dose, take only that dose. Do not take double or extra doses. What may interact with this medicine? Do not take this medicine with any of the following medications: -linezolid -MAOIs like Carbex, Eldepryl, Marplan, Nardil, and Parnate -methylene blue -procarbazine This medicine may also interact with the following medications: -diazepam -digoxin -diltiazem -erythromycin -grapefruit juice -haloperidol -medicines for mental depression or mood problems -medicines for seizures like carbamazepine, phenobarbital and phenytoin -nefazodone -other medications for anxiety -rifampin -ritonavir -some antifungal medicines like itraconazole, ketoconazole, and voriconazole -verapamil -warfarin This list may not describe all possible interactions. Give your health care provider a list of all the medicines, herbs, non-prescription drugs, or dietary supplements you use. Also tell them if  you smoke, drink alcohol, or use illegal drugs. Some items may interact with your medicine. What should I watch for while using this medicine? Visit your doctor or health care professional for regular checks on your progress. It may take 1 to 2 weeks before your anxiety gets better. You may get drowsy or dizzy. Do not drive, use machinery, or do anything that needs mental alertness until you know how this drug affects you. Do not stand or sit up quickly, especially if you are an older patient. This reduces the risk of dizzy or fainting spells. Alcohol can make you more drowsy and dizzy. Avoid alcoholic drinks. What side effects may I notice from receiving this medicine? Side effects that you should report to your doctor or health care professional as soon as possible: -blurred vision or other  vision changes -chest pain -confusion -difficulty breathing -feelings of hostility or anger -muscle aches and pains -numbness or tingling in hands or feet -ringing in the ears -skin rash and itching -vomiting -weakness Side effects that usually do not require medical attention (report to your doctor or health care professional if they continue or are bothersome): -disturbed dreams, nightmares -headache -nausea -restlessness or nervousness -sore throat and nasal congestion -stomach upset This list may not describe all possible side effects. Call your doctor for medical advice about side effects. You may report side effects to FDA at 1-800-FDA-1088. Where should I keep my medicine? Keep out of the reach of children. Store at room temperature below 30 degrees C (86 degrees F). Protect from light. Keep container tightly closed. Throw away any unused medicine after the expiration date. NOTE: This sheet is a summary. It may not cover all possible information. If you have questions about this medicine, talk to your doctor, pharmacist, or health care provider.  2019 Elsevier/Gold Standard (2009-11-26  18:06:11)   Community-Acquired Pneumonia, Adult Pneumonia is an infection of the lungs. It causes swelling in the airways of the lungs. Mucus and fluid may also build up inside the airways. One type of pneumonia can happen while a person is in a hospital. A different type can happen when a person is not in a hospital (community-acquired pneumonia).  What are the causes?  This condition is caused by germs (viruses, bacteria, or fungi). Some types of germs can be passed from one person to another. This can happen when you breathe in droplets from the cough or sneeze of an infected person. What increases the risk? You are more likely to develop this condition if you:  Have a long-term (chronic) disease, such as: ? Chronic obstructive pulmonary disease (COPD). ? Asthma. ? Cystic fibrosis. ? Congestive heart failure. ? Diabetes. ? Kidney disease.  Have HIV.  Have sickle cell disease.  Have had your spleen removed.  Do not take good care of your teeth and mouth (poor dental hygiene).  Have a medical condition that increases the risk of breathing in droplets from your own mouth and nose.  Have a weakened body defense system (immune system).  Are a smoker.  Travel to areas where the germs that cause this illness are common.  Are around certain animals or the places they live. What are the signs or symptoms?  A dry cough.  A wet (productive) cough.  Fever.  Sweating.  Chest pain. This often happens when breathing deeply or coughing.  Fast breathing or trouble breathing.  Shortness of breath.  Shaking chills.  Feeling tired (fatigue).  Muscle aches. How is this treated? Treatment for this condition depends on many things. Most adults can be treated at home. In some cases, treatment must happen in a hospital. Treatment may include:  Medicines given by mouth or through an IV tube.  Being given extra oxygen.  Respiratory therapy. In rare cases, treatment for very  bad pneumonia may include:  Using a machine to help you breathe.  Having a procedure to remove fluid from around your lungs. Follow these instructions at home: Medicines  Take over-the-counter and prescription medicines only as told by your doctor. ? Only take cough medicine if you are losing sleep.  If you were prescribed an antibiotic medicine, take it as told by your doctor. Do not stop taking the antibiotic even if you start to feel better. General instructions   Sleep with your head and neck raised (elevated).  You can do this by sleeping in a recliner or by putting a few pillows under your head.  Rest as needed. Get at least 8 hours of sleep each night.  Drink enough water to keep your pee (urine) pale yellow.  Eat a healthy diet that includes plenty of vegetables, fruits, whole grains, low-fat dairy products, and lean protein.  Do not use any products that contain nicotine or tobacco. These include cigarettes, e-cigarettes, and chewing tobacco. If you need help quitting, ask your doctor.  Keep all follow-up visits as told by your doctor. This is important. How is this prevented? A shot (vaccine) can help prevent pneumonia. Shots are often suggested for:  People older than 47 years of age.  People older than 47 years of age who: ? Are having cancer treatment. ? Have long-term (chronic) lung disease. ? Have problems with their body's defense system. You may also prevent pneumonia if you take these actions:  Get the flu (influenza) shot every year.  Go to the dentist as often as told.  Wash your hands often. If you cannot use soap and water, use hand sanitizer. Contact a doctor if:  You have a fever.  You lose sleep because your cough medicine does not help. Get help right away if:  You are short of breath and it gets worse.  You have more chest pain.  Your sickness gets worse. This is very serious if: ? You are an older adult. ? Your body's defense system is  weak.  You cough up blood. Summary  Pneumonia is an infection of the lungs.  Most adults can be treated at home. Some will need treatment in a hospital.  Drink enough water to keep your pee pale yellow.  Get at least 8 hours of sleep each night. This information is not intended to replace advice given to you by your health care provider. Make sure you discuss any questions you have with your health care provider. Document Released: 10/05/2007 Document Revised: 12/14/2017 Document Reviewed: 12/14/2017 Elsevier Interactive Patient Education  2019 [employer]  Cough, Adult  A cough helps to clear your throat and lungs. A cough may last only 2-3 weeks (acute), or it may last longer than 8 weeks (chronic). Many different things can cause a cough. A cough may be a sign of an illness or another medical condition. Follow these instructions at home:  Pay attention to any changes in your cough.  Take medicines only as told by your doctor. ? If you were prescribed an antibiotic medicine, take it as told by your doctor. Do not stop taking it even if you start to feel better. ? Talk with your doctor before you try using a cough medicine.  Drink enough fluid to keep your pee (urine) clear or pale yellow.  If the air is dry, use a cold steam vaporizer or humidifier in your home.  Stay away from things that make you cough at work or at home.  If your cough is worse at night, try using extra pillows to raise your head up higher while you sleep.  Do not smoke, and try not to be around smoke. If you need help quitting, ask your doctor.  Do not have caffeine.  Do not drink alcohol.  Rest as needed. Contact a doctor if:  You have new problems (symptoms).  You cough up yellow fluid (pus).  Your cough does not get better after 2-3 weeks, or your cough gets worse.  Medicine does not help  your cough and you are not sleeping well.  You have pain that gets worse or pain that is not helped  with medicine.  You have a fever.  You are losing weight and you do not know why.  You have night sweats. Get help right away if:  You cough up blood.  You have trouble breathing.  Your heartbeat is very fast. This information is not intended to replace advice given to you by your health care provider. Make sure you discuss any questions you have with your health care provider. Document Released: 12/30/2010 Document Revised: 09/24/2015 Document Reviewed: 06/25/2014 Elsevier Interactive Patient Education  2019 [employer]; Hydrocodone oral syrup What is this medicine? HYDROCODONE (hye droe KOE done) is used to help relieve cough. This medicine may be used for other purposes; ask your health care provider or pharmacist if you have questions. COMMON BRAND NAME(S): Hycodan, Hydromet, Hydropane, Mycodone What should I tell my health care provider before I take this medicine? They need to know if you have any of these conditions: -Addison's disease -brain tumor -gallbladder disease -glaucoma -head injury -heart disease -history of a drug or alcohol abuse problem -history of irregular heartbeat -if you often drink alcohol -kidney disease -liver disease -low blood pressure -lung or breathing disease, like asthma -mental illness -pancreatic disease -seizures -stomach or intestine problems -thyroid disease -trouble passing urine -an unusual or allergic reaction to hydrocodone, other medicines, foods, dyes, or preservatives -pregnant or trying to get pregnant -breast-feeding How should I use this medicine? Take this medicine by mouth. Follow the directions on the prescription label. You can take it with or without food. If it upsets your stomach, take it with food. Use a specially marked spoon or container to measure each dose. Ask your pharmacist if you do not have one. Household spoons are not accurate. Do not to overfill. Rinse the measuring device with water  after each use. Take your medicine at regular intervals. Do not take it more often than directed. A special MedGuide will be given to you by the pharmacist with each prescription and refill. Be sure to read this information carefully each time. Talk to your pediatrician regarding the use of this medicine in children. This medicine is not approved for use in children. Overdosage: If you think you have taken too much of this medicine contact a poison control center or emergency room at once. NOTE: This medicine is only for you. Do not share this medicine with others. What if I miss a dose? If you miss a dose, take it as soon as you can. If it is almost time for your next dose, take only that dose. Do not take double or extra doses. What may interact with this medicine? Do not take this medicine with any of the following medications: -alcohol -antihistamines for allergy, cough and cold -certain medicines for anxiety or sleep -certain medicines for depression like amitriptyline, fluoxetine, sertraline -certain medicines for seizures like carbamazepine, phenobarbital, phenytoin, primidone -general anesthetics like halothane, isoflurane, methoxyflurane, propofol -local anesthetics like lidocaine, pramoxine, tetracaine -MAOIs like Carbex, Eldepryl, Marplan, Nardil, and Parnate -other narcotic medicines (opiates) for pain or cough -phenothiazines like chlorpromazine, mesoridazine, prochlorperazine, thioridazine This medicine may also interact with the following medications: -antiviral medicines for HIV and AIDS -atropine -certain antibiotics like clarithromycin, erythromycin -certain medicines for bladder problems like oxybutynin, tolterodine -certain medicines for fungal infections like ketoconazole and itraconazole -certain medicines for Parkinson's disease like benztropine, trihexyphenidyl -certain medicines for stomach problems like dicyclomine, hyoscyamine -  certain medicines for travel  sickness like scopolamine -ipratropium -rifampin This list may not describe all possible interactions. Give your health care provider a list of all the medicines, herbs, non-prescription drugs, or dietary supplements you use. Also tell them if you smoke, drink alcohol, or use illegal drugs. Some items may interact with your medicine. What should I watch for while using this medicine? Use exactly as directed by your doctor or health care professional. Do not take more than the recommended dose. You may develop tolerance to this medicine if you take it for a long time. Tolerance means that you will get less cough relief with time. Tell your doctor or health care professional if your symptoms do not improve or if they get worse. If you have been taking this medicine for a long time, do not suddenly stop taking it because you may develop a severe reaction. Your body becomes used to the medicine. This does NOT mean you are addicted. Addiction is a behavior related to getting and using a drug for a nonmedical reason. If your doctor wants you to stop the medicine, the dose will be slowly lowered over time to avoid any side effects. There are different types of narcotic medicines (opiates). If you take more than one type at the same time or if you are taking another medicine that also causes drowsiness, you may have more side effects. Give your health care provider a list of all medicines you use. Your doctor will tell you how much medicine to take. Do not take more medicine than directed. Call emergency for help if you have problems breathing or unusual sleepiness. You may get drowsy or dizzy. Do not drive, use machinery, or do anything that needs mental alertness until you know how this medicine affects you. Do not stand or sit up quickly, especially if you are an older patient. This reduces the risk of dizzy or fainting spells. Alcohol may interfere with the effect of this medicine. Avoid alcoholic drinks. This  medicine will cause constipation. Try to have a bowel movement at least every 2 to 3 days. If you do not have a bowel movement for 3 days, call your doctor or health care professional. Your mouth may get dry. Chewing sugarless gum or sucking hard candy, and drinking plenty of water may help. Contact your doctor if the problem does not go away or is severe. What side effects may I notice from receiving this medicine? Side effects that you should report to your doctor or health care professional as soon as possible: -allergic reactions like skin rash, itching or hives, swelling of the face, lips, or tongue -breathing problems -confusion -signs and symptoms of low blood pressure like dizziness; feeling faint or lightheaded, falls; unusually weak or tired -trouble passing urine or change in the amount of urine Side effects that usually do not require medical attention (report to your doctor or health care professional if they continue or are bothersome): -constipation -dry mouth -nausea, vomiting -tiredness This list may not describe all possible side effects. Call your doctor for medical advice about side effects. You may report side effects to FDA at 1-800-FDA-1088. Where should I keep my medicine? Keep out of the reach of children. This medicine can be abused. Keep your medicine in a safe place to protect it from theft. Do not share this medicine with anyone. Selling or giving away this medicine is dangerous and against the law. This medicine may cause accidental overdose and death if taken by other  adults, children, or pets. Mix any unused medicine with a substance like cat littler or coffee grounds. Then throw the medicine away in a sealed container like a sealed bag or a coffee can with a lid. Do not use the medicine after the expiration date. Store at room temperature between 15 and 30 degrees C (59 and 86 degrees F). Protect from light. NOTE: This sheet is a summary. It may not cover all  possible information. If you have questions about this medicine, talk to your doctor, pharmacist, or health care provider.  2019 Elsevier/Gold Standard (2016-11-10 16:00:40)

## 2018-10-23 NOTE — Progress Notes (Signed)
[name]  New Patient--Hospital Follow Up--Establish Care  Subjective:  Patient ID: Erin Zamora, female    DOB: January 15, 1971  Age: 48 y.o. MRN: [id number]  CC:  Chief Complaint  Patient presents with   Establish Care   Hospitalization Follow-up   Anxiety    HPI Erin Zamora is a 48 year old female who presents for Hospital Follow Up and to Establish Care today.   Past Medical History:  Diagnosis Date   Allergy    seasonal   Anemia    Anxiety    CAP (community acquired pneumonia) 10/2018   Cough 10/2018   Insomnia 10/2018   Current Status: Since her last ED visit on 10/13/2018 for CAP, she is doing well with no complaints. Her anxiety is moderate today. She denies suicidal ideations, homicidal ideations, or auditory hallucinations. She states that she has a frequent cough and has trouble sleeping. She has discontinued smoking cigarettes about 1 month ago. She denies fevers, chills, fatigue, recent infections, weight loss, and night sweats. She continues to have a moderate cough. She has not had any headaches, visual changes, dizziness, and falls. No chest pain, heart palpitations, and shortness of breath reported. No reports of GI problems such as nausea, vomiting, diarrhea, and constipation. She has no reports of blood in stools, dysuria and hematuria. She denies pain today.   Past Surgical History:  Procedure Laterality Date   APPENDECTOMY  1991   CHOLECYSTECTOMY  2003   TUBAL LIGATION  2003    Family History  Problem Relation Age of Onset   Alcohol abuse Mother    COPD Mother    Hypertension Mother    Alcohol abuse Father    Diabetes Father    Drug abuse Father    Heart disease Father    Hypertension Father    Kidney disease Father    Arthritis Maternal Grandmother    Arthritis Maternal Grandfather    Cancer Paternal Grandmother    Diabetes Paternal Grandmother    Heart  disease Paternal Grandmother    Diabetes Paternal Grandfather    Heart disease Paternal Grandfather    Kidney disease Paternal Grandfather    Stroke Paternal Grandfather     Social History   Socioeconomic History   Marital status: Single    Spouse name: Not on file   Number of children: Not on file   Years of education: Not on file   Highest education level: Not on file  Occupational History   Not on file  Social Needs   Financial resource strain: Not on file   Food insecurity    Worry: Not on file    Inability: Not on file   Transportation needs    Medical: Not on file    Non-medical: Not on file  Tobacco Use   Smoking status: Former Smoker    Quit date: 02/05/2012    Years since quitting: 6.7   Smokeless tobacco: Never Used  Substance and Sexual Activity   Alcohol use: Yes    Alcohol/week: 0.0 standard drinks   Drug use: No   Sexual activity: Yes    Partners: Male    Birth control/protection: None  Lifestyle   Physical activity    Days per week: Not on file    Minutes per session: Not on file   Stress: Not on file  Relationships   Social connections    Talks on phone: Not on file    Gets  together: Not on file    Attends religious service: Not on file    Active member of club or organization: Not on file    Attends meetings of clubs or organizations: Not on file    Relationship status: Not on file   Intimate partner violence    Fear of current or ex partner: Not on file    Emotionally abused: Not on file    Physically abused: Not on file    Forced sexual activity: Not on file  Other Topics Concern   Not on file  Social History Narrative   47 year old and 47 y/o daughters   86 year old granddaughter - takes care of her in the afternoon after school   Significant other - 15 years   Work: Web designer    Outpatient Medications Prior to Visit  Medication Sig Dispense Refill   albuterol (PROVENTIL) (2.5 MG/3ML) 0.083%  nebulizer solution Take 3 mLs (2.5 mg total) by nebulization 2 (two) times daily as needed for wheezing or shortness of breath. 75 mL o   benzonatate (TESSALON) 100 MG capsule Take 1 capsule (100 mg total) by mouth 2 (two) times daily as needed for cough. 20 capsule 0   ferrous sulfate 325 (65 FE) MG tablet Take 1 tablet (325 mg total) by mouth daily with breakfast. 30 tablet 0   pantoprazole (PROTONIX) 40 MG tablet Take 1 tablet (40 mg total) by mouth daily. 30 tablet 0   azithromycin (ZITHROMAX) 250 MG tablet Please take 1 tablet 250 mg daily x 5 days (Patient not taking: Reported on 10/23/2018) 5 each 0   nicotine (NICODERM CQ - DOSED IN MG/24 HOURS) 21 mg/24hr patch Place 1 patch (21 mg total) onto the skin daily. (Patient not taking: Reported on 10/23/2018) 28 patch 0   No facility-administered medications prior to visit.     Allergies  Allergen Reactions   Codeine Itching and Nausea And Vomiting    ROS Review of Systems  Constitutional: Negative.   HENT: Negative.   Eyes: Negative.   Respiratory: Negative.   Cardiovascular: Negative.   Gastrointestinal: Negative.   Endocrine: Negative.   Genitourinary: Negative.   Musculoskeletal: Negative.   Skin: Negative.   Allergic/Immunologic: Negative.   Neurological: Negative.   Hematological: Negative.   Psychiatric/Behavioral: Negative.       Objective:    Physical Exam  Constitutional: She is oriented to person, place, and time. She appears well-developed and well-nourished.  HENT:  Head: Normocephalic and atraumatic.  Eyes: Conjunctivae are normal.  Neck: Normal range of motion. Neck supple.  Cardiovascular: Normal rate, regular rhythm, normal heart sounds and intact distal pulses.  Pulmonary/Chest: Effort normal and breath sounds normal.  Abdominal: Soft. Bowel sounds are normal.  Musculoskeletal: Normal range of motion.  Neurological: She is alert and oriented to person, place, and time. She has normal reflexes.    Skin: Skin is warm and dry.  Psychiatric: She has a normal mood and affect. Her behavior is normal. Judgment and thought content normal.  Nursing note and vitals reviewed.   BP 124/84 (BP Location: Left Arm, Patient Position: Sitting, Cuff Size: Large)    Pulse 88    Temp 99 F (37.2 C) (Oral)    Ht 5\' 5"  (1.651 m)    Wt 221 lb (100.2 kg)    LMP 10/18/2018    SpO2 96%    BMI 36.78 kg/m  Wt Readings from Last 3 Encounters:  10/23/18 221 lb (100.2 kg)  10/13/18  226 lb 12.8 oz (102.9 kg)  02/02/18 230 lb (104.3 kg)     Health Maintenance Due  Topic Date Due   PAP SMEAR-Modifier  02/10/1993    There are no preventive care reminders to display for this patient.  Lab Results  Component Value Date   TSH 0.961 10/14/2018   Lab Results  Component Value Date   WBC 8.6 10/14/2018   HGB 9.2 (L) 10/14/2018   HCT 26.6 (L) 10/14/2018   MCV 80.4 10/14/2018   PLT 636 (H) 10/14/2018   Lab Results  Component Value Date   NA 138 10/15/2018   K 4.1 10/15/2018   CO2 24 10/15/2018   GLUCOSE 128 (H) 10/15/2018   BUN 8 10/15/2018   CREATININE 0.76 10/15/2018   BILITOT 0.6 10/13/2018   ALKPHOS 63 10/13/2018   AST 22 10/13/2018   ALT 18 10/13/2018   PROT 6.8 10/13/2018   ALBUMIN 3.8 10/13/2018   CALCIUM 9.2 10/15/2018   ANIONGAP 9 10/15/2018   No results found for: CHOL No results found for: HDL No results found for: LDLCALC No results found for: TRIG No results found for: [facility] Lab Results  Component Value Date   HGBA1C 5.6 10/23/2018      Assessment & Plan:   1. Hospital discharge follow-up  2. Encounter to establish care  3. History of community acquired pneumonia  4. Cough We will initiate Hycodan today.  - HYDROcodone-homatropine (HYCODAN) 5-1.5 MG/5ML syrup; Take 5 mLs by mouth every 8 (eight) hours as needed for cough.  Dispense: 120 mL; Refill: 0  5. Chest congestion We will initiate Mucinex today.  - guaiFENesin (MUCINEX) 600 MG 12 hr tablet; Take 1  tablet (600 mg total) by mouth 2 (two) times daily.  Dispense: 60 tablet; Refill: 1  6. Anxiety - Ambulatory referral to Psychiatry  7. Anemia, unspecified type  8. Urinary tract infection with hematuria, site unspecified We will initiate Septra today.  - sulfamethoxazole-trimethoprim (BACTRIM DS) 800-160 MG tablet; Take 1 tablet by mouth 2 (two) times daily.  Dispense: 14 tablet; Refill: 0  9. Screening for diabetes mellitus Negative for diabetes. She will continue to decrease foods/beverages high in sugars and carbs and follow Heart Healthy or DASH diet. Increase physical activity to at least 30 minutes cardio exercise daily.   - POCT glycosylated hemoglobin (Hb A1C) - POCT urinalysis dipstick  10. Healthcare maintenance - Hepatic Function Panel - Vitamin B12 - Vitamin D, 25-hydroxy - Lipid Panel - CBC with Differential  11. Follow up She will follow up in 1 month.   Meds ordered this encounter  Medications   sulfamethoxazole-trimethoprim (BACTRIM DS) 800-160 MG tablet    Sig: Take 1 tablet by mouth 2 (two) times daily.    Dispense:  14 tablet    Refill:  0   busPIRone (BUSPAR) 10 MG tablet    Sig: Take 1 tablet (10 mg total) by mouth 3 (three) times daily.    Dispense:  90 tablet    Refill:  1   HYDROcodone-homatropine (HYCODAN) 5-1.5 MG/5ML syrup    Sig: Take 5 mLs by mouth every 8 (eight) hours as needed for cough.    Dispense:  120 mL    Refill:  0   guaiFENesin (MUCINEX) 600 MG 12 hr tablet    Sig: Take 1 tablet (600 mg total) by mouth 2 (two) times daily.    Dispense:  60 tablet    Refill:  1    Orders Placed This Encounter  Procedures   Hepatic Function Panel   Vitamin B12   Vitamin D, 25-hydroxy   Lipid Panel   CBC with Differential   Ambulatory referral to Psychiatry   POCT glycosylated hemoglobin (Hb A1C)   POCT urinalysis dipstick     Referral Orders     Ambulatory referral to Psychiatry   Kathe Becton,  MSN, FNP-BC Patient  [facility] [place], [place] [phone]   Problem List Items Addressed This Visit    None    Visit Diagnoses    Hospital discharge follow-up    -  Primary   Encounter to establish care       History of community acquired pneumonia       Cough       Relevant Medications   HYDROcodone-homatropine (HYCODAN) 5-1.5 MG/5ML syrup   Chest congestion       Relevant Medications   guaiFENesin (MUCINEX) 600 MG 12 hr tablet   Anxiety       Relevant Medications   busPIRone (BUSPAR) 10 MG tablet   Other Relevant Orders   Ambulatory referral to Psychiatry   Anemia, unspecified type       Urinary tract infection with hematuria, site unspecified       Relevant Medications   sulfamethoxazole-trimethoprim (BACTRIM DS) 800-160 MG tablet   Screening for diabetes mellitus       Relevant Orders   POCT glycosylated hemoglobin (Hb A1C) (Completed)   POCT urinalysis dipstick (Completed)   Healthcare maintenance       Relevant Orders   Hepatic Function Panel   Vitamin B12   Vitamin D, 25-hydroxy   Lipid Panel   CBC with Differential   Follow up          Meds ordered this encounter  Medications   sulfamethoxazole-trimethoprim (BACTRIM DS) 800-160 MG tablet    Sig: Take 1 tablet by mouth 2 (two) times daily.    Dispense:  14 tablet    Refill:  0   busPIRone (BUSPAR) 10 MG tablet    Sig: Take 1 tablet (10 mg total) by mouth 3 (three) times daily.    Dispense:  90 tablet    Refill:  1   HYDROcodone-homatropine (HYCODAN) 5-1.5 MG/5ML syrup    Sig: Take 5 mLs by mouth every 8 (eight) hours as needed for cough.    Dispense:  120 mL    Refill:  0   guaiFENesin (MUCINEX) 600 MG 12 hr tablet    Sig: Take 1 tablet (600 mg total) by mouth 2 (two) times daily.    Dispense:  60 tablet    Refill:  1    Follow-up: Return in about 1 month (around 11/22/2018).    Azzie Glatter, FNP

## 2018-10-24 LAB — CBC WITH DIFFERENTIAL/PLATELET
Basophils Absolute: 0.1 10*3/uL (ref 0.0–0.2)
Basos: 1 %
EOS (ABSOLUTE): 0.2 10*3/uL (ref 0.0–0.4)
Eos: 2 %
Hematocrit: 36.8 % (ref 34.0–46.6)
Hemoglobin: 11 g/dL — ABNORMAL LOW (ref 11.1–15.9)
Immature Grans (Abs): 0.1 10*3/uL (ref 0.0–0.1)
Immature Granulocytes: 1 %
Lymphocytes Absolute: 2.3 10*3/uL (ref 0.7–3.1)
Lymphs: 21 %
MCH: 21.1 pg — ABNORMAL LOW (ref 26.6–33.0)
MCHC: 29.9 g/dL — ABNORMAL LOW (ref 31.5–35.7)
MCV: 71 fL — ABNORMAL LOW (ref 79–97)
Monocytes Absolute: 0.9 10*3/uL (ref 0.1–0.9)
Monocytes: 8 %
Neutrophils Absolute: 7.6 10*3/uL — ABNORMAL HIGH (ref 1.4–7.0)
Neutrophils: 67 %
Platelets: 693 10*3/uL — ABNORMAL HIGH (ref 150–450)
RBC: 5.22 x10E6/uL (ref 3.77–5.28)
RDW: 19.8 % — ABNORMAL HIGH (ref 11.7–15.4)
WBC: 11.1 10*3/uL — ABNORMAL HIGH (ref 3.4–10.8)

## 2018-10-24 LAB — LIPID PANEL
Chol/HDL Ratio: 4 ratio (ref 0.0–4.4)
Cholesterol, Total: 168 mg/dL (ref 100–199)
HDL: 42 mg/dL (ref >39)
LDL Calculated: 80 mg/dL (ref 0–99)
Triglycerides: 228 mg/dL — ABNORMAL HIGH (ref 0–149)
VLDL Cholesterol Cal: 46 mg/dL — ABNORMAL HIGH (ref 5–40)

## 2018-10-24 LAB — VITAMIN D 25 HYDROXY (VIT D DEFICIENCY, FRACTURES): Vit D, 25-Hydroxy: 9.9 ng/mL — ABNORMAL LOW (ref 30.0–100.0)

## 2018-10-24 LAB — HEPATIC FUNCTION PANEL
ALT: 17 IU/L (ref 0–32)
AST: 13 IU/L (ref 0–40)
Albumin: 4.6 g/dL (ref 3.8–4.8)
Alkaline Phosphatase: 66 IU/L (ref 39–117)
Bilirubin Total: 0.5 mg/dL (ref 0.0–1.2)
Bilirubin, Direct: 0.12 mg/dL (ref 0.00–0.40)
Total Protein: 6.8 g/dL (ref 6.0–8.5)

## 2018-10-24 LAB — VITAMIN B12: Vitamin B-12: 259 pg/mL (ref 232–1245)

## 2018-10-29 ENCOUNTER — Encounter: Payer: Self-pay | Admitting: Family Medicine

## 2018-10-29 ENCOUNTER — Encounter: Payer: Self-pay | Admitting: Pulmonary Disease

## 2018-10-29 ENCOUNTER — Other Ambulatory Visit: Payer: Self-pay | Admitting: Family Medicine

## 2018-10-29 ENCOUNTER — Ambulatory Visit: Payer: 59 | Admitting: Pulmonary Disease

## 2018-10-29 ENCOUNTER — Other Ambulatory Visit: Payer: Self-pay

## 2018-10-29 VITALS — BP 148/82 | HR 115 | Temp 98.6°F | Ht 65.0 in | Wt 221.2 lb

## 2018-10-29 DIAGNOSIS — G479 Sleep disorder, unspecified: Secondary | ICD-10-CM

## 2018-10-29 DIAGNOSIS — R05 Cough: Secondary | ICD-10-CM

## 2018-10-29 DIAGNOSIS — E785 Hyperlipidemia, unspecified: Secondary | ICD-10-CM

## 2018-10-29 DIAGNOSIS — J449 Chronic obstructive pulmonary disease, unspecified: Secondary | ICD-10-CM | POA: Insufficient documentation

## 2018-10-29 DIAGNOSIS — R059 Cough, unspecified: Secondary | ICD-10-CM

## 2018-10-29 DIAGNOSIS — D473 Essential (hemorrhagic) thrombocythemia: Secondary | ICD-10-CM

## 2018-10-29 DIAGNOSIS — E559 Vitamin D deficiency, unspecified: Secondary | ICD-10-CM

## 2018-10-29 DIAGNOSIS — D75839 Thrombocytosis, unspecified: Secondary | ICD-10-CM

## 2018-10-29 MED ORDER — VITAMIN D (ERGOCALCIFEROL) 1.25 MG (50000 UNIT) PO CAPS: 50000.0000 [IU] | ORAL_CAPSULE | ORAL | 6 refills | Status: DC

## 2018-10-29 MED ORDER — ANORO ELLIPTA 62.5-25 MCG/INH IN AEPB: 1.0000 | INHALATION_SPRAY | Freq: Every day | RESPIRATORY_TRACT | 5 refills | Status: DC

## 2018-10-29 MED ORDER — ANORO ELLIPTA 62.5-25 MCG/INH IN AEPB: 1.0000 | INHALATION_SPRAY | Freq: Every day | RESPIRATORY_TRACT | 0 refills | Status: DC

## 2018-10-29 NOTE — Progress Notes (Signed)
Erin Zamora    [id number]    1970-05-23  Primary Care Physician:Stroud, Ellie Lunch, FNP  Referring Physician: Azzie Glatter, FNP  Chief complaint: Consult for COPD  HPI: 48 year old smoker admitted in mid July for cough, dyspnea, hypoxia.  Treated as community-acquired pneumonia with azithromycin, prednisone.  Ruled out for COVID-19.  She had PFTs done that showed severe obstruction and referred here for further evaluation  Complains of chronic dyspnea on exertion, cough with white mucus.  Daily chest congestion Also has nocturnal snoring, daytime somnolence.  Advised to get evaluated for sleep apnea. Currently on Bactrim from primary care for UTI and referred to heamtology for thrombocytosis.  Pets: 2 cats, dogs.  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  No mold, hot tub, Jacuzzi at home. Smoking history: 20-30-pack-year smoker.  Quit in June 2020 Travel history: Previously lived in [place], [place].  No significant recent travel Relevant family history: Parents and grandparents had emphysema.  They are all smokers.  Outpatient Encounter Medications as of 10/29/2018  Medication Sig  . albuterol (PROVENTIL) (2.5 MG/3ML) 0.083% nebulizer solution Take 3 mLs (2.5 mg total) by nebulization 2 (two) times daily as needed for wheezing or shortness of breath.  . busPIRone (BUSPAR) 10 MG tablet Take 1 tablet (10 mg total) by mouth 3 (three) times daily. (Patient taking differently: Take 10 mg by mouth at bedtime. )  . ferrous sulfate 325 (65 FE) MG tablet Take 1 tablet (325 mg total) by mouth daily with breakfast.  . guaiFENesin (MUCINEX) 600 MG 12 hr tablet Take 1 tablet (600 mg total) by mouth 2 (two) times daily.  Marland Kitchen HYDROcodone-homatropine (HYCODAN) 5-1.5 MG/5ML syrup Take 5 mLs by mouth every 8 (eight) hours as needed for cough.  . pantoprazole (PROTONIX) 40 MG tablet Take 1 tablet (40  mg total) by mouth daily.  Marland Kitchen sulfamethoxazole-trimethoprim (BACTRIM DS) 800-160 MG tablet Take 1 tablet by mouth 2 (two) times daily.  . Vitamin D, Ergocalciferol, (DRISDOL) 1.25 MG (50000 UT) CAPS capsule Take 1 capsule (50,000 Units total) by mouth every 7 (seven) days.  . benzonatate (TESSALON) 100 MG capsule Take 1 capsule (100 mg total) by mouth 2 (two) times daily as needed for cough. (Patient not taking: Reported on 10/29/2018)  . nicotine (NICODERM CQ - DOSED IN MG/24 HOURS) 21 mg/24hr patch Place 1 patch (21 mg total) onto the skin daily. (Patient not taking: Reported on 10/23/2018)  . [DISCONTINUED] azithromycin (ZITHROMAX) 250 MG tablet Please take 1 tablet 250 mg daily x 5 days (Patient not taking: Reported on 10/23/2018)   No facility-administered encounter medications on file as of 10/29/2018.     Allergies as of 10/29/2018 - Review Complete 10/29/2018  Allergen Reaction Noted  . Codeine Itching and Nausea And Vomiting 02/18/2012    Past Medical History:  Diagnosis Date  . Allergy    seasonal  . Anemia   . Anxiety   . CAP (community acquired pneumonia) 10/2018  . Cough 10/2018  . Hyperlipidemia 10/2018  . Insomnia 10/2018  . Microcytic anemia   . Thrombocytosis ([place])   . Vitamin D deficiency 10/2018    Past Surgical History:  Procedure Laterality Date  . APPENDECTOMY  1991  . CHOLECYSTECTOMY  2003  . TUBAL LIGATION  2003    Family History  Problem Relation Age of Onset  . Alcohol abuse Mother   . COPD  Mother   . Hypertension Mother   . Alcohol abuse Father   . Diabetes Father   . Drug abuse Father   . Heart disease Father   . Hypertension Father   . Kidney disease Father   . Arthritis Maternal Grandmother   . Arthritis Maternal Grandfather   . Cancer Paternal Grandmother   . Diabetes Paternal Grandmother   . Heart disease Paternal Grandmother   . Diabetes Paternal Grandfather   . Heart disease Paternal Grandfather   . Kidney disease Paternal  Grandfather   . Stroke Paternal Grandfather     Social History   Socioeconomic History  . Marital status: Single    Spouse name: Not on file  . Number of children: Not on file  . Years of education: Not on file  . Highest education level: Not on file  Occupational History  . Not on file  Social Needs  . Financial resource strain: Not on file  . Food insecurity    Worry: Not on file    Inability: Not on file  . Transportation needs    Medical: Not on file    Non-medical: Not on file  Tobacco Use  . Smoking status: Former Smoker    Packs/day: 0.25    Types: Cigarettes    Quit date: 10/01/2018    Years since quitting: 0.0  . Smokeless tobacco: Never Used  . Tobacco comment: 5-7 ciggs per day  Substance and Sexual Activity  . Alcohol use: Yes    Alcohol/week: 0.0 standard drinks  . Drug use: No  . Sexual activity: Yes    Partners: Male    Birth control/protection: None  Lifestyle  . Physical activity    Days per week: Not on file    Minutes per session: Not on file  . Stress: Not on file  Relationships  . Social Herbalist on phone: Not on file    Gets together: Not on file    Attends religious service: Not on file    Active member of club or organization: Not on file    Attends meetings of clubs or organizations: Not on file    Relationship status: Not on file  . Intimate partner violence    Fear of current or ex partner: Not on file    Emotionally abused: Not on file    Physically abused: Not on file    Forced sexual activity: Not on file  Other Topics Concern  . Not on file  Social History Narrative   47 year old and 68 y/o daughters   36 year old granddaughter - takes care of her in the afternoon after school   Significant other - 15 years   Work: Web designer    Review of systems: Review of Systems  Constitutional: Negative for fever and chills.  HENT: Negative.   Eyes: Negative for blurred vision.  Respiratory: as per HPI   Cardiovascular: Negative for chest pain and palpitations.  Gastrointestinal: Negative for vomiting, diarrhea, blood per rectum. Genitourinary: Negative for dysuria, urgency, frequency and hematuria.  Musculoskeletal: Negative for myalgias, back pain and joint pain.  Skin: Negative for itching and rash.  Neurological: Negative for dizziness, tremors, focal weakness, seizures and loss of consciousness.  Endo/Heme/Allergies: Negative for environmental allergies.  Psychiatric/Behavioral: Negative for depression, suicidal ideas and hallucinations.  All other systems reviewed and are negative.  Physical Exam: Blood pressure (!) 148/82, pulse (!) 115, temperature 98.6 F (37 C), temperature source Oral, height 5'  5" (1.651 m), weight 221 lb 3.2 oz (100.3 kg), last menstrual period 10/18/2018, SpO2 96 %. Gen:      No acute distress HEENT:  EOMI, sclera anicteric Neck:     No masses; no thyromegaly Lungs:    Clear to auscultation bilaterally; normal respiratory effort CV:         Regular rate and rhythm; no murmurs Abd:      + bowel sounds; soft, non-tender; no palpable masses, no distension Ext:    No edema; adequate peripheral perfusion Skin:      Warm and dry; no rash Neuro: alert and oriented x 3 Psych: normal mood and affect  Data Reviewed: Imaging: CTA 10/13/2018- minimal central left lower lobe infiltrate.  Mild peribronchial thickening.  No pulmonary embolism.  I have reviewed the images personally.  PFTs: 10/16/18 FVC 2.02 [54%], FEV1 1.52 [50%),F/F 75, SVC 2.16 [57%], DLCO 18.33 [82%] Severe obstruction with bronchodilator response, air trapping Did not do lung volumes due to claustrophobia.  Labs: SARS-CoV-2 10/13/2018-9 Strep pneumo 10/13/18-negative  CBC 10/23/2018-WBC 11.1, eos 2%, absolute eosinophil count 222  Assessment:  COPD PFTs reviewed with severe obstruction.  We will start Anoro inhaler.   She has borderline peripheral eosinophils. Check IgE.  If elevated then  we will add inhaled steroids to her regimen Encouraged not to stop smoking again.  There is a strong family history of COPD which is likely secondary to smoking.  Will check alpha-1 antitrypsin levels and phenotype  Suspected sleep apnea Schedule split-night sleep study.  Plan/Recommendations: - Start Anoro - Check IgE, alpha-1 antitrypsin levels and phenotype - Split-night sleep study.  Marshell Garfinkel MD [facility] 10/29/2018, 9:45 AM  CC: No ref. provider found

## 2018-10-29 NOTE — Patient Instructions (Signed)
We will start you on an inhaler called Anoro We will check IgE and alpha-1 antitrypsin levels and phenotype.  We will put the order in and this can be done when you visit with the hematologist We will schedule you for split-night in lab sleep study  Follow-up in 1 to 2 months

## 2018-11-03 DIAGNOSIS — D473 Essential (hemorrhagic) thrombocythemia: Secondary | ICD-10-CM | POA: Insufficient documentation

## 2018-11-03 DIAGNOSIS — D75839 Thrombocytosis, unspecified: Secondary | ICD-10-CM | POA: Insufficient documentation

## 2018-11-03 NOTE — Progress Notes (Signed)
[facility]  Telephone:([phone] [phone]  HEMATOLOGY-ONCOLOGY TELEMEDICINE VISIT PROGRESS NOTE  ID: Erin Zamora OB: 12-Jun-1970  MR#: [id number]  BSJ#:[id number]  Patient Care Team: Azzie Glatter, FNP as PCP - General (Family Medicine)  I connected with@ on 11/09/18 at  1:30 PM EDT by video enabled telemedicine visit and verified that I am speaking with the correct person using two identifiers.   I discussed the limitations, risks, security and privacy concerns of performing an evaluation and management service by telemedicine and the availability of in-person appointments. I also discussed with the patient that there may be a patient responsible charge related to this service. The patient expressed understanding and agreed to proceed.  Other persons participating in the visit and their role in the encounter: Patient, MD  PATIENT'S LOCATION: Home PROVIDER'S LOCATION: Clinic  CHIEF COMPLAINT: Thrombocytosis  INTERVAL HISTORY: Patient is a 48 year old female who was noted to have a persistently elevated platelet count on routine blood work.  She currently feels well and is asymptomatic.  She has no neurologic complaints.  She denies any recent fevers or illnesses.  She has a good appetite and denies weight loss.  She has no chest pain, shortness of breath, cough, or hemoptysis.  She denies any nausea, vomiting, constipation, or diarrhea.  She has no urinary complaints.  Patient feels at her baseline and offers no specific complaints today.  REVIEW OF SYSTEMS:   Review of Systems  Constitutional: Negative.  Negative for fever, malaise/fatigue and weight loss.  Respiratory: Negative.  Negative for cough, hemoptysis and shortness of breath.   Cardiovascular: Negative.  Negative for chest pain and leg swelling.  Gastrointestinal: Negative.  Negative for abdominal pain.  Genitourinary: Negative.  Negative for dysuria.  Musculoskeletal: Negative.   Negative for back pain.  Skin: Negative.  Negative for rash.  Neurological: Negative.  Negative for dizziness, focal weakness, weakness and headaches.  Psychiatric/Behavioral: Negative.  The patient is not nervous/anxious.     As per HPI. Otherwise, a complete review of systems is negative.  PAST MEDICAL HISTORY: Past Medical History:  Diagnosis Date  . Allergy    seasonal  . Anemia   . Anxiety   . CAP (community acquired pneumonia) 10/2018  . Cough 10/2018  . Hyperlipidemia 10/2018  . Insomnia 10/2018  . Microcytic anemia   . Thrombocytosis ([place])   . Vitamin D deficiency 10/2018    PAST SURGICAL HISTORY: Past Surgical History:  Procedure Laterality Date  . APPENDECTOMY  1991  . CHOLECYSTECTOMY  2003  . TUBAL LIGATION  2003    FAMILY HISTORY: Family History  Problem Relation Age of Onset  . Alcohol abuse Mother   . COPD Mother   . Hypertension Mother   . Alcohol abuse Father   . Diabetes Father   . Drug abuse Father   . Heart disease Father   . Hypertension Father   . Kidney disease Father   . Arthritis Maternal Grandmother   . Arthritis Maternal Grandfather   . Cancer Paternal Grandmother   . Diabetes Paternal Grandmother   . Heart disease Paternal Grandmother   . Diabetes Paternal Grandfather   . Heart disease Paternal Grandfather   . Kidney disease Paternal Grandfather   . Stroke Paternal Grandfather     ADVANCED DIRECTIVES (Y/N):  N  HEALTH MAINTENANCE: Social History   Tobacco Use  . Smoking status: Former Smoker    Packs/day: 0.25    Types: Cigarettes    Quit date: 10/01/2018  Years since quitting: 0.1  . Smokeless tobacco: Never Used  . Tobacco comment: 5-7 ciggs per day  Substance Use Topics  . Alcohol use: Yes    Alcohol/week: 0.0 standard drinks  . Drug use: No     Colonoscopy:  PAP:  Bone density:  Lipid panel:  Allergies  Allergen Reactions  . Codeine Itching and Nausea And Vomiting    Current Outpatient Medications   Medication Sig Dispense Refill  . busPIRone (BUSPAR) 10 MG tablet Take 1 tablet (10 mg total) by mouth 3 (three) times daily. (Patient taking differently: Take 10 mg by mouth at bedtime. ) 90 tablet 1  . ferrous sulfate 325 (65 FE) MG tablet Take 1 tablet (325 mg total) by mouth daily with breakfast. 30 tablet 0  . pantoprazole (PROTONIX) 40 MG tablet Take 1 tablet (40 mg total) by mouth daily. 30 tablet 0  . umeclidinium-vilanterol (ANORO ELLIPTA) 62.5-25 MCG/INH AEPB Inhale 1 puff into the lungs daily. 60 each 5  . Vitamin D, Ergocalciferol, (DRISDOL) 1.25 MG (50000 UT) CAPS capsule Take 1 capsule (50,000 Units total) by mouth every 7 (seven) days. 5 capsule 6   No current facility-administered medications for this visit.     OBJECTIVE: There were no vitals filed for this visit.   There is no height or weight on file to calculate BMI.    ECOG FS:0 - Asymptomatic  General: Well-developed, well-nourished, no acute distress. HEENT: Normocephalic. Neuro: Alert, answering all questions appropriately. Cranial nerves grossly intact. Skin: No rashes or petechiae noted. Psych: Normal affect.  LAB RESULTS:  Lab Results  Component Value Date   NA 138 10/15/2018   K 4.1 10/15/2018   CL 105 10/15/2018   CO2 24 10/15/2018   GLUCOSE 128 (H) 10/15/2018   BUN 8 10/15/2018   CREATININE 0.76 10/15/2018   CALCIUM 9.2 10/15/2018   PROT 6.8 10/23/2018   ALBUMIN 4.6 10/23/2018   AST 13 10/23/2018   ALT 17 10/23/2018   ALKPHOS 66 10/23/2018   BILITOT 0.5 10/23/2018   GFRNONAA >60 10/15/2018   GFRAA >60 10/15/2018    Lab Results  Component Value Date   WBC 9.1 11/08/2018   NEUTROABS 7.6 (H) 10/23/2018   HGB 10.5 (L) 11/08/2018   HCT 35.8 (L) 11/08/2018   MCV 73.8 (L) 11/08/2018   PLT 632 (H) 11/08/2018     STUDIES: Ct Angio Chest Pe W And/or Wo Contrast  Result Date: 10/13/2018 CLINICAL DATA:  Worsening dyspnea over 3-4 days, shortness of breath which is constant and worse with  exertion, orthopnea, associated cough productive of yellow to clear sputum, intermittent RIGHT mid back pain with coughing and deep breathing, former smoker, question pulmonary embolism EXAM: CT ANGIOGRAPHY CHEST WITH CONTRAST TECHNIQUE: Multidetector CT imaging of the chest was performed using the standard protocol during bolus administration of intravenous contrast. Multiplanar CT image reconstructions and MIPs were obtained to evaluate the vascular anatomy. CONTRAST:  8mL OMNIPAQUE IOHEXOL 350 MG/ML SOLN IV COMPARISON:  None FINDINGS: Cardiovascular: Minimal atherosclerotic calcification of thoracic aorta and coronary arteries. Aorta upper normal caliber 3.8 cm diameter. No aortic aneurysm or dissection. No pericardial effusion. Pulmonary arteries adequately opacified and patent. No evidence of pulmonary embolism. Mediastinum/Nodes: Esophagus normal appearance. Base of cervical region unremarkable. No thoracic adenopathy. Lungs/Pleura: Minimal central infiltrate LEFT lower lobe. Mild peribronchial thickening. Lungs otherwise clear. No additional infiltrate, pleural effusion or pneumothorax. Upper Abdomen: Unremarkable Musculoskeletal: No acute osseous findings. Review of the MIP images confirms the above findings. IMPRESSION: No  evidence of pulmonary embolism. Bronchitic changes with minimal central RIGHT lower lobe infiltrate. Aortic Atherosclerosis (ICD10-I70.0). Electronically Signed   By: Lavonia Dana M.D.   On: 10/13/2018 16:34   Dg Chest Port 1 View  Result Date: 10/13/2018 CLINICAL DATA:  Coughing with low-grade fever. EXAM: PORTABLE CHEST 1 VIEW COMPARISON:  01/29/2018 FINDINGS: Cardiac silhouette is normal in size and configuration. No mediastinal or hilar masses. There is no evidence of adenopathy. Clear lungs.  No pleural effusion or pneumothorax. Skeletal structures are grossly intact. IMPRESSION: No active disease. Electronically Signed   By: Lajean Manes M.D.   On: 10/13/2018 15:24     ASSESSMENT: Thrombocytosis  PLAN:  1. Thrombocytosis: Patient's platelet count remains persistently elevated at 632.  Most likely secondary to her iron deficiency.  Jak 2 mutation has been ordered for completeness.  Return to clinic in 3 weeks for further evaluation and consideration of IV Feraheme. 2.  Iron deficiency anemia: Patient's hemoglobin is trending down and she has decreased iron stores.  Return to clinic as above for IV Feraheme.  I discussed the assessment and treatment plan with the patient. The patient was provided an opportunity to ask questions and all were answered. The patient agreed with the plan and demonstrated an understanding of the instructions.   The patient was advised to call back or seek an in-person evaluation if the symptoms worsen or if the condition fails to improve as anticipated.  I provided 45 minutes of face-to-face video visit time during this encounter, and > 50% was spent counseling as documented under my assessment & plan.  Lloyd Huger, MD 11/09/2018 7:22 AM

## 2018-11-05 ENCOUNTER — Telehealth: Payer: Self-pay | Admitting: Pulmonary Disease

## 2018-11-05 MED ORDER — ANORO ELLIPTA 62.5-25 MCG/INH IN AEPB: 1.0000 | INHALATION_SPRAY | Freq: Every day | RESPIRATORY_TRACT | 0 refills | Status: DC

## 2018-11-05 NOTE — Telephone Encounter (Signed)
Called returned to patient, she states she is not able to get the anoro filled due to the cost $120 with insurance. I informed this patient she will need to call her insurance and find out what inhalers are on her formulary. In the meantime we could provide her with a sample and a patient assistance application. Voiced understanding. Sample placed upfront with application. Nothing further is needed at this time.

## 2018-11-06 ENCOUNTER — Inpatient Hospital Stay: Payer: 59 | Attending: Oncology | Admitting: Oncology

## 2018-11-06 ENCOUNTER — Telehealth: Payer: Self-pay | Admitting: Pulmonary Disease

## 2018-11-06 ENCOUNTER — Encounter: Payer: Self-pay | Admitting: Oncology

## 2018-11-06 ENCOUNTER — Other Ambulatory Visit: Payer: Self-pay

## 2018-11-06 DIAGNOSIS — D509 Iron deficiency anemia, unspecified: Secondary | ICD-10-CM | POA: Diagnosis not present

## 2018-11-06 DIAGNOSIS — E559 Vitamin D deficiency, unspecified: Secondary | ICD-10-CM | POA: Insufficient documentation

## 2018-11-06 DIAGNOSIS — Z809 Family history of malignant neoplasm, unspecified: Secondary | ICD-10-CM | POA: Insufficient documentation

## 2018-11-06 DIAGNOSIS — F419 Anxiety disorder, unspecified: Secondary | ICD-10-CM | POA: Insufficient documentation

## 2018-11-06 DIAGNOSIS — Z87891 Personal history of nicotine dependence: Secondary | ICD-10-CM | POA: Insufficient documentation

## 2018-11-06 DIAGNOSIS — D473 Essential (hemorrhagic) thrombocythemia: Secondary | ICD-10-CM | POA: Insufficient documentation

## 2018-11-06 DIAGNOSIS — D75839 Thrombocytosis, unspecified: Secondary | ICD-10-CM

## 2018-11-06 DIAGNOSIS — G4700 Insomnia, unspecified: Secondary | ICD-10-CM | POA: Insufficient documentation

## 2018-11-06 DIAGNOSIS — Z79899 Other long term (current) drug therapy: Secondary | ICD-10-CM | POA: Insufficient documentation

## 2018-11-06 DIAGNOSIS — E785 Hyperlipidemia, unspecified: Secondary | ICD-10-CM | POA: Insufficient documentation

## 2018-11-06 NOTE — Telephone Encounter (Signed)
Yes. bevespi is fine

## 2018-11-06 NOTE — Progress Notes (Signed)
Patient here today for new evaluation regarding thrombocytosis. 

## 2018-11-06 NOTE — Telephone Encounter (Signed)
Called and spoke with pt letting her know that Dr. Vaughan Browner was fine with her switching to [facility] when she was finished with the Anoro. Stated to pt to call office when opening the last sample of the Anoro that she has and that way we can get Rx Bevespi sent to pharmacy for her before she fully ran out of the med. Pt verbalized understanding. Nothing further needed.

## 2018-11-06 NOTE — Telephone Encounter (Signed)
Patient came by office to pick up Anoro samples, and stated pharmacy had told her Charolotte Eke was her preferred formulary for Anoro.  Anoro was $125 vs. Bevespi $60. Patient requested to switch from Anoro to [facility] after she finishes Anoro samples.

## 2018-11-06 NOTE — Telephone Encounter (Signed)
Dr. Vaughan Browner, please advise if you are fine for pt to switch to [facility] once she finishes with the Anoro Samples as the Bevespi is the preferred inhaler. Thanks!

## 2018-11-08 ENCOUNTER — Inpatient Hospital Stay: Payer: 59

## 2018-11-08 ENCOUNTER — Other Ambulatory Visit: Payer: Self-pay

## 2018-11-08 DIAGNOSIS — D509 Iron deficiency anemia, unspecified: Secondary | ICD-10-CM | POA: Diagnosis not present

## 2018-11-08 DIAGNOSIS — G4700 Insomnia, unspecified: Secondary | ICD-10-CM | POA: Diagnosis not present

## 2018-11-08 DIAGNOSIS — F419 Anxiety disorder, unspecified: Secondary | ICD-10-CM | POA: Diagnosis not present

## 2018-11-08 DIAGNOSIS — D75839 Thrombocytosis, unspecified: Secondary | ICD-10-CM

## 2018-11-08 DIAGNOSIS — D473 Essential (hemorrhagic) thrombocythemia: Secondary | ICD-10-CM | POA: Diagnosis not present

## 2018-11-08 DIAGNOSIS — E785 Hyperlipidemia, unspecified: Secondary | ICD-10-CM | POA: Diagnosis not present

## 2018-11-08 DIAGNOSIS — Z87891 Personal history of nicotine dependence: Secondary | ICD-10-CM | POA: Diagnosis not present

## 2018-11-08 DIAGNOSIS — E559 Vitamin D deficiency, unspecified: Secondary | ICD-10-CM | POA: Diagnosis not present

## 2018-11-08 DIAGNOSIS — Z809 Family history of malignant neoplasm, unspecified: Secondary | ICD-10-CM | POA: Diagnosis not present

## 2018-11-08 DIAGNOSIS — Z79899 Other long term (current) drug therapy: Secondary | ICD-10-CM | POA: Diagnosis not present

## 2018-11-08 LAB — IRON AND TIBC
Iron: 49 ug/dL (ref 28–170)
Saturation Ratios: 11 % (ref 10.4–31.8)
TIBC: 463 ug/dL — ABNORMAL HIGH (ref 250–450)
UIBC: 414 ug/dL

## 2018-11-08 LAB — CBC
HCT: 35.8 % — ABNORMAL LOW (ref 36.0–46.0)
Hemoglobin: 10.5 g/dL — ABNORMAL LOW (ref 12.0–15.0)
MCH: 21.6 pg — ABNORMAL LOW (ref 26.0–34.0)
MCHC: 29.3 g/dL — ABNORMAL LOW (ref 30.0–36.0)
MCV: 73.8 fL — ABNORMAL LOW (ref 80.0–100.0)
Platelets: 632 10*3/uL — ABNORMAL HIGH (ref 150–400)
RBC: 4.85 MIL/uL (ref 3.87–5.11)
RDW: 22.3 % — ABNORMAL HIGH (ref 11.5–15.5)
WBC: 9.1 10*3/uL (ref 4.0–10.5)
nRBC: 0 % (ref 0.0–0.2)

## 2018-11-08 LAB — FERRITIN: Ferritin: 8 ng/mL — ABNORMAL LOW (ref 11–307)

## 2018-11-09 ENCOUNTER — Other Ambulatory Visit: Payer: Self-pay | Admitting: Oncology

## 2018-11-09 DIAGNOSIS — D509 Iron deficiency anemia, unspecified: Secondary | ICD-10-CM | POA: Insufficient documentation

## 2018-11-14 ENCOUNTER — Ambulatory Visit: Payer: 59 | Admitting: Family Medicine

## 2018-11-14 ENCOUNTER — Encounter (HOSPITAL_BASED_OUTPATIENT_CLINIC_OR_DEPARTMENT_OTHER): Payer: 59

## 2018-11-15 LAB — JAK2 GENOTYPR

## 2018-11-16 ENCOUNTER — Telehealth: Payer: Self-pay | Admitting: Pulmonary Disease

## 2018-11-16 MED ORDER — BEVESPI AEROSPHERE 9-4.8 MCG/ACT IN AERO: 2.0000 | INHALATION_SPRAY | Freq: Two times a day (BID) | RESPIRATORY_TRACT | 5 refills | Status: DC

## 2018-11-16 NOTE — Telephone Encounter (Signed)
Rx for Erin Zamora has been sent to pharmacy for pt. Called and spoke with pt letting her know this had been done and pt verbalized understanding. Nothing further needed.

## 2018-11-20 ENCOUNTER — Other Ambulatory Visit: Payer: Self-pay

## 2018-11-20 ENCOUNTER — Encounter: Payer: Self-pay | Admitting: Family Medicine

## 2018-11-20 ENCOUNTER — Ambulatory Visit (INDEPENDENT_AMBULATORY_CARE_PROVIDER_SITE_OTHER): Payer: 59 | Admitting: Family Medicine

## 2018-11-20 VITALS — BP 140/92 | HR 90 | Temp 98.0°F | Ht 65.0 in | Wt 226.0 lb

## 2018-11-20 DIAGNOSIS — M2550 Pain in unspecified joint: Secondary | ICD-10-CM

## 2018-11-20 DIAGNOSIS — R05 Cough: Secondary | ICD-10-CM | POA: Diagnosis not present

## 2018-11-20 DIAGNOSIS — R059 Cough, unspecified: Secondary | ICD-10-CM

## 2018-11-20 DIAGNOSIS — Z09 Encounter for follow-up examination after completed treatment for conditions other than malignant neoplasm: Secondary | ICD-10-CM

## 2018-11-20 DIAGNOSIS — D473 Essential (hemorrhagic) thrombocythemia: Secondary | ICD-10-CM

## 2018-11-20 DIAGNOSIS — D75839 Thrombocytosis, unspecified: Secondary | ICD-10-CM

## 2018-11-20 DIAGNOSIS — Z8261 Family history of arthritis: Secondary | ICD-10-CM

## 2018-11-20 DIAGNOSIS — G8929 Other chronic pain: Secondary | ICD-10-CM

## 2018-11-20 LAB — POCT URINALYSIS DIP (MANUAL ENTRY)
Bilirubin, UA: NEGATIVE
Glucose, UA: NEGATIVE mg/dL
Ketones, POC UA: NEGATIVE mg/dL
Leukocytes, UA: NEGATIVE
Nitrite, UA: NEGATIVE
Protein Ur, POC: NEGATIVE mg/dL
Spec Grav, UA: 1.025 (ref 1.010–1.025)
Urobilinogen, UA: 1 E.U./dL
pH, UA: 7 (ref 5.0–8.0)

## 2018-11-20 MED ORDER — BUSPIRONE HCL 10 MG PO TABS: 10.0000 mg | ORAL_TABLET | Freq: Two times a day (BID) | ORAL | 2 refills | Status: DC

## 2018-11-20 NOTE — Progress Notes (Signed)
Patient [facility]   Established Patient Office Visit  Subjective:  Patient ID: Erin Zamora, female    DOB: 02/13/1971  Age: 48 y.o. MRN: [id number]  CC:  Chief Complaint  Patient presents with  . Follow-up    1 month on chronic condition   . Anxiety    HPI Erin Zamora is a 48 year old female who presents for Follow Up today.   Past Medical History:  Diagnosis Date  . Allergy    seasonal  . Anemia   . Anxiety   . CAP (community acquired pneumonia) 10/2018  . Cough 10/2018  . Hyperlipidemia 10/2018  . Insomnia 10/2018  . Microcytic anemia   . Thrombocytosis ([place])   . Vitamin D deficiency 10/2018   Current Status: Since her last office visit, she is doing well with no complaints. She is currently scheduled for Iron infusion on 11/26/2018 for anemia, per Dr. Grayland Ormond, Oncologist/Hematologist. She has c/o generalized chronic pain. She is currently taking Motrin as needed.  She continues to follow up with Pulmonologist as needed. Her anxiety is moderate today. She denies suicidal ideations, homicidal ideations, or auditory hallucinations.  She denies fevers, chills, fatigue, recent infections, weight loss, and night sweats. She has not had any headaches, visual changes, dizziness, and falls. No chest pain, heart palpitations, cough and shortness of breath reported. No reports of GI problems such as nausea, vomiting, diarrhea, and constipation. She has no reports of blood in stools, dysuria and hematuria. She denies pain today.    Past Surgical History:  Procedure Laterality Date  . APPENDECTOMY  1991  . CHOLECYSTECTOMY  2003  . TUBAL LIGATION  2003    Family History  Problem Relation Age of Onset  . Alcohol abuse Mother   . COPD Mother   . Hypertension Mother   . Alcohol abuse Father   . Diabetes Father   . Drug abuse Father   . Heart disease Father   . Hypertension Father   . Kidney disease Father   .  Arthritis Maternal Grandmother   . Arthritis Maternal Grandfather   . Cancer Paternal Grandmother   . Diabetes Paternal Grandmother   . Heart disease Paternal Grandmother   . Diabetes Paternal Grandfather   . Heart disease Paternal Grandfather   . Kidney disease Paternal Grandfather   . Stroke Paternal Grandfather     Social History   Socioeconomic History  . Marital status: Single    Spouse name: Not on file  . Number of children: Not on file  . Years of education: Not on file  . Highest education level: Not on file  Occupational History  . Not on file  Social Needs  . Financial resource strain: Not on file  . Food insecurity    Worry: Not on file    Inability: Not on file  . Transportation needs    Medical: Not on file    Non-medical: Not on file  Tobacco Use  . Smoking status: Former Smoker    Packs/day: 0.25    Types: Cigarettes    Quit date: 10/01/2018    Years since quitting: 0.1  . Smokeless tobacco: Never Used  . Tobacco comment: 5-7 ciggs per day  Substance and Sexual Activity  . Alcohol use: Yes    Alcohol/week: 0.0 standard drinks  . Drug use: No  . Sexual activity: Yes    Partners: Male    Birth control/protection: None  Lifestyle  .  Physical activity    Days per week: Not on file    Minutes per session: Not on file  . Stress: Not on file  Relationships  . Social Herbalist on phone: Not on file    Gets together: Not on file    Attends religious service: Not on file    Active member of club or organization: Not on file    Attends meetings of clubs or organizations: Not on file    Relationship status: Not on file  . Intimate partner violence    Fear of current or ex partner: Not on file    Emotionally abused: Not on file    Physically abused: Not on file    Forced sexual activity: Not on file  Other Topics Concern  . Not on file  Social History Narrative   47 year old and 51 y/o daughters   50 year old granddaughter - takes care of  her in the afternoon after school   Significant other - 15 years   Work: Web designer    Outpatient Medications Prior to Visit  Medication Sig Dispense Refill  . ferrous sulfate 325 (65 FE) MG tablet Take 1 tablet (325 mg total) by mouth daily with breakfast. 30 tablet 0  . pantoprazole (PROTONIX) 40 MG tablet Take 1 tablet (40 mg total) by mouth daily. 30 tablet 0  . Vitamin D, Ergocalciferol, (DRISDOL) 1.25 MG (50000 UT) CAPS capsule Take 1 capsule (50,000 Units total) by mouth every 7 (seven) days. 5 capsule 6  . busPIRone (BUSPAR) 10 MG tablet Take 1 tablet (10 mg total) by mouth 3 (three) times daily. (Patient not taking: Reported on 11/20/2018) 90 tablet 1  . Glycopyrrolate-Formoterol (BEVESPI AEROSPHERE) 9-4.8 MCG/ACT AERO Inhale 2 puffs into the lungs 2 (two) times a day. (Patient not taking: Reported on 11/20/2018) 10.7 g 5   No facility-administered medications prior to visit.     Allergies  Allergen Reactions  . Codeine Itching and Nausea And Vomiting   ROS Review of Systems  Constitutional: Negative.   HENT: Negative.   Eyes: Negative.   Respiratory: Negative.   Cardiovascular: Negative.   Gastrointestinal: Negative.   Endocrine: Negative.   Genitourinary: Negative.   Musculoskeletal: Positive for arthralgias (generalized joint pain).  Skin: Negative.   Allergic/Immunologic: Negative.   Neurological: Negative.   Hematological: Negative.   Psychiatric/Behavioral: Negative.    Objective:    Physical Exam  Constitutional: She is oriented to person, place, and time. She appears well-developed and well-nourished.  HENT:  Head: Normocephalic and atraumatic.  Eyes: Conjunctivae are normal.  Neck: Normal range of motion. Neck supple.  Cardiovascular: Normal rate, regular rhythm, normal heart sounds and intact distal pulses.  Pulmonary/Chest: Effort normal and breath sounds normal.  Abdominal: Soft. Bowel sounds are normal.  Musculoskeletal: Normal range  of motion.  Neurological: She is alert and oriented to person, place, and time. She has normal reflexes.  Skin: Skin is warm and dry.  Psychiatric: She has a normal mood and affect. Her behavior is normal. Judgment and thought content normal.  Nursing note and vitals reviewed.   BP (!) 140/92 (BP Location: Left Arm, Patient Position: Sitting, Cuff Size: Large)   Pulse 90   Temp 98 F (36.7 C) (Oral)   Ht 5\' 5"  (1.651 m)   Wt 226 lb (102.5 kg)   LMP 10/30/2018   SpO2 96%   BMI 37.61 kg/m  Wt Readings from Last 3 Encounters:  11/20/18 226  lb (102.5 kg)  10/29/18 221 lb 3.2 oz (100.3 kg)  10/23/18 221 lb (100.2 kg)     Health Maintenance Due  Topic Date Due  . PAP SMEAR-Modifier  02/10/1993    There are no preventive care reminders to display for this patient.  Lab Results  Component Value Date   TSH 0.961 10/14/2018   Lab Results  Component Value Date   WBC 9.1 11/08/2018   HGB 10.5 (L) 11/08/2018   HCT 35.8 (L) 11/08/2018   MCV 73.8 (L) 11/08/2018   PLT 632 (H) 11/08/2018   Lab Results  Component Value Date   NA 138 10/15/2018   K 4.1 10/15/2018   CO2 24 10/15/2018   GLUCOSE 128 (H) 10/15/2018   BUN 8 10/15/2018   CREATININE 0.76 10/15/2018   BILITOT 0.5 10/23/2018   ALKPHOS 66 10/23/2018   AST 13 10/23/2018   ALT 17 10/23/2018   PROT 6.8 10/23/2018   ALBUMIN 4.6 10/23/2018   CALCIUM 9.2 10/15/2018   ANIONGAP 9 10/15/2018   Lab Results  Component Value Date   CHOL 168 10/23/2018   Lab Results  Component Value Date   HDL 42 10/23/2018   Lab Results  Component Value Date   LDLCALC 80 10/23/2018   Lab Results  Component Value Date   TRIG 228 (H) 10/23/2018   Lab Results  Component Value Date   CHOLHDL 4.0 10/23/2018   Lab Results  Component Value Date   HGBA1C 5.6 10/23/2018      Assessment & Plan:   1. Chronic pain of multiple joints - Rheumatoid Arthritis Profile  2. Family history of rheumatoid arthritis  3. Cough  4.  Thrombocytosis ([place]) Continue to follow up with Hematologist.   5. Follow up She will follow up in 6 months.  - POCT urinalysis dipstick  Meds ordered this encounter  Medications  . busPIRone (BUSPAR) 10 MG tablet    Sig: Take 1 tablet (10 mg total) by mouth 2 (two) times daily.    Dispense:  60 tablet    Refill:  2    Orders Placed This Encounter  Procedures  . Rheumatoid Arthritis Profile  . POCT urinalysis dipstick    Referral Orders  No referral(s) requested today    Kathe Becton,  MSN, FNP-BC [address] [place], [place] [address] [phone] fax    Problem List Items Addressed This Visit    None    Visit Diagnoses    Follow up    -  Primary   Relevant Orders   POCT urinalysis dipstick (Completed)      No orders of the defined types were placed in this encounter.   Follow-up: No follow-ups on file.    Azzie Glatter, FNP

## 2018-11-25 NOTE — Progress Notes (Signed)
[facility]  Telephone:([phone] [phone]  HEMATOLOGY-ONCOLOGY TELEMEDICINE VISIT PROGRESS NOTE  ID: Erin Zamora OB: 09/29/1970  MR#: [id number]  PQZ#:[id number]  Patient Care Team: Azzie Glatter, FNP as PCP - General (Family Medicine)    CHIEF COMPLAINT: Thrombocytosis, iron deficiency anemia.  INTERVAL HISTORY: Patient returns to clinic today for further evaluation, discussion of her laboratory work, and initiation of IV iron.  She continues to feel well and remains asymptomatic. She has no neurologic complaints.  She denies any recent fevers or illnesses.  She has a good appetite and denies weight loss.  She has no chest pain, shortness of breath, cough, or hemoptysis.  She denies any nausea, vomiting, constipation, or diarrhea.  She denies any melena or hematochezia.  She has no urinary complaints.  Patient offers no specific complaints today.  REVIEW OF SYSTEMS:   Review of Systems  Constitutional: Negative.  Negative for fever, malaise/fatigue and weight loss.  Respiratory: Negative.  Negative for cough, hemoptysis and shortness of breath.   Cardiovascular: Negative.  Negative for chest pain and leg swelling.  Gastrointestinal: Negative.  Negative for abdominal pain.  Genitourinary: Negative.  Negative for dysuria.  Musculoskeletal: Negative.  Negative for back pain.  Skin: Negative.  Negative for rash.  Neurological: Negative.  Negative for dizziness, focal weakness, weakness and headaches.  Psychiatric/Behavioral: Negative.  The patient is not nervous/anxious.     As per HPI. Otherwise, a complete review of systems is negative.  PAST MEDICAL HISTORY: Past Medical History:  Diagnosis Date  . Allergy    seasonal  . Anemia   . Anxiety   . CAP (community acquired pneumonia) 10/2018  . Cough 10/2018  . Hyperlipidemia 10/2018  . Insomnia 10/2018  . Microcytic anemia   . Thrombocytosis ([place])   . Vitamin D deficiency 10/2018     PAST SURGICAL HISTORY: Past Surgical History:  Procedure Laterality Date  . APPENDECTOMY  1991  . CHOLECYSTECTOMY  2003  . TUBAL LIGATION  2003    FAMILY HISTORY: Family History  Problem Relation Age of Onset  . Alcohol abuse Mother   . COPD Mother   . Hypertension Mother   . Alcohol abuse Father   . Diabetes Father   . Drug abuse Father   . Heart disease Father   . Hypertension Father   . Kidney disease Father   . Arthritis Maternal Grandmother   . Arthritis Maternal Grandfather   . Cancer Paternal Grandmother   . Diabetes Paternal Grandmother   . Heart disease Paternal Grandmother   . Diabetes Paternal Grandfather   . Heart disease Paternal Grandfather   . Kidney disease Paternal Grandfather   . Stroke Paternal Grandfather     ADVANCED DIRECTIVES (Y/N):  N  HEALTH MAINTENANCE: Social History   Tobacco Use  . Smoking status: Former Smoker    Packs/day: 0.25    Types: Cigarettes    Quit date: 10/01/2018    Years since quitting: 0.1  . Smokeless tobacco: Never Used  . Tobacco comment: 5-7 ciggs per day  Substance Use Topics  . Alcohol use: Yes    Alcohol/week: 0.0 standard drinks  . Drug use: No     Colonoscopy:  PAP:  Bone density:  Lipid panel:  Allergies  Allergen Reactions  . Codeine Itching and Nausea And Vomiting    Current Outpatient Medications  Medication Sig Dispense Refill  . busPIRone (BUSPAR) 10 MG tablet Take 1 tablet (10 mg total) by mouth 2 (two) times  daily. 60 tablet 2  . Glycopyrrolate-Formoterol (BEVESPI AEROSPHERE) 9-4.8 MCG/ACT AERO Inhale 2 puffs into the lungs 2 (two) times a day. 10.7 g 5  . pantoprazole (PROTONIX) 40 MG tablet Take 1 tablet (40 mg total) by mouth daily. 30 tablet 0  . Vitamin D, Ergocalciferol, (DRISDOL) 1.25 MG (50000 UT) CAPS capsule Take 1 capsule (50,000 Units total) by mouth every 7 (seven) days. 5 capsule 6  . ferrous sulfate 325 (65 FE) MG tablet Take 1 tablet (325 mg total) by mouth daily with  breakfast. (Patient not taking: Reported on 11/26/2018) 30 tablet 0   No current facility-administered medications for this visit.     OBJECTIVE: Vitals:   11/26/18 1337  BP: (!) 141/88  Pulse: 68  Resp: 18  Temp: 98.5 F (36.9 C)     Body mass index is 37.69 kg/m.    ECOG FS:0 - Asymptomatic  General: Well-developed, well-nourished, no acute distress. Eyes: Pink conjunctiva, anicteric sclera. HEENT: Normocephalic, moist mucous membranes, clear oropharnyx. Lungs: Clear to auscultation bilaterally. Heart: Regular rate and rhythm. No rubs, murmurs, or gallops. Abdomen: Soft, nontender, nondistended. No organomegaly noted, normoactive bowel sounds. Musculoskeletal: No edema, cyanosis, or clubbing. Neuro: Alert, answering all questions appropriately. Cranial nerves grossly intact. Skin: No rashes or petechiae noted. Psych: Normal affect. Lymphatics: No cervical, calvicular, axillary or inguinal LAD.  LAB RESULTS:  Lab Results  Component Value Date   NA 138 10/15/2018   K 4.1 10/15/2018   CL 105 10/15/2018   CO2 24 10/15/2018   GLUCOSE 128 (H) 10/15/2018   BUN 8 10/15/2018   CREATININE 0.76 10/15/2018   CALCIUM 9.2 10/15/2018   PROT 6.8 10/23/2018   ALBUMIN 4.6 10/23/2018   AST 13 10/23/2018   ALT 17 10/23/2018   ALKPHOS 66 10/23/2018   BILITOT 0.5 10/23/2018   GFRNONAA >60 10/15/2018   GFRAA >60 10/15/2018    Lab Results  Component Value Date   WBC 9.1 11/08/2018   NEUTROABS 7.6 (H) 10/23/2018   HGB 10.5 (L) 11/08/2018   HCT 35.8 (L) 11/08/2018   MCV 73.8 (L) 11/08/2018   PLT 632 (H) 11/08/2018   Lab Results  Component Value Date   IRON 49 11/08/2018   TIBC 463 (H) 11/08/2018   IRONPCTSAT 11 11/08/2018   Lab Results  Component Value Date   FERRITIN 8 (L) 11/08/2018     STUDIES: No results found.  ASSESSMENT: Thrombocytosis, iron deficiency anemia.  PLAN:  1.  Iron deficiency anemia: Patient's hemoglobin is trending down and she has decreased  iron stores.  She had a trial of oral iron supplementation but did not improve her blood counts.  Proceed with 510 mg IV Feraheme today.  Return to clinic in 1 week for second infusion and then in 3 months for repeat laboratory work, further evaluation, and consideration of additional treatment. 2. Thrombocytosis: Patient's platelet count remains persistently elevated at 632.  Most likely secondary to her iron deficiency.  Jak 2 mutation is negative.  Proceed with Feraheme as above.    I discussed the assessment and treatment plan with the patient. The patient was provided an opportunity to ask questions and all were answered. The patient agreed with the plan and demonstrated an understanding of the instructions.    The patient was advised to call back or seek an in-person evaluation if the symptoms worsen or if the condition fails to improve as anticipated.  I spent a total of 30 minutes face-to-face with the patient of which greater  than 50% of the visit was spent in counseling and coordination of care as detailed above.   Lloyd Huger, MD 11/28/2018 7:06 AM

## 2018-11-26 ENCOUNTER — Encounter: Payer: Self-pay | Admitting: Oncology

## 2018-11-26 ENCOUNTER — Inpatient Hospital Stay (HOSPITAL_BASED_OUTPATIENT_CLINIC_OR_DEPARTMENT_OTHER): Payer: 59 | Admitting: Oncology

## 2018-11-26 ENCOUNTER — Inpatient Hospital Stay: Payer: 59

## 2018-11-26 ENCOUNTER — Other Ambulatory Visit: Payer: Self-pay

## 2018-11-26 VITALS — BP 141/88 | HR 68 | Temp 98.5°F | Resp 18 | Wt 226.5 lb

## 2018-11-26 DIAGNOSIS — E785 Hyperlipidemia, unspecified: Secondary | ICD-10-CM

## 2018-11-26 DIAGNOSIS — Z79899 Other long term (current) drug therapy: Secondary | ICD-10-CM

## 2018-11-26 DIAGNOSIS — F419 Anxiety disorder, unspecified: Secondary | ICD-10-CM

## 2018-11-26 DIAGNOSIS — G4700 Insomnia, unspecified: Secondary | ICD-10-CM

## 2018-11-26 DIAGNOSIS — D509 Iron deficiency anemia, unspecified: Secondary | ICD-10-CM

## 2018-11-26 DIAGNOSIS — E559 Vitamin D deficiency, unspecified: Secondary | ICD-10-CM

## 2018-11-26 DIAGNOSIS — D473 Essential (hemorrhagic) thrombocythemia: Secondary | ICD-10-CM | POA: Diagnosis not present

## 2018-11-26 DIAGNOSIS — Z809 Family history of malignant neoplasm, unspecified: Secondary | ICD-10-CM

## 2018-11-26 DIAGNOSIS — Z87891 Personal history of nicotine dependence: Secondary | ICD-10-CM

## 2018-11-26 MED ORDER — IRON SUCROSE 20 MG/ML IV SOLN
200.0000 mg | Freq: Once | INTRAVENOUS | Status: AC
  Administered 2018-11-26: 200 mg via INTRAVENOUS
  Filled 2018-11-26: qty 10

## 2018-11-26 MED ORDER — SODIUM CHLORIDE 0.9 % IV SOLN
Freq: Once | INTRAVENOUS | Status: AC
  Administered 2018-11-26: 14:00:00 via INTRAVENOUS
  Filled 2018-11-26: qty 250

## 2018-11-26 NOTE — Progress Notes (Signed)
Pt here for follow up. States is feeling tired today due to not sleeping well at night. Continues to have chronic joint pain and muscle cramps.

## 2018-12-04 ENCOUNTER — Telehealth: Payer: Self-pay

## 2018-12-04 ENCOUNTER — Other Ambulatory Visit: Payer: Self-pay

## 2018-12-04 NOTE — Telephone Encounter (Signed)
Patient would like to know what the next steps are because she is still having a lot of joint pain.

## 2018-12-04 NOTE — Telephone Encounter (Signed)
-----   Message from Azzie Glatter, [place] sent at 12/04/2018  1:09 PM EDT ----- Regarding: "Labs" Lab results for Rheumatoid arthritis is negative. Patient is to continue OTC Acetaminophen and Motrin as needed for joint pain. Keep follow up appointment as scheduled. Please inform patient.

## 2018-12-05 ENCOUNTER — Inpatient Hospital Stay: Payer: 59 | Attending: Oncology

## 2018-12-05 ENCOUNTER — Other Ambulatory Visit: Payer: Self-pay | Admitting: Family Medicine

## 2018-12-05 ENCOUNTER — Other Ambulatory Visit: Payer: Self-pay

## 2018-12-05 VITALS — BP 135/96 | HR 78 | Temp 98.8°F | Resp 18

## 2018-12-05 DIAGNOSIS — Z79899 Other long term (current) drug therapy: Secondary | ICD-10-CM | POA: Diagnosis not present

## 2018-12-05 DIAGNOSIS — G8929 Other chronic pain: Secondary | ICD-10-CM

## 2018-12-05 DIAGNOSIS — D509 Iron deficiency anemia, unspecified: Secondary | ICD-10-CM | POA: Insufficient documentation

## 2018-12-05 MED ORDER — IRON SUCROSE 20 MG/ML IV SOLN
200.0000 mg | Freq: Once | INTRAVENOUS | Status: AC
  Administered 2018-12-05: 200 mg via INTRAVENOUS
  Filled 2018-12-05: qty 10

## 2018-12-05 MED ORDER — SODIUM CHLORIDE 0.9 % IV SOLN
Freq: Once | INTRAVENOUS | Status: AC
  Administered 2018-12-05: 14:00:00 via INTRAVENOUS
  Filled 2018-12-05: qty 250

## 2018-12-05 NOTE — Progress Notes (Signed)
1330: Per Dr. Grayland Ormond pt to receive Venofer today.  Pt tolerated infusion well. Pt stable at discharge.

## 2018-12-05 NOTE — Telephone Encounter (Signed)
Patient would like a referral to Physical Therapy

## 2018-12-19 ENCOUNTER — Other Ambulatory Visit: Payer: Self-pay

## 2018-12-19 ENCOUNTER — Ambulatory Visit: Payer: 59 | Attending: Family Medicine | Admitting: Physical Therapy

## 2018-12-19 VITALS — HR 83

## 2018-12-19 DIAGNOSIS — M79661 Pain in right lower leg: Secondary | ICD-10-CM | POA: Insufficient documentation

## 2018-12-19 DIAGNOSIS — G8929 Other chronic pain: Secondary | ICD-10-CM | POA: Diagnosis present

## 2018-12-19 DIAGNOSIS — M79662 Pain in left lower leg: Secondary | ICD-10-CM | POA: Diagnosis present

## 2018-12-19 DIAGNOSIS — M7918 Myalgia, other site: Secondary | ICD-10-CM | POA: Diagnosis present

## 2018-12-19 DIAGNOSIS — M79601 Pain in right arm: Secondary | ICD-10-CM

## 2018-12-19 DIAGNOSIS — M79602 Pain in left arm: Secondary | ICD-10-CM | POA: Diagnosis present

## 2018-12-19 NOTE — Patient Instructions (Signed)

## 2018-12-20 ENCOUNTER — Encounter: Payer: Self-pay | Admitting: Physical Therapy

## 2018-12-20 NOTE — Therapy (Signed)
[place], [place], [address] Phone: [phone]   Fax:  [phone]  Physical Therapy Evaluation/Discharge  Patient Details  Name: Erin Zamora MRN: [id number] Date of Birth: 06/08/1970 Referring Provider (PT): Dr. Kathe Becton    Encounter Date: 12/19/2018  PT End of Session - 12/19/18 1427    Visit Number  1    Number of Visits  1    PT Start Time  1400    PT Stop Time  1445    PT Time Calculation (min)  45 min    Activity Tolerance  Patient limited by pain    Behavior During Therapy  [facility] for tasks assessed/performed       Past Medical History:  Diagnosis Date  . Allergy    seasonal  . Anemia   . Anxiety   . CAP (community acquired pneumonia) 10/2018  . Cough 10/2018  . Hyperlipidemia 10/2018  . Insomnia 10/2018  . Microcytic anemia   . Thrombocytosis ([place])   . Vitamin D deficiency 10/2018    Past Surgical History:  Procedure Laterality Date  . APPENDECTOMY  1991  . CHOLECYSTECTOMY  2003  . TUBAL LIGATION  2003    Vitals:   12/19/18 1427  Pulse: 83  SpO2: 97%     Subjective Assessment - 12/19/18 1402    Subjective  Pt was hospitalized with pneumonia June 2020. She saw her MD for joint pain and a follow up from the hospitalization.  Chronic pain "all the time" everywhere. She has mod to severe joint pain with all aspects of functional mobility.  She has a physically demanding job and continues to work full time.    Pertinent History  COPD, emphysema, family history of RA and Ankylosing Spondylosis, thrombocytotosis, anemia    Limitations  Sitting;Lifting;Standing;Walking;House hold activities    How long can you stand comfortably?  5 -10 min    Diagnostic tests  blood testing, no other XR    Currently in Pain?  Yes    Pain Location  Other (Comment)   multiple joints        [facility] PT Assessment - 12/20/18 0001      Assessment   Medical Diagnosis  chronic multiple  joint pain     Referring Provider (PT)  Dr. Kathe Becton     Onset Date/Surgical Date  --   2 yrs    Hand Dominance  Right    Next MD Visit  6 mos     Prior Therapy  No       Precautions   Precautions  None    Precaution Comments  highly sensitive       Restrictions   Weight Bearing Restrictions  No      Balance Screen   Has the patient fallen in the past 6 months  No      [place] residence      Prior Function   Level of Independence  Independent    Vocation  Full time employment    Tax inspector at [employer]   Overall Cognitive Status  Within Functional Limits for tasks assessed      Observation/Other Assessments   Focus on Therapeutic Outcomes (FOTO)   NT due to multiple joints      Observation/Other Assessments-Edema    Edema  --   gross ankle swelling      Sensation  Light Touch  Appears Intact      Posture/Postural Control   Posture Comments  guarded in upper body       Strength   Overall Strength Comments  UE WNL , LE WNL       Palpation   Palpation comment  pain with light palpation to joint, muscles in UE and LE , grossly                Objective measurements completed on examination: See above findings.      [facility] PT Treatment/Exercise - 12/20/18 0001      Self-Care   Self-Care  Other Self-Care Comments    Other Self-Care Comments   POC, eval findings, possible causes of joint pain, TENS unit , resources       Lumbar Exercises: Aerobic   Nustep  6 min UE and LE L1 , HR 87, o2 97%      Modalities   Modalities  Electrical Stimulation      Electrical Stimulation   Electrical Stimulation Location  lower back    Electrical Stimulation Action  IFC x 12    Electrical Stimulation Parameters  to tol    Electrical Stimulation Goals  Pain             PT Education - 12/19/18 1443    Education Details  PT/POC, functional medicine/ environmental factors  , TENS unit    Person(s) Educated  Patient    Methods  Explanation;Handout    Comprehension  Verbalized understanding                  Plan - 12/20/18 1134    Clinical Impression Statement  Patient with high complexity eval of multi joint pain.  She has pain in muscles and joints out of proportion to a stimulus.  She has normal strength and ROM of UE and LE. HR and O2 sats were normal on cardio machine. IFC did not impact her pain.  She likely needs further workup to see if there is underlying issue related to an autoimmune condition or some sort of toxicity from her workplace.  She was agreeable.  PT not indicated.    Personal Factors and Comorbidities  Comorbidity 1;Social Background;Time since onset of injury/illness/exacerbation    Examination-Activity Limitations  Squat;Stairs;Lift;Stand    Examination-Participation Restrictions  Community Activity;Yard Work;Meal Prep;Cleaning;Interpersonal Relationship    Stability/Clinical Decision Making  Unstable/Unpredictable    Clinical Decision Making  High    Rehab Potential  Fair    PT Frequency  One time visit    PT Next Visit Plan  NA    PT Home Exercise Plan  NA, given info on TENS, general exercise    Consulted and Agree with Plan of Care  Patient       Patient will benefit from skilled therapeutic intervention in order to improve the following deficits and impairments:  Pain, Increased muscle spasms, Impaired tone, Increased edema, Obesity  Visit Diagnosis: Pain in both upper extremities  Pain in both lower legs  Chronic myofascial pain     Problem List Patient Active Problem List   Diagnosis Date Noted  . Iron deficiency anemia 11/09/2018  . Thrombocytosis ([place]) 11/03/2018  . COPD with chronic bronchitis and emphysema ([place]) 10/29/2018  . Cough 10/23/2018  . Chest congestion 10/23/2018  . Anxiety 10/23/2018  . Anemia 10/23/2018  . RLL pneumonia ([place]) 10/13/2018  . Elevated blood pressure reading 10/13/2018   . Obesity (BMI 30.0-34.9) 10/13/2018  . Vitamin  D deficiency 10/2018    Brilyn Tuller,Claryssa 12/20/2018, 12:02 PM  [facility] [address] [place], [place], [address] Phone: [phone]   Fax:  [phone]  Name: Erin Zamora MRN: [id number] Date of Birth: [birth year]   Emilygrace Grothe, PT 12/20/18 12:03 PM Phone: [phone] Fax: [phone]

## 2018-12-21 ENCOUNTER — Encounter (HOSPITAL_COMMUNITY): Payer: Self-pay

## 2018-12-24 ENCOUNTER — Other Ambulatory Visit (HOSPITAL_COMMUNITY): Payer: 59

## 2018-12-26 ENCOUNTER — Encounter (HOSPITAL_BASED_OUTPATIENT_CLINIC_OR_DEPARTMENT_OTHER): Payer: 59 | Admitting: Cardiology

## 2019-01-01 ENCOUNTER — Ambulatory Visit (HOSPITAL_COMMUNITY)
Admission: RE | Admit: 2019-01-01 | Discharge: 2019-01-01 | Disposition: A | Discharge status: Home / Self Care | Payer: 59 | Source: Ambulatory Visit | Attending: Family Medicine | Admitting: Family Medicine

## 2019-01-01 ENCOUNTER — Encounter: Payer: Self-pay | Admitting: Family Medicine

## 2019-01-01 ENCOUNTER — Ambulatory Visit (INDEPENDENT_AMBULATORY_CARE_PROVIDER_SITE_OTHER): Payer: 59 | Admitting: Family Medicine

## 2019-01-01 ENCOUNTER — Other Ambulatory Visit: Payer: Self-pay

## 2019-01-01 VITALS — BP 123/73 | HR 87 | Temp 98.1°F | Ht 65.0 in | Wt 224.6 lb

## 2019-01-01 DIAGNOSIS — M5442 Lumbago with sciatica, left side: Secondary | ICD-10-CM | POA: Insufficient documentation

## 2019-01-01 DIAGNOSIS — R200 Anesthesia of skin: Secondary | ICD-10-CM | POA: Insufficient documentation

## 2019-01-01 DIAGNOSIS — G8929 Other chronic pain: Secondary | ICD-10-CM | POA: Diagnosis present

## 2019-01-01 DIAGNOSIS — R202 Paresthesia of skin: Secondary | ICD-10-CM

## 2019-01-01 DIAGNOSIS — Z09 Encounter for follow-up examination after completed treatment for conditions other than malignant neoplasm: Secondary | ICD-10-CM | POA: Diagnosis not present

## 2019-01-01 MED ORDER — IBUPROFEN 800 MG PO TABS: 800.0000 mg | ORAL_TABLET | Freq: Three times a day (TID) | ORAL | 3 refills | Status: DC | PRN

## 2019-01-01 MED ORDER — GABAPENTIN 100 MG PO CAPS: 100.0000 mg | ORAL_CAPSULE | Freq: Three times a day (TID) | ORAL | 3 refills | Status: DC

## 2019-01-01 MED ORDER — KETOROLAC TROMETHAMINE 30 MG/ML IJ SOLN: 30.0000 mg | Freq: Once | INTRAMUSCULAR | Status: AC

## 2019-01-01 NOTE — Progress Notes (Signed)
Patient Erin Zamora [facility]   Sick Visit  Subjective:  Patient ID: Erin Zamora, female    DOB: 1971/02/20  Age: 48 y.o. MRN: [id number]  CC:  Chief Complaint  Patient presents with  . Back Pain    back pain going in left leg in thigh, numbness, can't bare weight on it x 4 days     HPI Erin Zamora is a 48 year old female who presents for Sick Visit today.   Past Medical History:  Diagnosis Date  . Allergy    seasonal  . Anemia   . Anxiety   . CAP (community acquired pneumonia) 10/2018  . Cough 10/2018  . Hyperlipidemia 10/2018  . Insomnia 10/2018  . Microcytic anemia   . Thrombocytosis ([place])   . Vitamin D deficiency 10/2018   Current Status: Since her last office visit, she has been having increasing chronic left lower back pain X 3 days now. She was recently referred to PT and attempted the TENS unit. She denies fevers, chills, fatigue, recent infections, weight loss, and night sweats. She has not had any headaches, visual changes, dizziness, and falls. No chest pain, heart palpitations, cough and shortness of breath reported. No reports of GI problems such as nausea, vomiting, diarrhea, and constipation. She has no reports of blood in stools, dysuria and hematuria. No depression or anxiety, and denies suicidal ideations, homicidal ideations, or auditory hallucinations. She denies pain today.   Past Surgical History:  Procedure Laterality Date  . APPENDECTOMY  1991  . CHOLECYSTECTOMY  2003  . TUBAL LIGATION  2003    Family History  Problem Relation Age of Onset  . Alcohol abuse Mother   . COPD Mother   . Hypertension Mother   . Alcohol abuse Father   . Diabetes Father   . Drug abuse Father   . Heart disease Father   . Hypertension Father   . Kidney disease Father   . Arthritis Maternal Grandmother   . Arthritis Maternal Grandfather   . Cancer Paternal Grandmother   . Diabetes Paternal Grandmother   . Heart  disease Paternal Grandmother   . Diabetes Paternal Grandfather   . Heart disease Paternal Grandfather   . Kidney disease Paternal Grandfather   . Stroke Paternal Grandfather     Social History   Socioeconomic History  . Marital status: Single    Spouse name: Not on file  . Number of children: Not on file  . Years of education: Not on file  . Highest education level: Not on file  Occupational History  . Not on file  Social Needs  . Financial resource strain: Not on file  . Food insecurity    Worry: Not on file    Inability: Not on file  . Transportation needs    Medical: Not on file    Non-medical: Not on file  Tobacco Use  . Smoking status: Former Smoker    Packs/day: 0.25    Types: Cigarettes    Quit date: 10/01/2018    Years since quitting: 0.2  . Smokeless tobacco: Never Used  . Tobacco comment: 5-7 ciggs per day  Substance and Sexual Activity  . Alcohol use: Yes    Alcohol/week: 0.0 standard drinks  . Drug use: No  . Sexual activity: Yes    Partners: Male    Birth control/protection: None  Lifestyle  . Physical activity    Days per week: Not on file  Minutes per session: Not on file  . Stress: Not on file  Relationships  . Social Herbalist on phone: Not on file    Gets together: Not on file    Attends religious service: Not on file    Active member of club or organization: Not on file    Attends meetings of clubs or organizations: Not on file    Relationship status: Not on file  . Intimate partner violence    Fear of current or ex partner: Not on file    Emotionally abused: Not on file    Physically abused: Not on file    Forced sexual activity: Not on file  Other Topics Concern  . Not on file  Social History Narrative   47 year old and 52 y/o daughters   41 year old granddaughter - takes care of her in the afternoon after school   Significant other - 15 years   Work: Web designer    Outpatient Medications Prior to Visit   Medication Sig Dispense Refill  . busPIRone (BUSPAR) 10 MG tablet Take 1 tablet (10 mg total) by mouth 2 (two) times daily. 60 tablet 2  . Glycopyrrolate-Formoterol (BEVESPI AEROSPHERE) 9-4.8 MCG/ACT AERO Inhale 2 puffs into the lungs 2 (two) times a day. 10.7 g 5  . Vitamin D, Ergocalciferol, (DRISDOL) 1.25 MG (50000 UT) CAPS capsule Take 1 capsule (50,000 Units total) by mouth every 7 (seven) days. 5 capsule 6  . ferrous sulfate 325 (65 FE) MG tablet Take 1 tablet (325 mg total) by mouth daily with breakfast. (Patient not taking: Reported on 01/01/2019) 30 tablet 0  . pantoprazole (PROTONIX) 40 MG tablet Take 1 tablet (40 mg total) by mouth daily. (Patient not taking: Reported on 12/19/2018) 30 tablet 0   No facility-administered medications prior to visit.     Allergies  Allergen Reactions  . Codeine Itching and Nausea And Vomiting    ROS Review of Systems  Constitutional: Negative.   HENT: Negative.   Eyes: Negative.   Respiratory: Negative.   Cardiovascular: Negative.   Gastrointestinal: Positive for abdominal distention.  Endocrine: Negative.   Genitourinary: Negative.   Musculoskeletal: Positive for back pain (chronic).  Skin: Negative.   Allergic/Immunologic: Negative.   Neurological: Positive for weakness (left lower back and left lower extremities) and numbness (numbness and tingling in left lower spine and left lower extremity).  Hematological: Negative.   Psychiatric/Behavioral: Negative.       Objective:    Physical Exam  Constitutional: She is oriented to person, place, and time. She appears well-developed and well-nourished.  HENT:  Head: Normocephalic and atraumatic.  Eyes: Conjunctivae are normal.  Neck: Normal range of motion. Neck supple.  Cardiovascular: Normal rate, regular rhythm, normal heart sounds and intact distal pulses.  Pulmonary/Chest: Effort normal and breath sounds normal.  Abdominal: Soft. Bowel sounds are normal. She exhibits distension  (obese).  Musculoskeletal:     Comments: Limited ROM in spine.    Neurological: She is alert and oriented to person, place, and time. She has normal reflexes.  Skin: Skin is warm and dry.  Psychiatric: She has a normal mood and affect. Her behavior is normal. Judgment and thought content normal.  Nursing note and vitals reviewed.   BP 123/73 (BP Location: Left Arm, Patient Position: Sitting, Cuff Size: Large)   Pulse 87   Temp 98.1 F (36.7 C) (Oral)   Ht 5\' 5"  (1.651 m)   Wt 224 lb 9.6 oz (101.9  kg)   LMP 12/21/2018   BMI 37.38 kg/m  Wt Readings from Last 3 Encounters:  01/01/19 224 lb 9.6 oz (101.9 kg)  11/26/18 226 lb 8 oz (102.7 kg)  11/20/18 226 lb (102.5 kg)     Health Maintenance Due  Topic Date Due  . PAP SMEAR-Modifier  02/10/1993  . INFLUENZA VACCINE  12/01/2018    There are no preventive care reminders to display for this patient.  Lab Results  Component Value Date   TSH 0.961 10/14/2018   Lab Results  Component Value Date   WBC 9.1 11/08/2018   HGB 10.5 (L) 11/08/2018   HCT 35.8 (L) 11/08/2018   MCV 73.8 (L) 11/08/2018   PLT 632 (H) 11/08/2018   Lab Results  Component Value Date   NA 138 10/15/2018   K 4.1 10/15/2018   CO2 24 10/15/2018   GLUCOSE 128 (H) 10/15/2018   BUN 8 10/15/2018   CREATININE 0.76 10/15/2018   BILITOT 0.5 10/23/2018   ALKPHOS 66 10/23/2018   AST 13 10/23/2018   ALT 17 10/23/2018   PROT 6.8 10/23/2018   ALBUMIN 4.6 10/23/2018   CALCIUM 9.2 10/15/2018   ANIONGAP 9 10/15/2018   Lab Results  Component Value Date   CHOL 168 10/23/2018   Lab Results  Component Value Date   HDL 42 10/23/2018   Lab Results  Component Value Date   LDLCALC 80 10/23/2018   Lab Results  Component Value Date   TRIG 228 (H) 10/23/2018   Lab Results  Component Value Date   CHOLHDL 4.0 10/23/2018   Lab Results  Component Value Date   HGBA1C 5.6 10/23/2018    Assessment & Plan:   1. Chronic left-sided low back pain with  left-sided sciatica We will initiate Gabapentin.  - gabapentin (NEURONTIN) 100 MG capsule; Take 1 capsule (100 mg total) by mouth 3 (three) times daily.  Dispense: 90 capsule; Refill: 3 - ketorolac (TORADOL) 30 MG/ML injection 30 mg - ibuprofen (ADVIL) 800 MG tablet; Take 1 tablet (800 mg total) by mouth every 8 (eight) hours as needed for moderate pain.  Dispense: 30 tablet; Refill: 3 - DG Lumbar Spine Complete; Future  2. Numbness and tingling of left lower extremity  3. Follow up She will keep follow up appointment.   Meds ordered this encounter  Medications  . gabapentin (NEURONTIN) 100 MG capsule    Sig: Take 1 capsule (100 mg total) by mouth 3 (three) times daily.    Dispense:  90 capsule    Refill:  3  . ketorolac (TORADOL) 30 MG/ML injection 30 mg  . ibuprofen (ADVIL) 800 MG tablet    Sig: Take 1 tablet (800 mg total) by mouth every 8 (eight) hours as needed for moderate pain.    Dispense:  30 tablet    Refill:  3    Orders Placed This Encounter  Procedures  . DG Lumbar Spine Complete    Referral Orders  No referral(s) requested today    Kathe Becton,  MSN, FNP-BC [address] [place], [place] [address] [phone] fax  Problem List Items Addressed This Visit    None    Visit Diagnoses    Chronic left-sided low back pain with left-sided sciatica    -  Primary   Relevant Medications   gabapentin (NEURONTIN) 100 MG capsule   ketorolac (TORADOL) 30 MG/ML injection 30 mg   ibuprofen (ADVIL) 800 MG  tablet   Other Relevant Orders   DG Lumbar Spine Complete   Numbness and tingling of left lower extremity       Follow up          Meds ordered this encounter  Medications  . gabapentin (NEURONTIN) 100 MG capsule    Sig: Take 1 capsule (100 mg total) by mouth 3 (three) times daily.    Dispense:  90 capsule    Refill:  3  . ketorolac (TORADOL) 30 MG/ML  injection 30 mg  . ibuprofen (ADVIL) 800 MG tablet    Sig: Take 1 tablet (800 mg total) by mouth every 8 (eight) hours as needed for moderate pain.    Dispense:  30 tablet    Refill:  3    Follow-up: No follow-ups on file.    Azzie Glatter, FNP

## 2019-01-01 NOTE — Patient Instructions (Addendum)
Chronic Back Pain When back pain lasts longer than 3 months, it is called chronic back pain. Pain may get worse at certain times (flare-ups). There are things you can do at home to manage your pain. Follow these instructions at home: Activity      Avoid bending and other activities that make pain worse.  When standing: ? Keep your upper back and neck straight. ? Keep your shoulders pulled back. ? Avoid slouching. When sitting:Ketorolac injection What is this medicine? KETOROLAC (kee toe ROLE ak) is a non-steroidal anti-inflammatory drug (NSAID). It is used to treat moderate to severe pain for up to 5 days. It is commonly used after surgery. This medicine should not be used for more than 5 days. This medicine may be used for other purposes; ask your health care provider or pharmacist if you have questions. COMMON BRAND NAME(S): Toradol What should I tell my health care provider before I take this medicine? They need to know if you have any of these conditions: asthma, especially aspirin-sensitive asthma bleeding problems coronary artery bypass graft (CABG) surgery within the past 2 weeks kidney disease stomach bleed, ulcer, or other problem taking aspirin, other NSAID, or probenecid an unusual or allergic reaction to ketorolac, tromethamine, aspirin, other NSAIDs, other medicines, foods, dyes or preservatives pregnant or trying to get pregnant breast-feeding How should I use this medicine? This medicine is for injection into a muscle or into a vein. It is given by a health care professional in a hospital or clinic setting. Talk to your pediatrician regarding the use of this medicine in children. Special care may be needed. Patients over 73 years old may have a stronger reaction and need a smaller dose. Overdosage: If you think you have taken too much of this medicine contact a poison control center or emergency room at once. NOTE: This medicine is only for you. Do not share this  medicine with others. What if I miss a dose? This does not apply. What may interact with this medicine? Do not take this medicine with any of the following medications: aspirin and aspirin-like medicines cidofovir methotrexate NSAIDs, medicines for pain and inflammation, like ibuprofen or naproxen pentoxifylline probenecid This medicine may also interact with the following medications: alcohol alendronate alprazolam carbamazepine diuretics flavocoxid fluoxetine ginkgo lithium medicines for blood pressure like enalapril medicines that affect platelets like pentoxifylline medicines that treat or prevent blood clots like heparin, warfarin muscle relaxants pemetrexed phenytoin thiothixene This list may not describe all possible interactions. Give your health care provider a list of all the medicines, herbs, non-prescription drugs, or dietary supplements you use. Also tell them if you smoke, drink alcohol, or use illegal drugs. Some items may interact with your medicine. What should I watch for while using this medicine? Tell your doctor or healthcare provider if your symptoms do not start to get better or if they get worse. This medicine may cause serious skin reactions. They can happen weeks to months after starting the medicine. Contact your healthcare provider right away if you notice fevers or flu-like symptoms with a rash. The rash may be red or purple and then turn into blisters or peeling of the skin. Or, you might notice a red rash with swelling of the face, lips or lymph nodes in your neck or under your arms. This medicine does not prevent heart attack or stroke. In fact, this medicine may increase the chance of a heart attack or stroke. The chance may increase with longer use of this medicine  and in people who have heart disease. If you take aspirin to prevent heart attack or stroke, talk with your doctor or healthcare provider. Do not take medicines such as ibuprofen and  naproxen with this medicine. Side effects such as stomach upset, nausea, or ulcers may be more likely to occur. Many medicines available without a prescription should not be taken with this medicine. This medicine can cause ulcers and bleeding in the stomach and intestines at any time during treatment. Do not smoke cigarettes or drink alcohol. These increase irritation to your stomach and can make it more susceptible to damage from this medicine. Ulcers and bleeding can happen without warning symptoms and can cause death. This medicine can cause you to bleed more easily. Try to avoid damage to your teeth and gums when you brush or floss your teeth. What side effects may I notice from receiving this medicine? Side effects that you should report to your doctor or health care professional as soon as possible: allergic reactions like skin rash, itching or hives, swelling of the face, lips, or tongue breathing problems high blood pressure nausea, vomiting redness, blistering, peeling or loosening of the skin, including inside the mouth severe stomach pain signs and symptoms of bleeding such as bloody or black, tarry stools; red or dark-brown urine; spitting up blood or brown material that looks like coffee grounds; red spots on the skin; unusual bruising or bleeding from the eye, gums, or nose signs and symptoms of a blood clot changes in vision; chest pain; severe, sudden headache; trouble speaking; sudden numbness or weakness of the face, arm, or leg trouble passing urine or change in the amount of urine unexplained weight gain or swelling unusually weak or tired yellowing of eyes or skin Side effects that usually do not require medical attention (report to your doctor or health care professional if they continue or are bothersome): diarrhea dizziness headache heartburn This list may not describe all possible side effects. Call your doctor for medical advice about side effects. You may report side  effects to FDA at 1-800-FDA-1088. Where should I keep my medicine? This drug is given in a hospital or clinic and will not be stored at home. NOTE: This sheet is a summary. It may not cover all possible information. If you have questions about this medicine, talk to your doctor, pharmacist, or health care provider.  2020 Elsevier/Gold Standard (2018-07-04 15:10:53) Gabapentin capsules or tablets What is this medicine? GABAPENTIN (GA ba pen tin) is used to control seizures in certain types of epilepsy. It is also used to treat certain types of nerve pain. This medicine may be used for other purposes; ask your health care provider or pharmacist if you have questions. COMMON BRAND NAME(S): Active-PAC with Gabapentin, Gabarone, Neurontin What should I tell my health care provider before I take this medicine? They need to know if you have any of these conditions: history of drug abuse or alcohol abuse problem kidney disease lung or breathing disease suicidal thoughts, plans, or attempt; a previous suicide attempt by you or a family member an unusual or allergic reaction to gabapentin, other medicines, foods, dyes, or preservatives pregnant or trying to get pregnant breast-feeding How should I use this medicine? Take this medicine by mouth with a glass of water. Follow the directions on the prescription label. You can take it with or without food. If it upsets your stomach, take it with food. Take your medicine at regular intervals. Do not take it more often than directed.  Do not stop taking except on your doctor's advice. If you are directed to break the 600 or 800 mg tablets in half as part of your dose, the extra half tablet should be used for the next dose. If you have not used the extra half tablet within 28 days, it should be thrown away. A special MedGuide will be given to you by the pharmacist with each prescription and refill. Be sure to read this information carefully each time. Talk to  your pediatrician regarding the use of this medicine in children. While this drug may be prescribed for children as young as 3 years for selected conditions, precautions do apply. Overdosage: If you think you have taken too much of this medicine contact a poison control center or emergency room at once. NOTE: This medicine is only for you. Do not share this medicine with others. What if I miss a dose? If you miss a dose, take it as soon as you can. If it is almost time for your next dose, take only that dose. Do not take double or extra doses. What may interact with this medicine? This medicine may interact with the following medications: alcohol antihistamines for allergy, cough, and cold certain medicines for anxiety or sleep certain medicines for depression like amitriptyline, fluoxetine, sertraline certain medicines for seizures like phenobarbital, primidone certain medicines for stomach problems general anesthetics like halothane, isoflurane, methoxyflurane, propofol local anesthetics like lidocaine, pramoxine, tetracaine medicines that relax muscles for surgery narcotic medicines for pain phenothiazines like chlorpromazine, mesoridazine, prochlorperazine, thioridazine This list may not describe all possible interactions. Give your health care provider a list of all the medicines, herbs, non-prescription drugs, or dietary supplements you use. Also tell them if you smoke, drink alcohol, or use illegal drugs. Some items may interact with your medicine. What should I watch for while using this medicine? Visit your doctor or health care provider for regular checks on your progress. You may want to keep a record at home of how you feel your condition is responding to treatment. You may want to share this information with your doctor or health care provider at each visit. You should contact your doctor or health care provider if your seizures get worse or if you have any new types of seizures. Do  not stop taking this medicine or any of your seizure medicines unless instructed by your doctor or health care provider. Stopping your medicine suddenly can increase your seizures or their severity. This medicine may cause serious skin reactions. They can happen weeks to months after starting the medicine. Contact your health care provider right away if you notice fevers or flu-like symptoms with a rash. The rash may be red or purple and then turn into blisters or peeling of the skin. Or, you might notice a red rash with swelling of the face, lips or lymph nodes in your neck or under your arms. Wear a medical identification bracelet or chain if you are taking this medicine for seizures, and carry a card that lists all your medications. You may get drowsy, dizzy, or have blurred vision. Do not drive, use machinery, or do anything that needs mental alertness until you know how this medicine affects you. To reduce dizzy or fainting spells, do not sit or stand up quickly, especially if you are an older patient. Alcohol can increase drowsiness and dizziness. Avoid alcoholic drinks. Your mouth may get dry. Chewing sugarless gum or sucking hard candy, and drinking plenty of water will help. The use  of this medicine may increase the chance of suicidal thoughts or actions. Pay special attention to how you are responding while on this medicine. Any worsening of mood, or thoughts of suicide or dying should be reported to your health care provider right away. Women who become pregnant while using this medicine may enroll in the [place] Pregnancy Registry by calling [phone]. This registry collects information about the safety of antiepileptic drug use during pregnancy. What side effects may I notice from receiving this medicine? Side effects that you should report to your doctor or health care professional as soon as possible: allergic reactions like skin rash, itching or hives, swelling  of the face, lips, or tongue breathing problems rash, fever, and swollen lymph nodes redness, blistering, peeling or loosening of the skin, including inside the mouth suicidal thoughts, mood changes Side effects that usually do not require medical attention (report to your doctor or health care professional if they continue or are bothersome): dizziness drowsiness headache nausea, vomiting swelling of ankles, feet, hands tiredness This list may not describe all possible side effects. Call your doctor for medical advice about side effects. You may report side effects to FDA at 1-800-FDA-1088. Where should I keep my medicine? Keep out of reach of children. This medicine may cause accidental overdose and death if it taken by other adults, children, or pets. Mix any unused medicine with a substance like cat litter or coffee grounds. Then throw the medicine away in a sealed container like a sealed bag or a coffee can with a lid. Do not use the medicine after the expiration date. Store at room temperature between 15 and 30 degrees C (59 and 86 degrees F). NOTE: This sheet is a summary. It may not cover all possible information. If you have questions about this medicine, talk to your doctor, pharmacist, or health care provider.  2020 Elsevier/Gold Standard (2018-07-20 14:16:43)   ? Keep your back straight. ? Relax your shoulders. Do not round your shoulders or pull them backward.  Do not sit or stand in one place for long periods of time.  Take short rest breaks during the day. Lying down or standing is usually better than sitting. Resting can help relieve pain.  When sitting or lying down for a long time, do some mild activity or stretching. This will help to prevent stiffness and pain.  Get regular exercise. Ask your doctor what activities are safe for you.  Do not lift anything that is heavier than 10 lb (4.5 kg). To prevent injury when you lift things: ? Bend your knees. ? Keep the  weight close to your body. ? Avoid twisting. Managing pain  If told, put ice on the painful area. Your doctor may tell you to use ice for 24-48 hours after a flare-up starts. ? Put ice in a plastic bag. ? Place a towel between your skin and the bag. ? Leave the ice on for 20 minutes, 2-3 times a day.  If told, put heat on the painful area as often as told by your doctor. Use the heat source that your doctor recommends, such as a moist heat pack or a heating pad. ? Place a towel between your skin and the heat source. ? Leave the heat on for 20-30 minutes. ? Remove the heat if your skin turns bright red. This is especially important if you are unable to feel pain, heat, or cold. You may have a greater risk of getting burned.  Soak in  a warm bath. This can help relieve pain.  Take over-the-counter and prescription medicines only as told by your doctor. General instructions  Sleep on a firm mattress. Try lying on your side with your knees slightly bent. If you lie on your back, put a pillow under your knees.  Keep all follow-up visits as told by your doctor. This is important. Contact a doctor if:  You have pain that does not get better with rest or medicine. Get help right away if:  One or both of your arms or legs feel weak.  One or both of your arms or legs lose feeling (numbness).  You have trouble controlling when you poop (bowel movement) or pee (urinate).  You feel sick to your stomach (nauseous).  You throw up (vomit).  You have belly (abdominal) pain.  You have shortness of breath.  You pass out (faint). Summary  When back pain lasts longer than 3 months, it is called chronic back pain.  Pain may get worse at certain times (flare-ups).  Use ice and heat as told by your doctor. Your doctor may tell you to use ice after flare-ups. This information is not intended to replace advice given to you by your health care provider. Make sure you discuss any questions you  have with your health care provider. Document Released: 10/05/2007 Document Revised: 08/09/2018 Document Reviewed: 12/01/2016 Elsevier Patient Education  [employer]. Ibuprofen tablets and capsules What is this medicine? IBUPROFEN (eye BYOO proe fen) is a non-steroidal anti-inflammatory drug (NSAID). It is used for dental pain, fever, headaches or migraines, osteoarthritis, rheumatoid arthritis, or painful monthly periods. It can also relieve minor aches and pains caused by a cold, flu, or sore throat. This medicine may be used for other purposes; ask your health care provider or pharmacist if you have questions. COMMON BRAND NAME(S): Advil, Advil Junior Strength, Advil Migraine, Genpril, Ibren, IBU, Midol, Midol Cramps and Body Aches, Motrin, Motrin IB, Motrin Junior Strength, Motrin Migraine Pain, Samson-8, Toxicology Saliva Collection What should I tell my health care provider before I take this medicine? They need to know if you have any of these conditions:  cigarette smoker  coronary artery bypass graft (CABG) surgery within the past 2 weeks  drink more than 3 alcohol-containing drinks a day  heart disease  high blood pressure  history of stomach bleeding  kidney disease  liver disease  lung or breathing disease, like asthma  an unusual or allergic reaction to ibuprofen, aspirin, other NSAIDs, other medicines, foods, dyes, or preservatives  pregnant or trying to get pregnant  breast-feeding How should I use this medicine? Take this medicine by mouth with a glass of water. Follow the directions on the prescription label. Take this medicine with food if your stomach gets upset. Try to not lie down for at least 10 minutes after you take the medicine. Take your medicine at regular intervals. Do not take your medicine more often than directed. A special MedGuide will be given to you by the pharmacist with each prescription and refill. Be sure to read this information  carefully each time. Talk to your pediatrician regarding the use of this medicine in children. Special care may be needed. Overdosage: If you think you have taken too much of this medicine contact a poison control center or emergency room at once. NOTE: This medicine is only for you. Do not share this medicine with others. What if I miss a dose? If you miss a dose, take it as soon  as you can. If it is almost time for your next dose, take only that dose. Do not take double or extra doses. What may interact with this medicine? Do not take this medicine with any of the following medications:  cidofovir  ketorolac  methotrexate  pemetrexed This medicine may also interact with the following medications:  alcohol  aspirin  diuretics  lithium  other drugs for inflammation like prednisone  warfarin This list may not describe all possible interactions. Give your health care provider a list of all the medicines, herbs, non-prescription drugs, or dietary supplements you use. Also tell them if you smoke, drink alcohol, or use illegal drugs. Some items may interact with your medicine. What should I watch for while using this medicine? Tell your doctor or healthcare provider if your symptoms do not start to get better or if they get worse. This medicine may cause serious skin reactions. They can happen weeks to months after starting the medicine. Contact your healthcare provider right away if you notice fevers or flu-like symptoms with a rash. The rash may be red or purple and then turn into blisters or peeling of the skin. Or, you might notice a red rash with swelling of the face, lips or lymph nodes in your neck or under your arms. This medicine does not prevent heart attack or stroke. In fact, this medicine may increase the chance of a heart attack or stroke. The chance may increase with longer use of this medicine and in people who have heart disease. If you take aspirin to prevent heart  attack or stroke, talk with your doctor or healthcare provider. Do not take other medicines that contain aspirin, ibuprofen, or naproxen with this medicine. Side effects such as stomach upset, nausea, or ulcers may be more likely to occur. Many medicines available without a prescription should not be taken with this medicine. This medicine can cause ulcers and bleeding in the stomach and intestines at any time during treatment. Ulcers and bleeding can happen without warning symptoms and can cause death. To reduce your risk, do not smoke cigarettes or drink alcohol while you are taking this medicine. You may get drowsy or dizzy. Do not drive, use machinery, or do anything that needs mental alertness until you know how this medicine affects you. Do not stand or sit up quickly, especially if you are an older patient. This reduces the risk of dizzy or fainting spells. This medicine can cause you to bleed more easily. Try to avoid damage to your teeth and gums when you brush or floss your teeth. This medicine may be used to treat migraines. If you take migraine medicines for 10 or more days a month, your migraines may get worse. Keep a diary of headache days and medicine use. Contact your healthcare provider if your migraine attacks occur more frequently. What side effects may I notice from receiving this medicine? Side effects that you should report to your doctor or health care professional as soon as possible:  allergic reactions like skin rash, itching or hives, swelling of the face, lips, or tongue  redness, blistering, peeling or loosening of the skin, including inside the mouth  severe stomach pain  signs and symptoms of bleeding such as bloody or black, tarry stools; red or dark-brown urine; spitting up blood or brown material that looks like coffee grounds; red spots on the skin; unusual bruising or bleeding from the eye, gums, or nose  signs and symptoms of a blood clot such as  changes in  vision; chest pain; severe, sudden headache; trouble speaking; sudden numbness or weakness of the face, arm, or leg  unexplained weight gain or swelling  unusually weak or tired  yellowing of eyes or skin Side effects that usually do not require medical attention (report to your doctor or health care professional if they continue or are bothersome):  bruising  diarrhea  dizziness, drowsiness  headache  nausea, vomiting This list may not describe all possible side effects. Call your doctor for medical advice about side effects. You may report side effects to FDA at 1-800-FDA-1088. Where should I keep my medicine? Keep out of the reach of children. Store at room temperature between 15 and 30 degrees C (59 and 86 degrees F). Keep container tightly closed. Throw away any unused medicine after the expiration date. NOTE: This sheet is a summary. It may not cover all possible information. If you have questions about this medicine, talk to your doctor, pharmacist, or health care provider.  2020 Elsevier/Gold Standard (2018-07-04 14:11:00)

## 2019-01-04 ENCOUNTER — Telehealth: Payer: Self-pay

## 2019-01-04 ENCOUNTER — Other Ambulatory Visit: Payer: Self-pay | Admitting: Family Medicine

## 2019-01-04 DIAGNOSIS — I700 Atherosclerosis of aorta: Secondary | ICD-10-CM

## 2019-01-04 NOTE — Progress Notes (Signed)
We will refer patient to Vascular Surgery for Aortic Atherosclerosis today.

## 2019-01-04 NOTE — Telephone Encounter (Signed)
Pt called and wanted to know the results of her x-ray of her back that she had due on 01/01/19, she stated that she is still having a lot of pain and not able bare weight on her leg. She said that the shot help for a few hours then pain returned. She wants next course of treatment because her job is giving her a hard time.

## 2019-01-08 ENCOUNTER — Telehealth: Payer: Self-pay

## 2019-01-08 NOTE — Telephone Encounter (Signed)
Pt called checking the status of her referral .

## 2019-01-10 ENCOUNTER — Other Ambulatory Visit (HOSPITAL_COMMUNITY): Payer: Self-pay | Admitting: Vascular Surgery

## 2019-01-10 ENCOUNTER — Ambulatory Visit (HOSPITAL_COMMUNITY)
Admission: RE | Admit: 2019-01-10 | Discharge: 2019-01-10 | Disposition: A | Discharge status: Home / Self Care | Payer: 59 | Source: Ambulatory Visit | Attending: Family | Admitting: Family

## 2019-01-10 ENCOUNTER — Other Ambulatory Visit: Payer: Self-pay

## 2019-01-10 ENCOUNTER — Ambulatory Visit (INDEPENDENT_AMBULATORY_CARE_PROVIDER_SITE_OTHER): Payer: 59 | Admitting: Vascular Surgery

## 2019-01-10 ENCOUNTER — Encounter: Payer: Self-pay | Admitting: Vascular Surgery

## 2019-01-10 VITALS — BP 154/92 | HR 83 | Resp 20 | Ht 65.0 in | Wt 229.1 lb

## 2019-01-10 DIAGNOSIS — I739 Peripheral vascular disease, unspecified: Secondary | ICD-10-CM | POA: Diagnosis not present

## 2019-01-10 DIAGNOSIS — I700 Atherosclerosis of aorta: Secondary | ICD-10-CM | POA: Insufficient documentation

## 2019-01-10 MED ORDER — ASPIRIN EC 81 MG PO TBEC: 81.0000 mg | DELAYED_RELEASE_TABLET | Freq: Every day | ORAL | 2 refills | Status: DC

## 2019-01-10 NOTE — Progress Notes (Signed)
Referring Physician: Kathe Becton NP  Patient name: Erin Zamora MRN: [id number] DOB: January 04, 1971 Sex: female  REASON FOR CONSULT: Left leg pain  HPI: Erin Zamora is a 48 y.o. female, with a several week history of pain in her left anterior thigh with some numbness.  She also complains of a near cramping sensation that occurs in her left calf after walking a few blocks.  She does not have any numbness or tingling in her foot.  She does have some occasional left ankle swelling.  She also complains of some occasional right arm swelling.  She is a former smoker quit in May 2020.  She does not have any nonhealing wounds.  Other medical problems include hyperlipidemia and thrombocytosis.  These have both been stable.  She is not currently on aspirin.  She initially presented to her PCP and as part of her evaluation for her leg pain a lumbar spine x-ray was obtained which showed calcification of the aorta.  Past Medical History:  Diagnosis Date  . Allergy    seasonal  . Anemia   . Anxiety   . CAP (community acquired pneumonia) 10/2018  . Cough 10/2018  . Hyperlipidemia 10/2018  . Insomnia 10/2018  . Microcytic anemia   . Thrombocytosis ([place])   . Vitamin D deficiency 10/2018   Past Surgical History:  Procedure Laterality Date  . APPENDECTOMY  1991  . CHOLECYSTECTOMY  2003  . TUBAL LIGATION  2003    Family History  Problem Relation Age of Onset  . Alcohol abuse Mother   . COPD Mother   . Hypertension Mother   . Alcohol abuse Father   . Diabetes Father   . Drug abuse Father   . Heart disease Father   . Hypertension Father   . Kidney disease Father   . Arthritis Maternal Grandmother   . Arthritis Maternal Grandfather   . Cancer Paternal Grandmother   . Diabetes Paternal Grandmother   . Heart disease Paternal Grandmother   . Diabetes Paternal Grandfather   . Heart disease Paternal Grandfather   . Kidney disease Paternal Grandfather   . Stroke Paternal  Grandfather     SOCIAL HISTORY: Social History   Socioeconomic History  . Marital status: Single    Spouse name: Not on file  . Number of children: Not on file  . Years of education: Not on file  . Highest education level: Not on file  Occupational History  . Not on file  Social Needs  . Financial resource strain: Not on file  . Food insecurity    Worry: Not on file    Inability: Not on file  . Transportation needs    Medical: Not on file    Non-medical: Not on file  Tobacco Use  . Smoking status: Former Smoker    Packs/day: 0.25    Types: Cigarettes    Quit date: 10/01/2018    Years since quitting: 0.2  . Smokeless tobacco: Never Used  . Tobacco comment: 5-7 ciggs per day  Substance and Sexual Activity  . Alcohol use: Yes    Alcohol/week: 0.0 standard drinks  . Drug use: No  . Sexual activity: Yes    Partners: Male    Birth control/protection: None  Lifestyle  . Physical activity    Days per week: Not on file    Minutes per session: Not on file  . Stress: Not on file  Relationships  . Social Herbalist on phone:  Not on file    Gets together: Not on file    Attends religious service: Not on file    Active member of club or organization: Not on file    Attends meetings of clubs or organizations: Not on file    Relationship status: Not on file  . Intimate partner violence    Fear of current or ex partner: Not on file    Emotionally abused: Not on file    Physically abused: Not on file    Forced sexual activity: Not on file  Other Topics Concern  . Not on file  Social History Narrative   47 year old and 3 y/o daughters   14 year old granddaughter - takes care of her in the afternoon after school   Significant other - 15 years   Work: Web designer    Allergies  Allergen Reactions  . Codeine Itching and Nausea And Vomiting    Current Outpatient Medications  Medication Sig Dispense Refill  . busPIRone (BUSPAR) 10 MG tablet Take 1  tablet (10 mg total) by mouth 2 (two) times daily. 60 tablet 2  . gabapentin (NEURONTIN) 100 MG capsule Take 1 capsule (100 mg total) by mouth 3 (three) times daily. (Patient taking differently: Take 200 mg by mouth 3 (three) times daily. ) 90 capsule 3  . Glycopyrrolate-Formoterol (BEVESPI AEROSPHERE) 9-4.8 MCG/ACT AERO Inhale 2 puffs into the lungs 2 (two) times a day. 10.7 g 5  . ibuprofen (ADVIL) 800 MG tablet Take 1 tablet (800 mg total) by mouth every 8 (eight) hours as needed for moderate pain. 30 tablet 3  . UNABLE TO FIND Iron infusions every other month    . Vitamin D, Ergocalciferol, (DRISDOL) 1.25 MG (50000 UT) CAPS capsule Take 1 capsule (50,000 Units total) by mouth every 7 (seven) days. 5 capsule 6   No current facility-administered medications for this visit.     ROS:   General:  No weight loss, Fever, chills  HEENT: No recent headaches, no nasal bleeding, no visual changes, no sore throat  Neurologic: No dizziness, blackouts, seizures. No recent symptoms of stroke or mini- stroke. No recent episodes of slurred speech, or temporary blindness.  Cardiac: No recent episodes of chest pain/pressure, no shortness of breath at rest.  No shortness of breath with exertion.  Denies history of atrial fibrillation or irregular heartbeat  Vascular: No history of rest pain in feet.  No history of claudication.  No history of non-healing ulcer, No history of DVT   Pulmonary: No home oxygen, no productive cough, no hemoptysis,  No asthma or wheezing  Musculoskeletal:  [ ]  Arthritis, [ ]  Low back pain,  [ ]  Joint pain  Hematologic:No history of hypercoagulable state.  No history of easy bleeding.  No history of anemia  Gastrointestinal: No hematochezia or melena,  No gastroesophageal reflux, no trouble swallowing  Urinary: [ ]  chronic Kidney disease, [ ]  on HD - [ ]  MWF or [ ]  TTHS, [ ]  Burning with urination, [ ]  Frequent urination, [ ]  Difficulty urinating;   Skin: No rashes   Psychological: No history of anxiety,  No history of depression   Physical Examination  Vitals:   01/10/19 1333  BP: (!) 154/92  Pulse: 83  Resp: 20  SpO2: 96%  Weight: 229 lb 1.6 oz (103.9 kg)  Height: 5\' 5"  (1.651 m)    Body mass index is 38.12 kg/m.  General:  Alert and oriented, no acute distress HEENT: Normal Neck: No  JVD Pulmonary: Clear to auscultation bilaterally Cardiac: Regular Rate and Rhythm  Abdomen: Soft, non-tender, non-distended, no mass Skin: No rash Extremity Pulses:  2+ radial, brachial, femoral, dorsalis pedis, posterior tibial pulses bilaterally Musculoskeletal: No deformity, trace edema right upper extremity no palpable axillary adenopathy bilaterally  Neurologic: Upper and lower extremity motor 5/5 and symmetric  DATA:  Patient had bilateral ABIs performed today which were greater than 1 triphasic and normal bilaterally  ASSESSMENT: #1 right arm edema.  Chronic over the last several years.  No evidence of axillary adenopathy.  Patient may have some component of either lymphedema or venous outflow obstruction in the right arm.  She has some occasional numbness and tingling.  She could have some mild thoracic outlet syndrome.  This is not really bothersome to her.  I do not know that it necessarily needs any further work-up at this point.  2.  Left leg pain with normal ABIs.  The patient has pain in her left calf with walking.  She could have a component of aortoiliac occlusive disease that would not be detected on the ABIs.  Since she cannot walk very far without experiencing this pain I do not believe that she would be a candidate for ABIs with exercise.  Instead we will obtain aortoiliac duplex to fully rule out any aortoiliac occlusive disease.  If this is negative I do not believe she needs any further evaluation from a vascular standpoint.  I congratulated her on quitting smoking.  She could possibly benefit from taking an aspirin since she does have some  underlying evidence of atherosclerosis probably due to her prior smoking history and elevated cholesterol.  I will call her with the results of her aortoiliac duplex after this is been performed.  Try to get this within the next 1 to 2 weeks.   PLAN: Aortoiliac duplex scan and follow-up phone call with results   Ruta Hinds, MD [facility] Office: [phone] Pager: [phone]

## 2019-01-22 ENCOUNTER — Other Ambulatory Visit: Payer: Self-pay

## 2019-01-22 DIAGNOSIS — I739 Peripheral vascular disease, unspecified: Secondary | ICD-10-CM

## 2019-01-23 ENCOUNTER — Telehealth (HOSPITAL_COMMUNITY): Payer: Self-pay

## 2019-01-23 NOTE — Telephone Encounter (Signed)
Spoke with patient re: tomorrow's appointment.  Two people at her office recently tested positive for Covid-19 and although she is asymptomatic she is unclear is she should reschedule tomorrow's appointment.    I am going to forward this information to our triage nurse who will contact the patient.

## 2019-01-24 ENCOUNTER — Inpatient Hospital Stay (HOSPITAL_COMMUNITY): Admission: RE | Admit: 2019-01-24 | Payer: 59 | Source: Ambulatory Visit

## 2019-01-24 ENCOUNTER — Ambulatory Visit: Payer: 59 | Admitting: Vascular Surgery

## 2019-02-24 NOTE — Progress Notes (Signed)
[facility]  [phone] [phone]  HEMATOLOGY-ONCOLOGY TELEMEDICINE VISIT PROGRESS NOTE  ID: [name] OB: 05/07/1970  MR#: [id number]  [id number]  Patient Care Team: Azzie Glatter, FNP as PCP - General (Family Medicine)    CHIEF COMPLAINT: Thrombocytosis, iron deficiency anemia.  INTERVAL HISTORY: Patient returns to clinic today for repeat laboratory work, further evaluation, consideration of additional IV Venofer.  She did not notice any change in her chronic weakness and fatigue after receiving iron infusions.  She also has vague intermittent abdominal pain. She has no neurologic complaints.  She denies any recent fevers or illnesses.  She has a good appetite and denies weight loss.  She has no chest pain, shortness of breath, cough, or hemoptysis.  She denies any nausea, vomiting, constipation, or diarrhea.  She denies any melena or hematochezia.  She has no urinary complaints.  Patient offers no further specific complaints today.  REVIEW OF SYSTEMS:   Review of Systems  Constitutional: Positive for malaise/fatigue. Negative for fever and weight loss.  Respiratory: Negative.  Negative for cough, hemoptysis and shortness of breath.   Cardiovascular: Negative.  Negative for chest pain and leg swelling.  Gastrointestinal: Negative.  Negative for abdominal pain.  Genitourinary: Negative.  Negative for dysuria.  Musculoskeletal: Negative.  Negative for back pain.  Skin: Negative.  Negative for rash.  Neurological: Positive for weakness. Negative for dizziness, focal weakness and headaches.  Psychiatric/Behavioral: Negative.  The patient is not nervous/anxious.     As per HPI. Otherwise, a complete review of systems is negative.  PAST MEDICAL HISTORY: Past Medical History:  Diagnosis Date  . Allergy    seasonal  . Anemia   . Anxiety   . CAP (community acquired pneumonia) 10/2018  . Cough 10/2018  . Hyperlipidemia  10/2018  . Insomnia 10/2018  . Microcytic anemia   . Thrombocytosis ([place])   . Vitamin D deficiency 10/2018    PAST SURGICAL HISTORY: Past Surgical History:  Procedure Laterality Date  . APPENDECTOMY  1991  . CHOLECYSTECTOMY  2003  . TUBAL LIGATION  2003    FAMILY HISTORY: Family History  Problem Relation Age of Onset  . Alcohol abuse Mother   . COPD Mother   . Hypertension Mother   . Alcohol abuse Father   . Diabetes Father   . Drug abuse Father   . Heart disease Father   . Hypertension Father   . Kidney disease Father   . Arthritis Maternal Grandmother   . Arthritis Maternal Grandfather   . Cancer Paternal Grandmother   . Diabetes Paternal Grandmother   . Heart disease Paternal Grandmother   . Diabetes Paternal Grandfather   . Heart disease Paternal Grandfather   . Kidney disease Paternal Grandfather   . Stroke Paternal Grandfather     ADVANCED DIRECTIVES (Y/N):  N  HEALTH MAINTENANCE: Social History   Tobacco Use  . Smoking status: Former Smoker    Packs/day: 0.25    Types: Cigarettes    Quit date: 10/01/2018    Years since quitting: 0.4  . Smokeless tobacco: Never Used  . Tobacco comment: 5-7 ciggs per day  Substance Use Topics  . Alcohol use: Yes    Alcohol/week: 0.0 standard drinks  . Drug use: No     Colonoscopy:  PAP:  Bone density:  Lipid panel:  Allergies  Allergen Reactions  . Codeine Itching and Nausea And Vomiting    Current Outpatient Medications  Medication Sig Dispense Refill  . aspirin  EC 81 MG tablet Take 1 tablet (81 mg total) by mouth daily. 150 tablet 2  . gabapentin (NEURONTIN) 100 MG capsule Take 1 capsule (100 mg total) by mouth 3 (three) times daily. (Patient taking differently: Take 200 mg by mouth 3 (three) times daily. ) 90 capsule 3  . Glycopyrrolate-Formoterol (BEVESPI AEROSPHERE) 9-4.8 MCG/ACT AERO Inhale 2 puffs into the lungs 2 (two) times a day. 10.7 g 5  . ibuprofen (ADVIL) 800 MG tablet Take 1 tablet (800 mg  total) by mouth every 8 (eight) hours as needed for moderate pain. 30 tablet 3  . UNABLE TO FIND Iron infusions every other month    . Vitamin D, Ergocalciferol, (DRISDOL) 1.25 MG (50000 UT) CAPS capsule Take 1 capsule (50,000 Units total) by mouth every 7 (seven) days. 5 capsule 6  . busPIRone (BUSPAR) 10 MG tablet Take 1 tablet (10 mg total) by mouth 2 (two) times daily. (Patient not taking: Reported on 02/25/2019) 60 tablet 2   No current facility-administered medications for this visit.     OBJECTIVE: Vitals:   02/26/19 1325  BP: (!) 142/90  Pulse: 77  Resp: 18  Temp: 98.9 F (37.2 C)  SpO2: 98%     Body mass index is 37.56 kg/m.    ECOG FS:0 - Asymptomatic  General: Well-developed, well-nourished, no acute distress. Eyes: Pink conjunctiva, anicteric sclera. HEENT: Normocephalic, moist mucous membranes. Lungs: Clear to auscultation bilaterally. Heart: Regular rate and rhythm. No rubs, murmurs, or gallops. Abdomen: Soft, nontender, nondistended. No organomegaly noted, normoactive bowel sounds. Musculoskeletal: No edema, cyanosis, or clubbing. Neuro: Alert, answering all questions appropriately. Cranial nerves grossly intact. Skin: No rashes or petechiae noted.  Subcentimeter subcutaneous nodule palpated in left upper quadrant abdomen, likely lipoma. Psych: Normal affect.  LAB RESULTS:  Lab Results  Component Value Date   NA 138 10/15/2018   K 4.1 10/15/2018   CL 105 10/15/2018   CO2 24 10/15/2018   GLUCOSE 128 (H) 10/15/2018   BUN 8 10/15/2018   CREATININE 0.76 10/15/2018   CALCIUM 9.2 10/15/2018   PROT 6.8 10/23/2018   ALBUMIN 4.6 10/23/2018   AST 13 10/23/2018   ALT 17 10/23/2018   ALKPHOS 66 10/23/2018   BILITOT 0.5 10/23/2018   GFRNONAA >60 10/15/2018   GFRAA >60 10/15/2018    Lab Results  Component Value Date   WBC 10.2 02/26/2019   NEUTROABS 6.5 02/26/2019   HGB 12.0 02/26/2019   HCT 37.5 02/26/2019   MCV 82.4 02/26/2019   PLT 595 (H) 02/26/2019    Lab Results  Component Value Date   IRON 20 (L) 02/26/2019   TIBC 489 (H) 02/26/2019   IRONPCTSAT 4 (L) 02/26/2019   Lab Results  Component Value Date   FERRITIN 5 (L) 02/26/2019     STUDIES: No results found.  ASSESSMENT: Thrombocytosis, iron deficiency anemia.  PLAN:  1.  Iron deficiency anemia: Patient's hemoglobin has significantly improved and now is within normal limits.  Although, she continues to have decreased iron stores.  Previously, had a trial of oral iron supplementation but did not improve her blood counts.  Patient does not require additional IV Venofer today, but will likely require treatment at her next clinic visit.  Return to clinic in 3 months with repeat laboratory, further evaluation, and continuation of treatment.   2. Thrombocytosis: Secondary to iron deficiency.  Patient's platelet count has improved.  Previously, JAK-2 mutation was negative.  IV Feraheme as above.   3.  Abdominal pain: Unlikely related  to possible lipoma as above.  Symptoms seem secondary to reflux and recommended OTC treatment.  If her symptoms become worse, have recommended patient proceed to urgent care or the emergency room.   Patient expressed understanding that she can return to clinic at any time if she has any questions, concerns, or complaints.   Lloyd Huger, MD 02/26/2019 4:20 PM

## 2019-02-25 NOTE — Progress Notes (Signed)
Patient is coming in for follow up she mentions left side rib pain.

## 2019-02-26 ENCOUNTER — Other Ambulatory Visit: Payer: Self-pay

## 2019-02-26 ENCOUNTER — Inpatient Hospital Stay: Payer: 59 | Attending: Oncology

## 2019-02-26 ENCOUNTER — Inpatient Hospital Stay: Payer: 59

## 2019-02-26 ENCOUNTER — Inpatient Hospital Stay (HOSPITAL_BASED_OUTPATIENT_CLINIC_OR_DEPARTMENT_OTHER): Payer: 59 | Admitting: Oncology

## 2019-02-26 VITALS — BP 142/90 | HR 77 | Temp 98.9°F | Resp 18 | Wt 225.7 lb

## 2019-02-26 DIAGNOSIS — E785 Hyperlipidemia, unspecified: Secondary | ICD-10-CM | POA: Diagnosis not present

## 2019-02-26 DIAGNOSIS — Z87891 Personal history of nicotine dependence: Secondary | ICD-10-CM | POA: Insufficient documentation

## 2019-02-26 DIAGNOSIS — Z7982 Long term (current) use of aspirin: Secondary | ICD-10-CM | POA: Insufficient documentation

## 2019-02-26 DIAGNOSIS — R109 Unspecified abdominal pain: Secondary | ICD-10-CM | POA: Diagnosis not present

## 2019-02-26 DIAGNOSIS — D509 Iron deficiency anemia, unspecified: Secondary | ICD-10-CM | POA: Insufficient documentation

## 2019-02-26 DIAGNOSIS — Z79899 Other long term (current) drug therapy: Secondary | ICD-10-CM | POA: Insufficient documentation

## 2019-02-26 DIAGNOSIS — R7989 Other specified abnormal findings of blood chemistry: Secondary | ICD-10-CM | POA: Insufficient documentation

## 2019-02-26 LAB — CBC WITH DIFFERENTIAL/PLATELET
Abs Immature Granulocytes: 0.07 10*3/uL (ref 0.00–0.07)
Basophils Absolute: 0.1 10*3/uL (ref 0.0–0.1)
Basophils Relative: 1 %
Eosinophils Absolute: 0.3 10*3/uL (ref 0.0–0.5)
Eosinophils Relative: 3 %
HCT: 37.5 % (ref 36.0–46.0)
Hemoglobin: 12 g/dL (ref 12.0–15.0)
Immature Granulocytes: 1 %
Lymphocytes Relative: 23 %
Lymphs Abs: 2.4 10*3/uL (ref 0.7–4.0)
MCH: 26.4 pg (ref 26.0–34.0)
MCHC: 32 g/dL (ref 30.0–36.0)
MCV: 82.4 fL (ref 80.0–100.0)
Monocytes Absolute: 0.9 10*3/uL (ref 0.1–1.0)
Monocytes Relative: 9 %
Neutro Abs: 6.5 10*3/uL (ref 1.7–7.7)
Neutrophils Relative %: 63 %
Platelets: 595 10*3/uL — ABNORMAL HIGH (ref 150–400)
RBC: 4.55 MIL/uL (ref 3.87–5.11)
RDW: 13.8 % (ref 11.5–15.5)
WBC: 10.2 10*3/uL (ref 4.0–10.5)
nRBC: 0 % (ref 0.0–0.2)

## 2019-02-26 LAB — IRON AND TIBC
Iron: 20 ug/dL — ABNORMAL LOW (ref 28–170)
Saturation Ratios: 4 % — ABNORMAL LOW (ref 10.4–31.8)
TIBC: 489 ug/dL — ABNORMAL HIGH (ref 250–450)
UIBC: 469 ug/dL

## 2019-02-26 LAB — FERRITIN: Ferritin: 5 ng/mL — ABNORMAL LOW (ref 11–307)

## 2019-02-26 NOTE — Progress Notes (Signed)
Pt in for follow up, reports CMA called yesterday, verbalized no changes in medications. Pt reports not much change in energy level since last infusion. Pt also reports has left lower rib pain rates 4, states has felt a "knot" in the area as well.

## 2019-03-14 ENCOUNTER — Encounter: Payer: Self-pay | Admitting: Vascular Surgery

## 2019-03-14 ENCOUNTER — Other Ambulatory Visit: Payer: Self-pay

## 2019-03-14 ENCOUNTER — Ambulatory Visit (INDEPENDENT_AMBULATORY_CARE_PROVIDER_SITE_OTHER): Payer: 59 | Admitting: Vascular Surgery

## 2019-03-14 ENCOUNTER — Ambulatory Visit (HOSPITAL_COMMUNITY)
Admission: RE | Admit: 2019-03-14 | Discharge: 2019-03-14 | Disposition: A | Discharge status: Home / Self Care | Payer: 59 | Source: Ambulatory Visit | Attending: Vascular Surgery | Admitting: Vascular Surgery

## 2019-03-14 VITALS — BP 150/82 | HR 74 | Temp 97.6°F | Resp 20 | Ht 65.0 in | Wt 228.0 lb

## 2019-03-14 DIAGNOSIS — I739 Peripheral vascular disease, unspecified: Secondary | ICD-10-CM

## 2019-03-14 DIAGNOSIS — M79605 Pain in left leg: Secondary | ICD-10-CM | POA: Diagnosis not present

## 2019-03-14 NOTE — Progress Notes (Signed)
Patient is a 48 year old female who returns for follow-up today.  She has been complaining of some pain in her anterior thigh and numbness.  She states this is a little bit improved from when I last saw her in September.  She continues to refrain from smoking.  We had discussed taking aspirin but she has not started this yet.  Physical exam:  Vitals:   03/14/19 0918  BP: (!) 150/82  Pulse: 74  Resp: 20  Temp: 97.6 F (36.4 C)  SpO2: 95%  Weight: 228 lb (103.4 kg)  Height: 5\' 5"  (1.651 m)    General well-appearing white female no acute distress  Skin: No open ulcer or rash on feet  Data: Patient had an aortoiliac duplex today which showed no significant flow-limiting stenosis in her aortoiliac segments bilaterally.  I reviewed and interpreted the study.  Assessment: Left leg pain no obvious vascular etiology slightly improved now with normal ABIs and aortoiliac duplex  Plan: Patient will follow up on an as-needed basis.  I discussed with her starting to take 81 mg of aspirin once daily.  She could certainly stop this if she has any GI problems related to that.  She will continue to refrain from smoking.  Ruta Hinds, MD [facility] Office: [phone]

## 2019-05-24 ENCOUNTER — Encounter: Payer: Self-pay | Admitting: Family Medicine

## 2019-05-24 ENCOUNTER — Other Ambulatory Visit: Payer: Self-pay

## 2019-05-24 ENCOUNTER — Ambulatory Visit (INDEPENDENT_AMBULATORY_CARE_PROVIDER_SITE_OTHER): Payer: 59 | Admitting: Family Medicine

## 2019-05-24 VITALS — BP 131/79 | HR 75 | Temp 97.9°F | Ht 65.0 in | Wt 231.2 lb

## 2019-05-24 DIAGNOSIS — R8290 Unspecified abnormal findings in urine: Secondary | ICD-10-CM

## 2019-05-24 DIAGNOSIS — Z0000 Encounter for general adult medical examination without abnormal findings: Secondary | ICD-10-CM

## 2019-05-24 DIAGNOSIS — F22 Delusional disorders: Secondary | ICD-10-CM

## 2019-05-24 DIAGNOSIS — D509 Iron deficiency anemia, unspecified: Secondary | ICD-10-CM

## 2019-05-24 DIAGNOSIS — Z09 Encounter for follow-up examination after completed treatment for conditions other than malignant neoplasm: Secondary | ICD-10-CM

## 2019-05-24 DIAGNOSIS — D473 Essential (hemorrhagic) thrombocythemia: Secondary | ICD-10-CM

## 2019-05-24 DIAGNOSIS — F419 Anxiety disorder, unspecified: Secondary | ICD-10-CM | POA: Diagnosis not present

## 2019-05-24 DIAGNOSIS — D75839 Thrombocytosis, unspecified: Secondary | ICD-10-CM

## 2019-05-24 LAB — POCT URINALYSIS DIPSTICK
Bilirubin, UA: NEGATIVE
Glucose, UA: NEGATIVE
Ketones, UA: NEGATIVE
Nitrite, UA: NEGATIVE
Protein, UA: NEGATIVE
Spec Grav, UA: 1.025 (ref 1.010–1.025)
Urobilinogen, UA: 1 E.U./dL
pH, UA: 6 (ref 5.0–8.0)

## 2019-05-24 NOTE — Progress Notes (Signed)
Patient [facility]   Established Patient Office Visit  Subjective:  Patient ID: Erin Zamora, female    DOB: 1970-06-21  Age: 49 y.o. MRN: [id number]  CC:  Chief Complaint  Patient presents with  . Follow-up    6 mth follow up   . Referral    [facility]    HPI Erin Zamora is a 49 year old female who presents for Follow Up today.  Past Medical History:  Diagnosis Date  . Allergy    seasonal  . Anemia   . Anxiety   . CAP (community acquired pneumonia) 10/2018  . Cough 10/2018  . Hyperlipidemia 10/2018  . Insomnia 10/2018  . Microcytic anemia   . Thrombocytosis ([place])   . Vitamin D deficiency 10/2018   Current Status: Since her last office visit, she is doing well with no complaints. She continues to follow up with Dr. Grayland Ormond, Oncologist for Iron infusions for Iron deficiency anemia. She has c/o increased anxiety and paranoia lately. She was referred to [facility] 10/2018 and would like to be referred back. She states that she did not like the side effects of Buspar, so she discontinued taking med. She denies suicidal ideations, homicidal ideations, or auditory hallucinations. She denies fevers, chills, fatigue, recent infections, weight loss, and night sweats. She has not had any headaches, visual changes, dizziness, and falls. No chest pain, heart palpitations, cough and shortness of breath reported. No reports of GI problems such as nausea, vomiting, diarrhea, and constipation. She has no reports of blood in stools, dysuria and hematuria. She denies pain today.   Past Surgical History:  Procedure Laterality Date  . APPENDECTOMY  1991  . CHOLECYSTECTOMY  2003  . TUBAL LIGATION  2003    Family History  Problem Relation Age of Onset  . Alcohol abuse Mother   . COPD Mother   . Hypertension Mother   . Alcohol abuse Father   . Diabetes Father   . Drug abuse Father   . Heart disease Father   . Hypertension  Father   . Kidney disease Father   . Arthritis Maternal Grandmother   . Arthritis Maternal Grandfather   . Cancer Paternal Grandmother   . Diabetes Paternal Grandmother   . Heart disease Paternal Grandmother   . Diabetes Paternal Grandfather   . Heart disease Paternal Grandfather   . Kidney disease Paternal Grandfather   . Stroke Paternal Grandfather     Social History   Socioeconomic History  . Marital status: Single    Spouse name: Not on file  . Number of children: Not on file  . Years of education: Not on file  . Highest education level: Not on file  Occupational History  . Not on file  Tobacco Use  . Smoking status: Former Smoker    Packs/day: 0.25    Types: Cigarettes    Quit date: 10/01/2018    Years since quitting: 0.6  . Smokeless tobacco: Never Used  . Tobacco comment: 5-7 ciggs per day  Substance and Sexual Activity  . Alcohol use: Yes    Alcohol/week: 0.0 standard drinks    Comment: rare  . Drug use: No  . Sexual activity: Yes    Partners: Male    Birth control/protection: None  Other Topics Concern  . Not on file  Social History Narrative   48 year old and 66 y/o daughters   72 year old granddaughter - takes care of her in  the afternoon after school   Significant other - 15 years   Work: Web designer   Social Determinants of Radio broadcast assistant Strain:   . Difficulty of Paying Living Expenses: Not on file  Food Insecurity:   . Worried About Charity fundraiser in the Last Year: Not on file  . Ran Out of Food in the Last Year: Not on file  Transportation Needs:   . Lack of Transportation (Medical): Not on file  . Lack of Transportation (Non-Medical): Not on file  Physical Activity:   . Days of Exercise per Week: Not on file  . Minutes of Exercise per Session: Not on file  Stress:   . Feeling of Stress : Not on file  Social Connections:   . Frequency of Communication with Friends and Family: Not on file  . Frequency of Social  Gatherings with Friends and Family: Not on file  . Attends Religious Services: Not on file  . Active Member of Clubs or Organizations: Not on file  . Attends Archivist Meetings: Not on file  . Marital Status: Not on file  Intimate Partner Violence:   . Fear of Current or Ex-Partner: Not on file  . Emotionally Abused: Not on file  . Physically Abused: Not on file  . Sexually Abused: Not on file    Outpatient Medications Prior to Visit  Medication Sig Dispense Refill  . aspirin EC 81 MG tablet Take 1 tablet (81 mg total) by mouth daily. 150 tablet 2  . gabapentin (NEURONTIN) 100 MG capsule Take 1 capsule (100 mg total) by mouth 3 (three) times daily. (Patient taking differently: Take 200 mg by mouth 3 (three) times daily. ) 90 capsule 3  . Glycopyrrolate-Formoterol (BEVESPI AEROSPHERE) 9-4.8 MCG/ACT AERO Inhale 2 puffs into the lungs 2 (two) times a day. 10.7 g 5  . ibuprofen (ADVIL) 800 MG tablet Take 1 tablet (800 mg total) by mouth every 8 (eight) hours as needed for moderate pain. 30 tablet 3  . UNABLE TO FIND Iron infusions every other month    . Vitamin D, Ergocalciferol, (DRISDOL) 1.25 MG (50000 UT) CAPS capsule Take 1 capsule (50,000 Units total) by mouth every 7 (seven) days. 5 capsule 6  . busPIRone (BUSPAR) 10 MG tablet Take 1 tablet (10 mg total) by mouth 2 (two) times daily. (Patient not taking: Reported on 02/25/2019) 60 tablet 2   No facility-administered medications prior to visit.    Allergies  Allergen Reactions  . Codeine Itching and Nausea And Vomiting    ROS Review of Systems  Constitutional: Negative.   HENT: Negative.   Eyes: Negative.   Respiratory: Negative.   Cardiovascular: Negative.   Gastrointestinal: Negative.   Endocrine: Negative.   Genitourinary: Negative.   Musculoskeletal: Negative.   Skin: Negative.   Allergic/Immunologic: Negative.   Neurological: Negative.   Hematological: Negative.   Psychiatric/Behavioral: The patient is  nervous/anxious.       Objective:    Physical Exam  Constitutional: She is oriented to person, place, and time. She appears well-developed and well-nourished.  HENT:  Head: Normocephalic and atraumatic.  Eyes: Conjunctivae are normal.  Cardiovascular: Normal rate, regular rhythm, normal heart sounds and intact distal pulses.  Pulmonary/Chest: Effort normal and breath sounds normal.  Abdominal: Soft. Bowel sounds are normal. She exhibits distension (occasional ).  Musculoskeletal:        General: Normal range of motion.     Cervical back: Normal range of motion and neck  supple.  Neurological: She is alert and oriented to person, place, and time. She has normal reflexes.  Skin: Skin is warm and dry.  Psychiatric: She has a normal mood and affect. Her behavior is normal. Judgment and thought content normal.  Nursing note and vitals reviewed.   BP 131/79   Pulse 75   Temp 97.9 F (36.6 C) (Oral)   Ht 5\' 5"  (1.651 m)   Wt 231 lb 3.2 oz (104.9 kg)   LMP 05/05/2019   SpO2 97%   BMI 38.47 kg/m  Wt Readings from Last 3 Encounters:  05/24/19 231 lb 3.2 oz (104.9 kg)  03/14/19 228 lb (103.4 kg)  02/26/19 225 lb 11.2 oz (102.4 kg)     Health Maintenance Due  Topic Date Due  . PAP SMEAR-Modifier  02/10/1993  . INFLUENZA VACCINE  12/01/2018  . TETANUS/TDAP  05/03/2019    There are no preventive care reminders to display for this patient.  Lab Results  Component Value Date   TSH 0.961 10/14/2018   Lab Results  Component Value Date   WBC 10.2 02/26/2019   HGB 12.0 02/26/2019   HCT 37.5 02/26/2019   MCV 82.4 02/26/2019   PLT 595 (H) 02/26/2019   Lab Results  Component Value Date   NA 138 10/15/2018   K 4.1 10/15/2018   CO2 24 10/15/2018   GLUCOSE 128 (H) 10/15/2018   BUN 8 10/15/2018   CREATININE 0.76 10/15/2018   BILITOT 0.5 10/23/2018   ALKPHOS 66 10/23/2018   AST 13 10/23/2018   ALT 17 10/23/2018   PROT 6.8 10/23/2018   ALBUMIN 4.6 10/23/2018   CALCIUM 9.2  10/15/2018   ANIONGAP 9 10/15/2018   Lab Results  Component Value Date   CHOL 168 10/23/2018   Lab Results  Component Value Date   HDL 42 10/23/2018   Lab Results  Component Value Date   LDLCALC 80 10/23/2018   Lab Results  Component Value Date   TRIG 228 (H) 10/23/2018   Lab Results  Component Value Date   CHOLHDL 4.0 10/23/2018   Lab Results  Component Value Date   HGBA1C 5.6 10/23/2018      Assessment & Plan:   1. Anxiety Moderate today. We will resend referral to Psychiatry today.  - Ambulatory referral to Psychiatry  2. Paranoia ([place]) - Ambulatory referral to Psychiatry  3. Thrombocytosis ([place]) She will continue to follow up with Hematology as needed.   4. Iron deficiency anemia, unspecified iron deficiency anemia type She continues to follow up with Dr. Delight Hoh, Hematologist for Iron Deficiency Anemia.   5. Healthcare maintenance - POCT urinalysis dipstick  6. Follow up She will follow up in 6 months.  No orders of the defined types were placed in this encounter.   Orders Placed This Encounter  Procedures  . Urine Culture  . Ambulatory referral to Psychiatry  . POCT urinalysis dipstick     Referral Orders     Ambulatory referral to Psychiatry   Kathe Becton,  MSN, FNP-BC [facility] [address] [place], [place] [address] [phone]   Problem List Items Addressed This Visit      Hematopoietic and Hemostatic   Thrombocytosis ([place])     Other   Anxiety - Primary   Relevant Orders   Ambulatory referral to Psychiatry   Iron deficiency anemia    Other Visit Diagnoses    Paranoia ([place])  Relevant Orders   Ambulatory referral to Psychiatry   Healthcare maintenance       Relevant Orders   POCT urinalysis dipstick (Completed)   Follow up       Abnormal urinalysis       Relevant Orders   Urine Culture (Completed)      No  orders of the defined types were placed in this encounter.   Follow-up: Return in about 6 months (around 11/21/2019).    Azzie Glatter, FNP

## 2019-05-25 NOTE — Progress Notes (Signed)
[facility]  Telephone:([phone] [phone]  HEMATOLOGY-ONCOLOGY TELEMEDICINE VISIT PROGRESS NOTE  ID: Erin Zamora OB: [birth year]  MR#: [id number]  [id number]  Patient Care Team: Azzie Glatter, FNP as PCP - General (Family Medicine)   CHIEF COMPLAINT: Thrombocytosis, iron deficiency anemia.  INTERVAL HISTORY: Patient returns to clinic today for repeat laboratory, further evaluation, and continuation of IV iron.  She has noticed an increased weakness and fatigue over the past several weeks.  She has otherwise felt well.  She has no neurologic complaints.  She denies any recent fevers or illnesses.  She has a good appetite and denies weight loss.  She has no chest pain, shortness of breath, cough, or hemoptysis.  She denies any nausea, vomiting, constipation, or diarrhea.  She denies any melena or hematochezia.  She has no urinary complaints.  Patient offers no specific complaints today.  REVIEW OF SYSTEMS:   Review of Systems  Constitutional: Positive for malaise/fatigue. Negative for fever and weight loss.  Respiratory: Negative.  Negative for cough, hemoptysis and shortness of breath.   Cardiovascular: Negative.  Negative for chest pain and leg swelling.  Gastrointestinal: Negative.  Negative for abdominal pain.  Genitourinary: Negative.  Negative for dysuria.  Musculoskeletal: Negative.  Negative for back pain.  Skin: Negative.  Negative for rash.  Neurological: Positive for weakness. Negative for dizziness, focal weakness and headaches.  Psychiatric/Behavioral: Negative.  The patient is not nervous/anxious.     As per HPI. Otherwise, a complete review of systems is negative.  PAST MEDICAL HISTORY: Past Medical History:  Diagnosis Date  . Allergy    seasonal  . Anemia   . Anxiety   . CAP (community acquired pneumonia) 10/2018  . Cough 10/2018  . Hyperlipidemia 10/2018  . Insomnia 10/2018  . Microcytic anemia   .  Thrombocytosis ([place])   . Vitamin D deficiency 10/2018    PAST SURGICAL HISTORY: Past Surgical History:  Procedure Laterality Date  . APPENDECTOMY  1991  . CHOLECYSTECTOMY  2003  . TUBAL LIGATION  2003    FAMILY HISTORY: Family History  Problem Relation Age of Onset  . Alcohol abuse Mother   . COPD Mother   . Hypertension Mother   . Alcohol abuse Father   . Diabetes Father   . Drug abuse Father   . Heart disease Father   . Hypertension Father   . Kidney disease Father   . Arthritis Maternal Grandmother   . Arthritis Maternal Grandfather   . Cancer Paternal Grandmother   . Diabetes Paternal Grandmother   . Heart disease Paternal Grandmother   . Diabetes Paternal Grandfather   . Heart disease Paternal Grandfather   . Kidney disease Paternal Grandfather   . Stroke Paternal Grandfather     ADVANCED DIRECTIVES (Y/N):  N  HEALTH MAINTENANCE: Social History   Tobacco Use  . Smoking status: Former Smoker    Packs/day: 0.25    Types: Cigarettes    Quit date: 10/01/2018    Years since quitting: 0.6  . Smokeless tobacco: Never Used  . Tobacco comment: 5-7 ciggs per day  Substance Use Topics  . Alcohol use: Yes    Alcohol/week: 0.0 standard drinks    Comment: rare  . Drug use: No     Colonoscopy:  PAP:  Bone density:  Lipid panel:  Allergies  Allergen Reactions  . Codeine Itching and Nausea And Vomiting    Current Outpatient Medications  Medication Sig Dispense Refill  . aspirin EC 81  MG tablet Take 1 tablet (81 mg total) by mouth daily. 150 tablet 2  . gabapentin (NEURONTIN) 100 MG capsule Take 1 capsule (100 mg total) by mouth 3 (three) times daily. (Patient taking differently: Take 200 mg by mouth 3 (three) times daily. ) 90 capsule 3  . Glycopyrrolate-Formoterol (BEVESPI AEROSPHERE) 9-4.8 MCG/ACT AERO Inhale 2 puffs into the lungs 2 (two) times a day. 10.7 g 5  . ibuprofen (ADVIL) 800 MG tablet Take 1 tablet (800 mg total) by mouth every 8 (eight) hours as  needed for moderate pain. 30 tablet 3  . sulfamethoxazole-trimethoprim (BACTRIM DS) 800-160 MG tablet Take 1 tablet by mouth 2 (two) times daily. 14 tablet 0  . UNABLE TO FIND Iron infusions every other month    . Vitamin D, Ergocalciferol, (DRISDOL) 1.25 MG (50000 UT) CAPS capsule Take 1 capsule (50,000 Units total) by mouth every 7 (seven) days. 5 capsule 6   No current facility-administered medications for this visit.    OBJECTIVE: Vitals:   05/31/19 1328  BP: (!) 152/90  Pulse: 75  Resp: 20  SpO2: 100%     Body mass index is 38.77 kg/m.    ECOG FS:0 - Asymptomatic  General: Well-developed, well-nourished, no acute distress. Eyes: Pink conjunctiva, anicteric sclera. HEENT: Normocephalic, moist mucous membranes. Lungs: No audible wheezing or coughing. Heart: Regular rate and rhythm. Abdomen: Soft, nontender, no obvious distention. Musculoskeletal: No edema, cyanosis, or clubbing. Neuro: Alert, answering all questions appropriately. Cranial nerves grossly intact. Skin: No rashes or petechiae noted. Psych: Normal affect.  LAB RESULTS:  Lab Results  Component Value Date   NA 138 10/15/2018   K 4.1 10/15/2018   CL 105 10/15/2018   CO2 24 10/15/2018   GLUCOSE 128 (H) 10/15/2018   BUN 8 10/15/2018   CREATININE 0.76 10/15/2018   CALCIUM 9.2 10/15/2018   PROT 6.8 10/23/2018   ALBUMIN 4.6 10/23/2018   AST 13 10/23/2018   ALT 17 10/23/2018   ALKPHOS 66 10/23/2018   BILITOT 0.5 10/23/2018   GFRNONAA >60 10/15/2018   GFRAA >60 10/15/2018    Lab Results  Component Value Date   WBC 9.8 05/31/2019   NEUTROABS 6.0 05/31/2019   HGB 10.9 (L) 05/31/2019   HCT 36.0 05/31/2019   MCV 80.7 05/31/2019   PLT 577 (H) 05/31/2019   Lab Results  Component Value Date   IRON 20 (L) 02/26/2019   TIBC 489 (H) 02/26/2019   IRONPCTSAT 4 (L) 02/26/2019   Lab Results  Component Value Date   FERRITIN 5 (L) 02/26/2019     STUDIES: No results found.  ASSESSMENT: Thrombocytosis,  iron deficiency anemia.  PLAN:  1.  Iron deficiency anemia: Patient's hemoglobin has trended down and she is more symptomatic.  Iron stores are pending at time of dictation, but were decreased with last lab draw in October 2020.  Proceed with 200 mg IV Venofer today.  Return to clinic in 1 and 2 weeks for IV Venofer only.  Patient will then return to clinic in 3 months with repeat laboratory, further evaluation, and continuation of treatment.  Although patient is only 73, she may benefit from a colonoscopy in the near future. 2. Thrombocytosis: Secondary to iron deficiency. Previously, JAK-2 mutation was negative.  IV Venofer as above. 3.  Abdominal pain: Patient does not complain of this today.  Patient expressed understanding that she can return to clinic at any time if she has any questions, concerns, or complaints.   Lloyd Huger, MD 05/31/2019  1:54 PM

## 2019-05-26 ENCOUNTER — Other Ambulatory Visit: Payer: Self-pay | Admitting: Family Medicine

## 2019-05-26 DIAGNOSIS — N390 Urinary tract infection, site not specified: Secondary | ICD-10-CM

## 2019-05-26 DIAGNOSIS — B9620 Unspecified Escherichia coli [E. coli] as the cause of diseases classified elsewhere: Secondary | ICD-10-CM

## 2019-05-26 DIAGNOSIS — F22 Delusional disorders: Secondary | ICD-10-CM | POA: Insufficient documentation

## 2019-05-26 MED ORDER — SULFAMETHOXAZOLE-TRIMETHOPRIM 800-160 MG PO TABS: 1.0000 | ORAL_TABLET | Freq: Two times a day (BID) | ORAL | 0 refills | Status: DC

## 2019-05-29 LAB — URINE CULTURE

## 2019-05-30 ENCOUNTER — Other Ambulatory Visit: Payer: Self-pay

## 2019-05-30 NOTE — Progress Notes (Signed)
Patient pre screened for office appointment, no questions or concerns today. Patient reminded of upcoming appointment time and date. 

## 2019-05-31 ENCOUNTER — Other Ambulatory Visit: Payer: Self-pay

## 2019-05-31 ENCOUNTER — Inpatient Hospital Stay: Payer: 59 | Attending: Oncology

## 2019-05-31 ENCOUNTER — Inpatient Hospital Stay: Payer: 59

## 2019-05-31 ENCOUNTER — Other Ambulatory Visit: Payer: Self-pay | Admitting: Emergency Medicine

## 2019-05-31 ENCOUNTER — Other Ambulatory Visit: Payer: Self-pay | Admitting: Oncology

## 2019-05-31 ENCOUNTER — Inpatient Hospital Stay (HOSPITAL_BASED_OUTPATIENT_CLINIC_OR_DEPARTMENT_OTHER): Payer: 59 | Admitting: Oncology

## 2019-05-31 VITALS — BP 152/90 | HR 75 | Resp 20 | Wt 233.0 lb

## 2019-05-31 DIAGNOSIS — Z7982 Long term (current) use of aspirin: Secondary | ICD-10-CM | POA: Insufficient documentation

## 2019-05-31 DIAGNOSIS — Z79899 Other long term (current) drug therapy: Secondary | ICD-10-CM | POA: Insufficient documentation

## 2019-05-31 DIAGNOSIS — F419 Anxiety disorder, unspecified: Secondary | ICD-10-CM | POA: Insufficient documentation

## 2019-05-31 DIAGNOSIS — R5383 Other fatigue: Secondary | ICD-10-CM | POA: Insufficient documentation

## 2019-05-31 DIAGNOSIS — D751 Secondary polycythemia: Secondary | ICD-10-CM | POA: Diagnosis not present

## 2019-05-31 DIAGNOSIS — D509 Iron deficiency anemia, unspecified: Secondary | ICD-10-CM

## 2019-05-31 DIAGNOSIS — E785 Hyperlipidemia, unspecified: Secondary | ICD-10-CM | POA: Diagnosis not present

## 2019-05-31 DIAGNOSIS — R109 Unspecified abdominal pain: Secondary | ICD-10-CM | POA: Insufficient documentation

## 2019-05-31 DIAGNOSIS — Z87891 Personal history of nicotine dependence: Secondary | ICD-10-CM | POA: Diagnosis not present

## 2019-05-31 LAB — CBC WITH DIFFERENTIAL/PLATELET
Abs Immature Granulocytes: 0.05 10*3/uL (ref 0.00–0.07)
Basophils Absolute: 0.1 10*3/uL (ref 0.0–0.1)
Basophils Relative: 1 %
Eosinophils Absolute: 0.4 10*3/uL (ref 0.0–0.5)
Eosinophils Relative: 4 %
HCT: 36 % (ref 36.0–46.0)
Hemoglobin: 10.9 g/dL — ABNORMAL LOW (ref 12.0–15.0)
Immature Granulocytes: 1 %
Lymphocytes Relative: 25 %
Lymphs Abs: 2.5 10*3/uL (ref 0.7–4.0)
MCH: 24.4 pg — ABNORMAL LOW (ref 26.0–34.0)
MCHC: 30.3 g/dL (ref 30.0–36.0)
MCV: 80.7 fL (ref 80.0–100.0)
Monocytes Absolute: 0.7 10*3/uL (ref 0.1–1.0)
Monocytes Relative: 8 %
Neutro Abs: 6 10*3/uL (ref 1.7–7.7)
Neutrophils Relative %: 61 %
Platelets: 577 10*3/uL — ABNORMAL HIGH (ref 150–400)
RBC: 4.46 MIL/uL (ref 3.87–5.11)
RDW: 14.1 % (ref 11.5–15.5)
WBC: 9.8 10*3/uL (ref 4.0–10.5)
nRBC: 0 % (ref 0.0–0.2)

## 2019-05-31 LAB — IRON AND TIBC
Iron: 26 ug/dL — ABNORMAL LOW (ref 28–170)
Saturation Ratios: 5 % — ABNORMAL LOW (ref 10.4–31.8)
TIBC: 501 ug/dL — ABNORMAL HIGH (ref 250–450)
UIBC: 475 ug/dL

## 2019-05-31 LAB — FERRITIN: Ferritin: 4 ng/mL — ABNORMAL LOW (ref 11–307)

## 2019-05-31 MED ORDER — IRON SUCROSE 20 MG/ML IV SOLN
200.0000 mg | Freq: Once | INTRAVENOUS | Status: AC
  Administered 2019-05-31: 200 mg via INTRAVENOUS
  Filled 2019-05-31: qty 10

## 2019-05-31 MED ORDER — SODIUM CHLORIDE 0.9 % IV SOLN
Freq: Once | INTRAVENOUS | Status: AC
  Filled 2019-05-31: qty 250

## 2019-06-06 ENCOUNTER — Other Ambulatory Visit: Payer: Self-pay

## 2019-06-07 ENCOUNTER — Inpatient Hospital Stay: Payer: 59 | Attending: Oncology

## 2019-06-07 ENCOUNTER — Other Ambulatory Visit: Payer: Self-pay

## 2019-06-07 VITALS — BP 152/89 | HR 74 | Temp 98.1°F | Resp 18

## 2019-06-07 DIAGNOSIS — D509 Iron deficiency anemia, unspecified: Secondary | ICD-10-CM

## 2019-06-07 DIAGNOSIS — Z79899 Other long term (current) drug therapy: Secondary | ICD-10-CM | POA: Diagnosis not present

## 2019-06-07 MED ORDER — IRON SUCROSE 20 MG/ML IV SOLN
200.0000 mg | Freq: Once | INTRAVENOUS | Status: AC
  Administered 2019-06-07: 13:00:00 200 mg via INTRAVENOUS
  Filled 2019-06-07: qty 10

## 2019-06-07 MED ORDER — SODIUM CHLORIDE 0.9 % IV SOLN
Freq: Once | INTRAVENOUS | Status: AC
  Filled 2019-06-07: qty 250

## 2019-06-13 ENCOUNTER — Other Ambulatory Visit: Payer: Self-pay

## 2019-06-14 ENCOUNTER — Inpatient Hospital Stay: Payer: 59

## 2019-06-14 ENCOUNTER — Other Ambulatory Visit: Payer: Self-pay

## 2019-06-14 VITALS — BP 137/79 | HR 75 | Temp 97.0°F | Resp 18

## 2019-06-14 DIAGNOSIS — D509 Iron deficiency anemia, unspecified: Secondary | ICD-10-CM | POA: Diagnosis not present

## 2019-06-14 MED ORDER — SODIUM CHLORIDE 0.9 % IV SOLN
Freq: Once | INTRAVENOUS | Status: AC
  Filled 2019-06-14: qty 250

## 2019-06-14 MED ORDER — IRON SUCROSE 20 MG/ML IV SOLN
200.0000 mg | Freq: Once | INTRAVENOUS | Status: AC
  Administered 2019-06-14: 200 mg via INTRAVENOUS
  Filled 2019-06-14: qty 5

## 2019-06-14 NOTE — Progress Notes (Signed)
Pt tolerated infusion well pt and VS stable at discharge.

## 2019-06-18 ENCOUNTER — Other Ambulatory Visit: Payer: Self-pay | Admitting: Family Medicine

## 2019-06-18 ENCOUNTER — Other Ambulatory Visit: Payer: Self-pay

## 2019-06-18 DIAGNOSIS — G8929 Other chronic pain: Secondary | ICD-10-CM

## 2019-07-16 ENCOUNTER — Other Ambulatory Visit: Payer: Self-pay

## 2019-07-16 ENCOUNTER — Ambulatory Visit (INDEPENDENT_AMBULATORY_CARE_PROVIDER_SITE_OTHER): Payer: 59 | Admitting: Licensed Clinical Social Worker

## 2019-07-16 DIAGNOSIS — F330 Major depressive disorder, recurrent, mild: Secondary | ICD-10-CM | POA: Diagnosis not present

## 2019-07-16 DIAGNOSIS — F411 Generalized anxiety disorder: Secondary | ICD-10-CM

## 2019-07-16 DIAGNOSIS — F22 Delusional disorders: Secondary | ICD-10-CM | POA: Diagnosis not present

## 2019-07-16 NOTE — Progress Notes (Signed)
Virtual Visit via Telephone Note  I connected with Erin Zamora on 07/16/19 at  1:00 PM EDT by telephone and verified that I am speaking with the correct person using two identifiers.   The patient was advised to call back or seek an in-person evaluation if the symptoms worsen or if the condition fails to improve as anticipated.  I provided 45 minutes of non-face-to-face time during this encounter.   [name], LCSW    Comprehensive Clinical Assessment (CCA) Note  07/16/2019 Erin Zamora [id number]  Visit Diagnosis:      ICD-10-CM   1. Mild episode of recurrent major depressive disorder ([place])  F33.0   2. Paranoia ([place])  F22   3. Generalized anxiety disorder  F41.1       CCA Part One  Part One has been completed on paper by the patient.  (See scanned document in Chart Review)  CCA Part Two A  Intake/Chief Complaint:  CCA Intake With Chief Complaint CCA Part Two Date: 07/16/19 Chief Complaint/Presenting Problem: Clt referred by PCP due to anxiety, paranoia, started on Buspar last year however did not like the way it made her feel. Reports sx of "really high anxiety", clt is tearful in discussing anxiety during this engagement, frequent crying spells, "I'm highly emotional", isolation, decreased motivation, paranoia regarding technology "I feel like I'm being listened to, recorded, watched" (2 years), feels embarrassed discussing these issues, "I don't want to sound crazy", panic attacks regarding fear that people hear what she is saying-UKN of specific trigger, Collateral Involvement: Kathe Becton referral Individual's Preferences: Medication management, open to therapy Type of Services Patient Feels Are Needed: Medication management, open to therapy Initial Clinical Notes/Concerns: See below   Client is a 49 year old female referred by PCP for medication management and therapy services due to increasing paranoia, depressive, and anxiety sx. Client reports  she was started on Buspar last year however stopped taking it because she didn't like the way it made her feel. Client identifies multiple chronic meedical issues including COPD, chronic anemia, and emphysema which requires her to regularly have iron transfusions. Client denies SI/HI however endorses increasing paranoia for approximately 2 years in which she worries that she is being listened to on the phone, in stores, and watched. Client reports she remembers one event 2 years ago in which she had a panic attack and thought someone was on a balcony listening to her however denies any specific triggers that would have led to that event. Client identifies that she is experiencing intense worrying and feeling tense at all times, client endorses increasing irritability, and is waking up throughout the night worrying with racing thoughts. Client endorses engagement in "routines every morning, I have to do everything the same" however reports that this isn't obsessive in nature rather than needing a routine to remember what needs to be done. Client reports a good relationship with family- 2 children, grandchildren, and long term partner of 18 years. Client reports that she was raised by her mother and step-father with minimal contact w/ biological father until adulthood when she took care of him in hospice as he was dying. Client is currently employed and works at least 55hr per week. Client denies any hx of psychiatric inpatient hospitalizations, SUA, or problematic substance use issues.   Mental Health Symptoms Depression:  Depression: Change in energy/activity, Tearfulness, Irritability, Hopelessness("I'm hopeless but not suicidal or anything")  Mania:  Mania: N/A  Anxiety:   Anxiety: Difficulty concentrating, Restlessness, Tension, Worrying, Sleep,  Irritability("I worry excessively", difficulty sleeping due to taking up worrying/feeling anxious)  Psychosis:  Psychosis: Delusions (paranoia)  Trauma:   n/a clt  denies trauma sx at this time  Obsessions:  Obsessions: N/A  Compulsions:  Compulsions: N/A  Inattention:  Inattention: N/A  Hyperactivity/Impulsivity:  Hyperactivity/Impulsivity: N/A  Oppositional/Defiant Behaviors:  Oppositional/Defiant Behaviors: N/A  Borderline Personality:  Emotional Irregularity: N/A  Other Mood/Personality Symptoms:  Other Mood/Personality Symtpoms: n/a   Mental Status Exam Appearance and self-care  Stature:  Stature: (n/a telephone assessment)  Weight:  Weight: (n/a telephone assessment)  Clothing:  Clothing: (n/a telephone assessment)  Grooming:  Grooming: (n/a telephone assessment)  Cosmetic use:  Cosmetic Use: (n/a telephone assessment)  Posture/gait:  Posture/Gait: (n/a telephone assessment)  Motor activity:  Motor Activity: Not Remarkable  Sensorium  Attention:  Attention: Normal  Concentration:  Concentration: Normal  Orientation:  Orientation: X5  Recall/memory:  Recall/Memory: Normal  Affect and Mood  Affect:  Affect: Depressed, Anxious  Mood:  Mood: Anxious  Relating  Eye contact:  Eye Contact: (n/a telephone assessment)  Facial expression:  Facial Expression: (n/a telephone assessment)  Attitude toward examiner:  Attitude Toward Examiner: Cooperative  Thought and Language  Speech flow: Speech Flow: Normal  Thought content:  Thought Content: Suspicious, Appropriate to mood and circumstances(clt appropriate during this engagement however endorses increasing paranoia related to technology, feeling watched/listened to)  Preoccupation:  Preoccupations: Other (Comment)(n/a clt denies)  Hallucinations:  Hallucinations: Other (Comment)(n/a clt denies)  Organization:     Transport planner of Knowledge:  Fund of Knowledge: Average  Intelligence:  Intelligence: Average  Abstraction:  Abstraction: Normal  Judgement:  Judgement: Fair  Art therapist:  Reality Testing: Adequate  Insight:  Insight: Fair  Decision Making:  Decision Making: Normal   Social Functioning  Social Maturity:  Social Maturity: Isolates  Social Judgement:  Social Judgement: Naive  Stress  Stressors:  Stressors: Grief/losses  Coping Ability:  Coping Ability: English as a second language teacher Deficits:     Supports:      Family and Psychosocial History: Family history Marital status: Long term relationship Long term relationship, how long?: 19 years What types of issues is patient dealing with in the relationship?: "We've been up and down for a while, it hasn't been induced by my situation but also I'm not allowing the relatinship to get any better. I don't feel like trying then I feel bad" Are you sexually active?: Yes What is your sexual orientation?: heterosexual Has your sexual activity been affected by drugs, alcohol, medication, or emotional stress?: n/a Does patient have children?: Yes How many children?: 2 How is patient's relationship with their children?: Talks 3-4x daily w/ oldest child (53yo), youngest daughter (22yo) still lives in the home. "My attitude towards things and they say I'm not remembering things, I get frustrated and we have an argument but overall have a good relationship"  Childhood History:  Childhood History By whom was/is the patient raised?: Mother/father and step-parent Additional childhood history information: Raised by mother and step-father, "It was fine, I was well taken care of. I was a rebellious teenager", Minimal contact w/ bio father until adulthood where she took care of him in hospice prior to his passing "It was awkward". Description of patient's relationship with caregiver when they were a child: good Patient's description of current relationship with people who raised him/her: Good relationship with mother and step-father who clt refers to as "Dad", "Me and mom tend to agree to disagree" How were you disciplined when you  got in trouble as a child/adolescent?: Spankings when young, but then grounded as a teenager Does patient  have siblings?: Yes Number of Siblings: 1 Description of patient's current relationship with siblings: Talks regularly with sister (52 year age difference) Did patient suffer any verbal/emotional/physical/sexual abuse as a child?: No Did patient suffer from severe childhood neglect?: No Has patient ever been sexually abused/assaulted/raped as an adolescent or adult?: No Was the patient ever a victim of a crime or a disaster?: No Witnessed domestic violence?: No(mother and bio father had DV but prior to clt's memory) Has patient been effected by domestic violence as an adult?: No  CCA Part Two B  Employment/Work Situation: Employment / Work Copywriter, advertising Employment situation: Employed Where is patient currently employed?: [employer] long has patient been employed?: 7 years Patient's job has been impacted by current illness: Yes Describe how patient's job has been impacted: difficulty concentrating at work, memory is "horrible", constantly second guessing my self Did You Receive Any Psychiatric Treatment/Services While in the [place]?: No Are There Guns or Other Weapons in [place]?: No  Education: Museum/gallery curator Currently Attending: n/a Last Grade Completed: 12 Name of [place]: in [employer] and [place] Did You Graduate From [employer]?: Yes Did [place]?: No Did [place]?: No Did You Have An Individualized Education Program (IIEP): No Did You Have Any Difficulty At [employer]?: No  Religion: Religion/Spirituality Are You A Religious Person?: No How Might This Affect Treatment?: n/a  Leisure/Recreation: Leisure / Recreation Leisure and Hobbies: "I don't have anything, I don't have spare time"  Exercise/Diet: Exercise/Diet Do You Exercise?: No Have You Gained or Lost A Significant Amount of Weight in the Past Six Months?: Yes-Gained Number of Pounds Gained: 15(due to regular iron defusions (chronic anemia)) Do You Follow a Special Diet?:  No Do You Have Any Trouble Sleeping?: Yes Explanation of Sleeping Difficulties: wakes up throughout the night worrying, racing thoughts  CCA Part Two C  Alcohol/Drug Use: Alcohol / Drug Use Pain Medications: n/a Prescriptions: See MAR Over the Counter: n/a History of alcohol / drug use?: ("I smoked pot in my younger years, but nothing now. I might drink a beer every now and then" denies problematic use) Longest period of sobriety (when/how long): n/a Negative Consequences of Use: (n/a clt denies) Withdrawal Symptoms: (n/a clt denies)                      CCA Part Three  ASAM's:  Six Dimensions of Multidimensional Assessment  Dimension 1:  Acute Intoxication and/or Withdrawal Potential:     Dimension 2:  Biomedical Conditions and Complications:     Dimension 3:  Emotional, Behavioral, or Cognitive Conditions and Complications:     Dimension 4:  Readiness to Change:     Dimension 5:  Relapse, Continued use, or Continued Problem Potential:     Dimension 6:  Recovery/Living Environment:      Substance use Disorder (SUD)    Social Function:  Social Functioning Social Maturity: Isolates Social Judgement: Naive  Stress:  Stress Stressors: Grief/losses Coping Ability: Overwhelmed Patient Takes Medications The Way The Doctor Instructed?: Yes Priority Risk: Low Acuity  Risk Assessment- Self-Harm Potential: Risk Assessment For Self-Harm Potential Thoughts of Self-Harm: No current thoughts Method: No plan Availability of Means: No access/NA Additional Comments for Self-Harm Potential: n/a  Risk Assessment -Dangerous to Others Potential: Risk Assessment For Dangerous to Others Potential Method: No Plan Availability of Means: No access  or NA Intent: Vague intent or NA Notification Required: No need or identified person Additional Comments for Danger to Others Potential: n/a  DSM5 Diagnoses: Patient Active Problem List   Diagnosis Date Noted  . Paranoia ([place])  05/26/2019  . Chronic left-sided low back pain with left-sided sciatica 01/01/2019  . Numbness and tingling of left lower extremity 01/01/2019  . Iron deficiency anemia 11/09/2018  . Thrombocytosis ([place]) 11/03/2018  . COPD with chronic bronchitis and emphysema ([place]) 10/29/2018  . Cough 10/23/2018  . Chest congestion 10/23/2018  . Anxiety 10/23/2018  . Anemia 10/23/2018  . RLL pneumonia 10/13/2018  . Elevated blood pressure reading 10/13/2018  . Obesity (BMI 30.0-34.9) 10/13/2018  . Vitamin D deficiency 10/2018    Patient Centered Plan: Patient is on the following Treatment Plan(s):  Anxiety and Depression  Recommendations for Services/Supports/Treatments: Recommendations for Services/Supports/Treatments Recommendations For Services/Supports/Treatments: Individual Therapy, Medication Management  Treatment Plan Summary:  Client will report decreased paranoia, anxiety, and depressive sx 4/7 days per week and will resume normal functioning with increased motivation towards activities she once found pleasurable. Client will learn and utilize healthy coping skills. Client will take medications as prescribed 100% of the time.  [name], MSW, LCSW

## 2019-07-17 ENCOUNTER — Ambulatory Visit (INDEPENDENT_AMBULATORY_CARE_PROVIDER_SITE_OTHER): Payer: 59 | Admitting: Psychiatry

## 2019-07-17 ENCOUNTER — Encounter: Payer: Self-pay | Admitting: Psychiatry

## 2019-07-17 ENCOUNTER — Other Ambulatory Visit: Payer: Self-pay

## 2019-07-17 DIAGNOSIS — F411 Generalized anxiety disorder: Secondary | ICD-10-CM

## 2019-07-17 DIAGNOSIS — F331 Major depressive disorder, recurrent, moderate: Secondary | ICD-10-CM | POA: Diagnosis not present

## 2019-07-17 DIAGNOSIS — F22 Delusional disorders: Secondary | ICD-10-CM | POA: Diagnosis not present

## 2019-07-17 MED ORDER — SERTRALINE HCL 50 MG PO TABS: 50.0000 mg | ORAL_TABLET | Freq: Every day | ORAL | 1 refills | Status: DC

## 2019-07-17 NOTE — Progress Notes (Signed)
Psychiatric Initial Adult Assessment   I connected with  Erin Zamora on 07/17/19 by a video enabled telemedicine application and verified that I am speaking with the correct person using two identifiers.   I discussed the limitations of evaluation and management by telemedicine. The patient expressed understanding and agreed to proceed.    Patient Identification: Erin Zamora MRN:  [id number] Date of Evaluation:  07/17/2019    Referral Source: Therapist Ms. Lovena Le  Chief Complaint:   " My anxiety is gotten really bad."  Visit Diagnosis:    ICD-10-CM   1. MDD (major depressive disorder), recurrent episode, moderate (HCC)  F33.1   2. Generalized anxiety disorder  F41.1   3. Paranoia ([place])  F22     History of Present Illness: This is a 49 year old female with past history of depression, anxiety now seen for psychiatric evaluation.  Patient was seen for initial intake/CCA by Ms. Lovena Le yesterday.  Patient reported that for the past 2 years or so she has been extremely anxious with depressive symptoms.  She reported that she works long hours and is on call all the time.  She is financially responsible for her partner and her 83 year old daughter.  She stated that she feels that she is needed all the time and she can never get any personal time for herself.  She also mentioned that she did not have a relationship with her biological father while growing up as he chose not to connect with her.  However she did take care of him during his last few months as she was has only next of kin.  She stated that he passed away last year and she still has some grief about that.   She stated that she has been feeling emotional most of the time with tearfulness and crying spells.  She stated that she has no energy to get out of bed and take care of her routine.  She stated she has to force herself to get out of bed so that she can get ready to go to work.  She reported poor concentration with  easy distractibility.  She is having a hard time retaining information.  She reported poor appetite.  She also stated that she does not have difficulty falling asleep however does have hard time staying asleep.  She wakes up several times during the night. She she has a hard time relaxing herself at the end of the day and feels on edge all the time.  She over thinks about trivial issues and has racing thoughts at times.  She becomes focused on minor issues and then has a hard time moving on.  She gave an example of how when she gets home and she does not see any of the chores done that she had told her partner or daughter to do she gets really upset. She stated that she feels anxious about things going wrong and how she is going to manage them.  She stated that she is always thinking 1 step ahead and therefore never focuses on what she is doing and then later worries about not doing her work well. She denied any current or positive suicidal ideations, denied any suicide attempts. Patient also reported feeling paranoid when being around technology specifically phones with cameras.  She stated that for the past couple of years she has been noticing that she is really paranoid about anyone using a phone with camera around her.  She would worry if they are recording her.  She worries when some one is talking on the phone but has their camera pointed towards her they maybe recording her.  She worries about her conversation with someone else been recorded especially she is talking about someone else.  She stated she does not mind if somebody is recording when she is talking about herself.  She stated that her paranoia has gotten so bad that she has taken herself off of all kind of social media platforms and does not use any social media. She does not have difficulty in answering her phone for work.  She stated that there have been times when she has left her phone in the kitchen and said to bring it to her bedroom  while sleeping.  However since she is on call all the time she has to keep the phone in her room. She denied any auditory or visual hallucinations.  She denied any persecutory delusions, she denied any ideas of reference. Patient denied any history of PTSD. She denied symptom suggestive of hypomania or mania.  Past medications tried-has taken Paxil around the time her older daughter was born.  Took Zoloft which did help her mood and anxiety.  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  Her work hours are 7 AM to 5 PM daily Monday to Friday.  She is on call all the time although does not get too many calls.  She does not have to work weekends but she goes to work a few times during the week on the weekends so that she can complete her work and not have a hectic schedule during the following week.  Patient reported that she has not taken some time off to do something for herself in a long time.  Last time she was off for a few days ago was around Christmas.  The only thing that she enjoys doing now is spending time with her grandchildren.  She lives with her 48 year old daughter and partner of about 20 years.  She has an older daughter who is 23 and has 2 grandchildren age 87 and 97.  Medical history-patient has history of iron deficiency anemia for which she gets iron infusions.  She also reported that she has chronic back pain for which she takes gabapentin 200 mg at bedtime.  She does not take it during the day as it makes her drowsy.    Associated Signs/Symptoms: Depression Symptoms:  depressed mood, anhedonia, insomnia, difficulty concentrating, anxiety, loss of energy/fatigue, decreased appetite, (Hypo) Manic Symptoms:  denied Anxiety Symptoms:  Excessive Worry, Psychotic Symptoms:  Paranoia, PTSD Symptoms: Negative  Past Psychiatric History: Depression, anxiety  Previous Psychotropic Medications: Yes   Substance Abuse History in the last 12 months:   No.  Consequences of Substance Abuse: Negative  Past Medical History:  Past Medical History:  Diagnosis Date  . Allergy    seasonal  . Anemia   . Anxiety   . CAP (community acquired pneumonia) 10/2018  . Cough 10/2018  . Hyperlipidemia 10/2018  . Insomnia 10/2018  . Microcytic anemia   . Thrombocytosis ([place])   . Vitamin D deficiency 10/2018    Past Surgical History:  Procedure Laterality Date  . APPENDECTOMY  1991  . CHOLECYSTECTOMY  2003  . TUBAL LIGATION  2003    Family Psychiatric History: She suspects history of anxiety and depression both her parents.  Family History:  Family History  Problem Relation Age of Onset  . Alcohol abuse Mother   . COPD Mother   .  Hypertension Mother   . Alcohol abuse Father   . Diabetes Father   . Drug abuse Father   . Heart disease Father   . Hypertension Father   . Kidney disease Father   . Arthritis Maternal Grandmother   . Arthritis Maternal Grandfather   . Cancer Paternal Grandmother   . Diabetes Paternal Grandmother   . Heart disease Paternal Grandmother   . Diabetes Paternal Grandfather   . Heart disease Paternal Grandfather   . Kidney disease Paternal Grandfather   . Stroke Paternal Grandfather     Social History:   Social History   Socioeconomic History  . Marital status: Single    Spouse name: Not on file  . Number of children: Not on file  . Years of education: Not on file  . Highest education level: Not on file  Occupational History  . Not on file  Tobacco Use  . Smoking status: Former Smoker    Packs/day: 0.25    Types: Cigarettes    Quit date: 10/01/2018    Years since quitting: 0.7  . Smokeless tobacco: Never Used  . Tobacco comment: 5-7 ciggs per day  Substance and Sexual Activity  . Alcohol use: Yes    Alcohol/week: 0.0 standard drinks    Comment: rare  . Drug use: No  . Sexual activity: Yes    Partners: Male    Birth control/protection: None  Other Topics Concern  . Not on file  Social  History Narrative   48 year old and 51 y/o daughters   9 year old granddaughter - takes care of her in the afternoon after school   Significant other - 15 years   Work: Web designer   Social Determinants of Radio broadcast assistant Strain:   . Difficulty of Paying Living Expenses:   Food Insecurity:   . Worried About Charity fundraiser in the Last Year:   . Arboriculturist in the Last Year:   Transportation Needs:   . Film/video editor (Medical):   Marland Kitchen Lack of Transportation (Non-Medical):   Physical Activity:   . Days of Exercise per Week:   . Minutes of Exercise per Session:   Stress:   . Feeling of Stress :   Social Connections:   . Frequency of Communication with Friends and Family:   . Frequency of Social Gatherings with Friends and Family:   . Attends Religious Services:   . Active Member of Clubs or Organizations:   . Attends Archivist Meetings:   Marland Kitchen Marital Status:     Additional Social History: See HPI  Allergies:   Allergies  Allergen Reactions  . Codeine Itching and Nausea And Vomiting    Metabolic Disorder Labs: Lab Results  Component Value Date   HGBA1C 5.6 10/23/2018   No results found for: PROLACTIN Lab Results  Component Value Date   CHOL 168 10/23/2018   TRIG 228 (H) 10/23/2018   HDL 42 10/23/2018   CHOLHDL 4.0 10/23/2018   [place] 80 10/23/2018   Lab Results  Component Value Date   TSH 0.961 10/14/2018    Therapeutic Level Labs: No results found for: LITHIUM No results found for: CBMZ No results found for: VALPROATE  Current Medications: Current Outpatient Medications  Medication Sig Dispense Refill  . aspirin EC 81 MG tablet Take 1 tablet (81 mg total) by mouth daily. 150 tablet 2  . gabapentin (NEURONTIN) 100 MG capsule Take 1 capsule (100 mg total) by mouth  3 (three) times daily. (Patient taking differently: Take 200 mg by mouth 3 (three) times daily. ) 90 capsule 3  . Glycopyrrolate-Formoterol  (BEVESPI AEROSPHERE) 9-4.8 MCG/ACT AERO Inhale 2 puffs into the lungs 2 (two) times a day. 10.7 g 5  . ibuprofen (ADVIL) 800 MG tablet TAKE 1 TABLET BY MOUTH EVERY 8 HOURS AS NEEDED FOR MODERATE PAIN 30 tablet 0  . sulfamethoxazole-trimethoprim (BACTRIM DS) 800-160 MG tablet Take 1 tablet by mouth 2 (two) times daily. 14 tablet 0  . UNABLE TO FIND Iron infusions every other month    . Vitamin D, Ergocalciferol, (DRISDOL) 1.25 MG (50000 UT) CAPS capsule Take 1 capsule (50,000 Units total) by mouth every 7 (seven) days. 5 capsule 6   No current facility-administered medications for this visit.    Musculoskeletal: Strength & Muscle Tone: unable to assess due to telemed visit Gait & Station: unable to assess due to telemed visit Patient leans: unable to assess due to telemed visit  Psychiatric Specialty Exam: Review of Systems  There were no vitals taken for this visit.There is no height or weight on file to calculate BMI.  General Appearance: Fairly Groomed, wearing work uniform  Engineer, water:  Good  Speech:  Clear and Coherent and Normal Rate  Volume:  Normal  Mood:  Depressed and Tearful  Affect:  Tearful  Thought Process:  Goal Directed and Descriptions of Associations: Intact  Orientation:  Full (Time, Place, and Person)  Thought Content:  Logical  Suicidal Thoughts:  No  Homicidal Thoughts:  No  Memory:  Immediate;   Good Recent;   Good  Judgement:  Fair  Insight:  Good  Psychomotor Activity:  Normal  Concentration:  Concentration: Good and Attention Span: Good  Recall:  Good  Fund of Knowledge:Good  Language: Good  Akathisia:  Negative  Handed:  Right  AIMS (if indicated):  not done  Assets:  Communication Skills Desire for Improvement Financial Resources/Insurance Housing  ADL's:  Intact  Cognition: WNL  Sleep:  Fair   Screenings: PHQ2-9     Office Visit from 01/01/2019 in [place] Office Visit from 11/20/2018 in [place]  Office Visit from 10/23/2018 in [place] Office Visit from 12/22/2015 in Primary Care at [place] from 06/08/2015 in Primary Care at [facility] Total Score  0  0  0  0  0      Assessment and Plan: Based on her assessment, patient meets criteria for MDD recurrent moderate episode and generalized anxiety disorder.  She also is exhibiting paranoid ideations however isolated paranoia could be pertaining more to her mood and anxiety and will monitor for now.  She was offered Zoloft to target her symptoms of depression and anxiety. Potential side effects of medication and risks vs benefits of treatment vs non-treatment were explained and discussed. All questions were answered. Patient has taken Zoloft in the past and has responded well.  Patient was informed that if her paranoid ideations get worse or fail to improve as her mood anxiety improve then may have to add an antipsychotic to target the symptoms in the future. Patient was provided with supportive psychotherapy and was encouraged to make some changes in her schedule so that she can carve out some personal time to decompress.  1. MDD (major depressive disorder), recurrent episode, moderate (HCC)  - Start sertraline (ZOLOFT) 50 MG tablet; Take 1 tablet (50 mg total) by mouth daily.  Dispense: 30  tablet; Refill: 1  2. Generalized anxiety disorder  - Start sertraline (ZOLOFT) 50 MG tablet; Take 1 tablet (50 mg total) by mouth daily.  Dispense: 30 tablet; Refill: 1  3. Paranoia ([place]) - Will monitor for now, anticipate improvement as her mood and anxiety improve. Will add antipsychotic if symptoms worsen or fail to improve.  Starting individual therapy session with Ms. Taylor P. F/up in 6 weeks.    [place] Crane, MD 3/17/202110:21 AM

## 2019-07-18 ENCOUNTER — Ambulatory Visit: Payer: Self-pay | Admitting: Psychiatry

## 2019-08-06 ENCOUNTER — Other Ambulatory Visit: Payer: Self-pay | Admitting: Family Medicine

## 2019-08-06 ENCOUNTER — Telehealth: Payer: Self-pay | Admitting: Family Medicine

## 2019-08-06 DIAGNOSIS — Z0000 Encounter for general adult medical examination without abnormal findings: Secondary | ICD-10-CM

## 2019-08-06 DIAGNOSIS — K047 Periapical abscess without sinus: Secondary | ICD-10-CM

## 2019-08-06 MED ORDER — AMOXICILLIN-POT CLAVULANATE 875-125 MG PO TABS: 1.0000 | ORAL_TABLET | Freq: Two times a day (BID) | ORAL | 0 refills | Status: AC

## 2019-08-07 NOTE — Telephone Encounter (Signed)
Done

## 2019-08-24 NOTE — Progress Notes (Signed)
Erin Zamora  Telephone:([phone] [phone]  HEMATOLOGY-ONCOLOGY TELEMEDICINE VISIT PROGRESS NOTE  ID: Erin Zamora OB: December 25, 1970  MR#: [id number]  [id number]  Patient Care Team: Azzie Glatter, FNP as PCP - General (Family Medicine) Lloyd Huger, MD as Consulting Physician (Oncology)   CHIEF COMPLAINT: Thrombocytosis, iron deficiency anemia.  INTERVAL HISTORY: Patient returns to clinic today for repeat laboratory work, further evaluation, and continuation of IV iron.  She has some mild fatigue which she attributes to her work schedule, but otherwise feels well.  She has no neurologic complaints.  She denies any recent fevers or illnesses.  She has a good appetite and denies weight loss.  She has no chest pain, shortness of breath, cough, or hemoptysis.  She denies any nausea, vomiting, constipation, or diarrhea.  She denies any melena or hematochezia.  She has no urinary complaints.  Patient offers no further specific complaints today.  REVIEW OF SYSTEMS:   Review of Systems  Constitutional: Positive for malaise/fatigue. Negative for fever and weight loss.  Respiratory: Negative.  Negative for cough, hemoptysis and shortness of breath.   Cardiovascular: Negative.  Negative for chest pain and leg swelling.  Gastrointestinal: Negative.  Negative for abdominal pain.  Genitourinary: Negative.  Negative for dysuria.  Musculoskeletal: Negative.  Negative for back pain.  Skin: Negative.  Negative for rash.  Neurological: Negative.  Negative for dizziness, focal weakness, weakness and headaches.  Psychiatric/Behavioral: Negative.  The patient is not nervous/anxious.     As per HPI. Otherwise, a complete review of systems is negative.  PAST MEDICAL HISTORY: Past Medical History:  Diagnosis Date  . Allergy    seasonal  . Anemia   . Anxiety   . CAP (community acquired pneumonia) 10/2018  . Cough 10/2018  . Hyperlipidemia 10/2018    . Insomnia 10/2018  . Microcytic anemia   . Thrombocytosis ([place])   . Vitamin D deficiency 10/2018    PAST SURGICAL HISTORY: Past Surgical History:  Procedure Laterality Date  . APPENDECTOMY  1991  . CHOLECYSTECTOMY  2003  . TUBAL LIGATION  2003    FAMILY HISTORY: Family History  Problem Relation Age of Onset  . Alcohol abuse Mother   . COPD Mother   . Hypertension Mother   . Alcohol abuse Father   . Diabetes Father   . Drug abuse Father   . Heart disease Father   . Hypertension Father   . Kidney disease Father   . Arthritis Maternal Grandmother   . Arthritis Maternal Grandfather   . Cancer Paternal Grandmother   . Diabetes Paternal Grandmother   . Heart disease Paternal Grandmother   . Diabetes Paternal Grandfather   . Heart disease Paternal Grandfather   . Kidney disease Paternal Grandfather   . Stroke Paternal Grandfather     ADVANCED DIRECTIVES (Y/N):  N  HEALTH MAINTENANCE: Social History   Tobacco Use  . Smoking status: Former Smoker    Packs/day: 0.25    Types: Cigarettes    Quit date: 10/01/2018    Years since quitting: 0.9  . Smokeless tobacco: Never Used  . Tobacco comment: 5-7 ciggs per day  Substance Use Topics  . Alcohol use: Yes    Alcohol/week: 0.0 standard drinks    Comment: rare  . Drug use: No     Colonoscopy:  PAP:  Bone density:  Lipid panel:  Allergies  Allergen Reactions  . Codeine Itching and Nausea And Vomiting    Current Outpatient Medications  Medication  Sig Dispense Refill  . gabapentin (NEURONTIN) 100 MG capsule Take 1 capsule (100 mg total) by mouth 3 (three) times daily. (Patient taking differently: Take 200 mg by mouth at bedtime. ) 90 capsule 3  . Glycopyrrolate-Formoterol (BEVESPI AEROSPHERE) 9-4.8 MCG/ACT AERO Inhale 2 puffs into the lungs 2 (two) times a day. 10.7 g 5  . ibuprofen (ADVIL) 800 MG tablet TAKE 1 TABLET BY MOUTH EVERY 8 HOURS AS NEEDED FOR MODERATE PAIN 30 tablet 0  . sertraline (ZOLOFT) 50 MG  tablet Take 1 tablet (50 mg total) by mouth daily. 30 tablet 1  . Vitamin D, Ergocalciferol, (DRISDOL) 1.25 MG (50000 UT) CAPS capsule Take 1 capsule (50,000 Units total) by mouth every 7 (seven) days. 5 capsule 6   No current facility-administered medications for this visit.    OBJECTIVE: Vitals:   08/30/19 1308  BP: (!) 148/93  Pulse: 77  Resp: 18  Temp: 98 F (36.7 C)     Body mass index is 38.89 kg/m.    ECOG FS:0 - Asymptomatic  General: Well-developed, well-nourished, no acute distress. Eyes: Pink conjunctiva, anicteric sclera. HEENT: Normocephalic, moist mucous membranes. Lungs: No audible wheezing or coughing. Heart: Regular rate and rhythm. Abdomen: Soft, nontender, no obvious distention. Musculoskeletal: No edema, cyanosis, or clubbing. Neuro: Alert, answering all questions appropriately. Cranial nerves grossly intact. Skin: No rashes or petechiae noted. Psych: Normal affect.  LAB RESULTS:  Lab Results  Component Value Date   NA 138 10/15/2018   K 4.1 10/15/2018   CL 105 10/15/2018   CO2 24 10/15/2018   GLUCOSE 128 (H) 10/15/2018   BUN 8 10/15/2018   CREATININE 0.76 10/15/2018   CALCIUM 9.2 10/15/2018   PROT 6.8 10/23/2018   ALBUMIN 4.6 10/23/2018   AST 13 10/23/2018   ALT 17 10/23/2018   ALKPHOS 66 10/23/2018   BILITOT 0.5 10/23/2018   GFRNONAA >60 10/15/2018   GFRAA >60 10/15/2018    Lab Results  Component Value Date   WBC 10.8 (H) 08/30/2019   NEUTROABS 6.9 08/30/2019   HGB 12.9 08/30/2019   HCT 39.0 08/30/2019   MCV 83.5 08/30/2019   PLT 517 (H) 08/30/2019   Lab Results  Component Value Date   IRON 26 (L) 05/31/2019   TIBC 501 (H) 05/31/2019   IRONPCTSAT 5 (L) 05/31/2019   Lab Results  Component Value Date   FERRITIN 4 (L) 05/31/2019     STUDIES: No results found.  ASSESSMENT: Thrombocytosis, iron deficiency anemia.  PLAN:  1.  Iron deficiency anemia: Patient's hemoglobin has significantly improved and is now within normal  limits at 12.9.  She is also essentially asymptomatic.  Iron stores are pending at time of dictation.  She does not require additional Venofer today.  Patient last received treatment on June 14, 2019.  Return to clinic in 3 months with repeat laboratory work, further evaluation, and continuation of treatment if needed.  Although patient is only 60, she may benefit from a colonoscopy in the near future. 2. Thrombocytosis: Mild.  Secondary to iron deficiency. Previously, JAK-2 mutation was negative.  I spent a total of 20 minutes reviewing chart data, face-to-face evaluation with the patient, counseling and coordination of care as detailed above.   Patient expressed understanding that she can return to clinic at any time if she has any questions, concerns, or complaints.   Lloyd Huger, MD 08/30/2019 3:05 PM

## 2019-08-28 ENCOUNTER — Telehealth (INDEPENDENT_AMBULATORY_CARE_PROVIDER_SITE_OTHER): Payer: 59 | Admitting: Psychiatry

## 2019-08-28 ENCOUNTER — Other Ambulatory Visit: Payer: Self-pay

## 2019-08-28 ENCOUNTER — Encounter: Payer: Self-pay | Admitting: Psychiatry

## 2019-08-28 DIAGNOSIS — F22 Delusional disorders: Secondary | ICD-10-CM | POA: Diagnosis not present

## 2019-08-28 DIAGNOSIS — F3341 Major depressive disorder, recurrent, in partial remission: Secondary | ICD-10-CM | POA: Diagnosis not present

## 2019-08-28 DIAGNOSIS — F411 Generalized anxiety disorder: Secondary | ICD-10-CM | POA: Diagnosis not present

## 2019-08-28 DIAGNOSIS — Z87891 Personal history of nicotine dependence: Secondary | ICD-10-CM

## 2019-08-28 MED ORDER — SERTRALINE HCL 50 MG PO TABS: 50.0000 mg | ORAL_TABLET | Freq: Every day | ORAL | 1 refills | Status: DC

## 2019-08-28 NOTE — Progress Notes (Signed)
BH MD/PA/NP OP Progress Note  I connected with  Erin Zamora on 08/28/19 by a video enabled telemedicine application and verified that I am speaking with the correct person using two identifiers.   I discussed the limitations of evaluation and management by telemedicine. The patient expressed understanding and agreed to proceed.    08/28/2019 8:43 AM Erin Zamora  MRN:  [id number]  Chief Complaint: " I am doing better than before."   HPI: Patient was seen 6 weeks ago for ongoing depression and generalized anxiety disorder symptoms.  She was also reporting paranoia about her phone being tracked and her conversations been recorded.  She was started on Zoloft to target her depression and anxiety symptoms.  Her paranoia deemed to be in context of the significant stress she was undergoing. Today, patient reported that she has noticed improvement in her depression and anxiety symptoms since she started taking Zoloft regularly.  She stated that she does not panic as she used to.  She is more cognizant of her reactions to stress.  She does not get anxious easily.  She has improved energy levels.  She has also become more consciously aware of her paranoid ideations and is now able to tell herself that she does not need to worry about anyone watching her or recording her conversations on the phone. She stated that she feels she is on the right track.  She did state that she still needs to work on a lot of changes in her life.  She is still overwhelmed due to work commitments and needs to get a better handle on that.  She is starting individual therapy with Ms. Lovena Le next week and is looking forward to getting some support on her coping skills. She stated that she feels this current dose is helpful and she does not think she needs to adjust or add anything at this point.  Visit Diagnosis:    ICD-10-CM   1. Recurrent major depressive disorder, in partial remission ([place])  F33.41   2.  Generalized anxiety disorder  F41.1   3. Paranoia ([place])- improving  F22     Past Psychiatric History: MDD, anxiety  Past Medical History:  Past Medical History:  Diagnosis Date  . Allergy    seasonal  . Anemia   . Anxiety   . CAP (community acquired pneumonia) 10/2018  . Cough 10/2018  . Hyperlipidemia 10/2018  . Insomnia 10/2018  . Microcytic anemia   . Thrombocytosis ([place])   . Vitamin D deficiency 10/2018    Past Surgical History:  Procedure Laterality Date  . APPENDECTOMY  1991  . CHOLECYSTECTOMY  2003  . TUBAL LIGATION  2003    Family Psychiatric History: see below  Family History:  Family History  Problem Relation Age of Onset  . Alcohol abuse Mother   . COPD Mother   . Hypertension Mother   . Alcohol abuse Father   . Diabetes Father   . Drug abuse Father   . Heart disease Father   . Hypertension Father   . Kidney disease Father   . Arthritis Maternal Grandmother   . Arthritis Maternal Grandfather   . Cancer Paternal Grandmother   . Diabetes Paternal Grandmother   . Heart disease Paternal Grandmother   . Diabetes Paternal Grandfather   . Heart disease Paternal Grandfather   . Kidney disease Paternal Grandfather   . Stroke Paternal Grandfather     Social History:  Social History   Socioeconomic History  . Marital  status: Single    Spouse name: Not on file  . Number of children: Not on file  . Years of education: Not on file  . Highest education level: Not on file  Occupational History  . Not on file  Tobacco Use  . Smoking status: Former Smoker    Packs/day: 0.25    Types: Cigarettes    Quit date: 10/01/2018    Years since quitting: 0.9  . Smokeless tobacco: Never Used  . Tobacco comment: 5-7 ciggs per day  Substance and Sexual Activity  . Alcohol use: Yes    Alcohol/week: 0.0 standard drinks    Comment: rare  . Drug use: No  . Sexual activity: Yes    Partners: Male    Birth control/protection: None  Other Topics Concern  . Not on  file  Social History Narrative   48 year old and 78 y/o daughters   55 year old granddaughter - takes care of her in the afternoon after school   Significant other - 15 years   Work: Web designer   Social Determinants of Radio broadcast assistant Strain:   . Difficulty of Paying Living Expenses:   Food Insecurity:   . Worried About Charity fundraiser in the Last Year:   . Arboriculturist in the Last Year:   Transportation Needs:   . Film/video editor (Medical):   Marland Kitchen Lack of Transportation (Non-Medical):   Physical Activity:   . Days of Exercise per Week:   . Minutes of Exercise per Session:   Stress:   . Feeling of Stress :   Social Connections:   . Frequency of Communication with Friends and Family:   . Frequency of Social Gatherings with Friends and Family:   . Attends Religious Services:   . Active Member of Clubs or Organizations:   . Attends Archivist Meetings:   Marland Kitchen Marital Status:     Allergies:  Allergies  Allergen Reactions  . Codeine Itching and Nausea And Vomiting    Metabolic Disorder Labs: Lab Results  Component Value Date   HGBA1C 5.6 10/23/2018   No results found for: PROLACTIN Lab Results  Component Value Date   CHOL 168 10/23/2018   TRIG 228 (H) 10/23/2018   HDL 42 10/23/2018   CHOLHDL 4.0 10/23/2018   [place] 80 10/23/2018   Lab Results  Component Value Date   TSH 0.961 10/14/2018    Therapeutic Level Labs: No results found for: LITHIUM No results found for: VALPROATE No components found for:  CBMZ  Current Medications: Current Outpatient Medications  Medication Sig Dispense Refill  . aspirin EC 81 MG tablet Take 1 tablet (81 mg total) by mouth daily. 150 tablet 2  . gabapentin (NEURONTIN) 100 MG capsule Take 1 capsule (100 mg total) by mouth 3 (three) times daily. (Patient taking differently: Take 200 mg by mouth 3 (three) times daily. ) 90 capsule 3  . Glycopyrrolate-Formoterol (BEVESPI AEROSPHERE) 9-4.8  MCG/ACT AERO Inhale 2 puffs into the lungs 2 (two) times a day. 10.7 g 5  . ibuprofen (ADVIL) 800 MG tablet TAKE 1 TABLET BY MOUTH EVERY 8 HOURS AS NEEDED FOR MODERATE PAIN 30 tablet 0  . sertraline (ZOLOFT) 50 MG tablet Take 1 tablet (50 mg total) by mouth daily. 30 tablet 1  . sulfamethoxazole-trimethoprim (BACTRIM DS) 800-160 MG tablet Take 1 tablet by mouth 2 (two) times daily. 14 tablet 0  . UNABLE TO FIND Iron infusions every other month    .  Vitamin D, Ergocalciferol, (DRISDOL) 1.25 MG (50000 UT) CAPS capsule Take 1 capsule (50,000 Units total) by mouth every 7 (seven) days. 5 capsule 6   No current facility-administered medications for this visit.     Psychiatric Specialty Exam: Review of Systems  There were no vitals taken for this visit.There is no height or weight on file to calculate BMI.  General Appearance: Well Groomed  Eye Contact:  Good  Speech:  Clear and Coherent and Normal Rate  Volume:  Normal  Mood:  Less depressed and less anxious  Affect:  Congruent  Thought Process:  Goal Directed, Linear and Descriptions of Associations: Intact  Orientation:  Full (Time, Place, and Person)  Thought Content: Logical   Suicidal Thoughts:  No  Homicidal Thoughts:  No  Memory:  Recent;   Good Remote;   Good  Judgement:  Good  Insight:  Good  Psychomotor Activity:  Normal  Concentration:  Concentration: Good and Attention Span: Good  Recall:  Good  Fund of Knowledge: Good  Language: Good  Akathisia:  Negative  Handed:  Right  AIMS (if indicated): not done  Assets:  Communication Skills Desire for Improvement Financial Resources/Insurance Housing  ADL's:  Intact  Cognition: WNL  Sleep:  Fair     Screenings: PHQ2-9     Office Visit from 01/01/2019 in [place] Office Visit from 11/20/2018 in [place] Office Visit from 10/23/2018 in [place] Office Visit from 12/22/2015 in Primary Care at [facility] from 06/08/2015 in Primary Care at [facility] Total Score  0  0  0  0  0       Assessment and Plan: Patient appears to be doing better in terms of her depression and anxiety symptoms after Zoloft was started 6 weeks ago.  She is also reporting some improvement in her paranoid symptoms.  With all this would recommend continuing Zoloft at the same dose for now.  1. Recurrent major depressive disorder, in partial remission (HCC)  -Continue sertraline (ZOLOFT) 50 MG tablet; Take 1 tablet (50 mg total) by mouth daily.  Dispense: 30 tablet; Refill: 1  2. Generalized anxiety disorder  - sertraline (ZOLOFT) 50 MG tablet; Take 1 tablet (50 mg total) by mouth daily.  Dispense: 30 tablet; Refill: 1  3. Paranoia ([place])- improving   Follow-up in 4 weeks. Starting individual therapy with Ms. Lovena Le next week.    [place] Crane, MD 08/28/2019, 8:43 AM

## 2019-08-29 ENCOUNTER — Other Ambulatory Visit: Payer: Self-pay

## 2019-08-29 ENCOUNTER — Encounter: Payer: Self-pay | Admitting: Oncology

## 2019-08-29 NOTE — Progress Notes (Signed)
Patient prescreened for appointment. Patient has no concerns or questions.  

## 2019-08-30 ENCOUNTER — Inpatient Hospital Stay (HOSPITAL_BASED_OUTPATIENT_CLINIC_OR_DEPARTMENT_OTHER): Payer: 59 | Admitting: Oncology

## 2019-08-30 ENCOUNTER — Inpatient Hospital Stay: Payer: 59 | Attending: Oncology

## 2019-08-30 ENCOUNTER — Inpatient Hospital Stay: Payer: 59

## 2019-08-30 VITALS — BP 148/93 | HR 77 | Temp 98.0°F | Resp 18 | Wt 233.7 lb

## 2019-08-30 DIAGNOSIS — E785 Hyperlipidemia, unspecified: Secondary | ICD-10-CM | POA: Insufficient documentation

## 2019-08-30 DIAGNOSIS — Z79899 Other long term (current) drug therapy: Secondary | ICD-10-CM | POA: Insufficient documentation

## 2019-08-30 DIAGNOSIS — G4700 Insomnia, unspecified: Secondary | ICD-10-CM | POA: Insufficient documentation

## 2019-08-30 DIAGNOSIS — F419 Anxiety disorder, unspecified: Secondary | ICD-10-CM | POA: Insufficient documentation

## 2019-08-30 DIAGNOSIS — D509 Iron deficiency anemia, unspecified: Secondary | ICD-10-CM | POA: Insufficient documentation

## 2019-08-30 DIAGNOSIS — E559 Vitamin D deficiency, unspecified: Secondary | ICD-10-CM | POA: Diagnosis not present

## 2019-08-30 DIAGNOSIS — D751 Secondary polycythemia: Secondary | ICD-10-CM | POA: Insufficient documentation

## 2019-08-30 DIAGNOSIS — Z87891 Personal history of nicotine dependence: Secondary | ICD-10-CM | POA: Insufficient documentation

## 2019-08-30 LAB — CBC WITH DIFFERENTIAL/PLATELET
Abs Immature Granulocytes: 0.1 10*3/uL — ABNORMAL HIGH (ref 0.00–0.07)
Basophils Absolute: 0.1 10*3/uL (ref 0.0–0.1)
Basophils Relative: 1 %
Eosinophils Absolute: 0.3 10*3/uL (ref 0.0–0.5)
Eosinophils Relative: 3 %
HCT: 39 % (ref 36.0–46.0)
Hemoglobin: 12.9 g/dL (ref 12.0–15.0)
Immature Granulocytes: 1 %
Lymphocytes Relative: 23 %
Lymphs Abs: 2.5 10*3/uL (ref 0.7–4.0)
MCH: 27.6 pg (ref 26.0–34.0)
MCHC: 33.1 g/dL (ref 30.0–36.0)
MCV: 83.5 fL (ref 80.0–100.0)
Monocytes Absolute: 0.9 10*3/uL (ref 0.1–1.0)
Monocytes Relative: 9 %
Neutro Abs: 6.9 10*3/uL (ref 1.7–7.7)
Neutrophils Relative %: 63 %
Platelets: 517 10*3/uL — ABNORMAL HIGH (ref 150–400)
RBC: 4.67 MIL/uL (ref 3.87–5.11)
RDW: 15.7 % — ABNORMAL HIGH (ref 11.5–15.5)
WBC: 10.8 10*3/uL — ABNORMAL HIGH (ref 4.0–10.5)
nRBC: 0 % (ref 0.0–0.2)

## 2019-08-30 LAB — IRON AND TIBC
Iron: 48 ug/dL (ref 28–170)
Saturation Ratios: 10 % — ABNORMAL LOW (ref 10.4–31.8)
TIBC: 487 ug/dL — ABNORMAL HIGH (ref 250–450)
UIBC: 439 ug/dL

## 2019-08-30 LAB — FERRITIN: Ferritin: 10 ng/mL — ABNORMAL LOW (ref 11–307)

## 2019-08-30 NOTE — Progress Notes (Signed)
Pt in for follow up, states nurse called yesterday for pre assessment.  Denies any concerns today.

## 2019-09-03 ENCOUNTER — Ambulatory Visit (INDEPENDENT_AMBULATORY_CARE_PROVIDER_SITE_OTHER): Payer: 59 | Admitting: Licensed Clinical Social Worker

## 2019-09-03 ENCOUNTER — Other Ambulatory Visit: Payer: Self-pay

## 2019-09-03 DIAGNOSIS — F411 Generalized anxiety disorder: Secondary | ICD-10-CM

## 2019-09-03 DIAGNOSIS — F22 Delusional disorders: Secondary | ICD-10-CM | POA: Diagnosis not present

## 2019-09-03 DIAGNOSIS — F3341 Major depressive disorder, recurrent, in partial remission: Secondary | ICD-10-CM | POA: Diagnosis not present

## 2019-09-05 NOTE — Progress Notes (Signed)
Virtual Visit via Video Note  I connected with [name] on 09/05/19 at  3:00 PM EDT by a video enabled telemedicine application and verified that I am speaking with the correct person using two identifiers.  The patient was advised to call back or seek an in-person evaluation if the symptoms worsen or if the condition fails to improve as anticipated.  I provided 30 minutes of non-face-to-face time during this encounter.   [name], LCSW    THERAPIST PROGRESS NOTE  Session Time: 3pm-3:30pm  Participation Level: Active  Behavioral Response: Well GroomedAlertEuthymic  Type of Therapy: Individual Therapy  Treatment Goals addressed: Coping  Interventions: CBT, Motivational Interviewing and Supportive  Summary: Client presented alert and oriented for scheduled session. Client checked in and provided an update reporting that she has met with the psychiatrist as recommended and was prescribed a new medication however doesn't feel that she has taken it long enough for any noticeable change. Client reported that she feels her anxiety has overall decreased however she still experiences intense paranoia specifically related to technology when she does feel anxious. Client described means in which she attempts to challenge those thoughts with rational and positive self talk. Client agreed to review psycho-educational material and attempt identified skill. Client did not endorse SI/HI during this engagement.   Suicidal/Homicidal: Nowithout intent/plan  Therapist Response: Clinician met with client for scheduled session and facilitated check in assessing recent stressors, changes in mood, and SI/HI. Clinician inquired on any changes in paranoia since we last spoke and prompted further discussion on triggers and methods in which client copes with these thoughts. Clinician validated feelings and praised client's attempts to challenge these thoughts with self-talk. Clinician discussed  grounding technique utilizing the 5 senses and agreed to send resources on this skill to client following this session. Clinician encouraged client to attempt utilizing this skill prior to next session and will follow up in 3 weeks.  Plan: Return again in 3 weeks.  Diagnosis: Axis I: MDD, GAD, Paranoia       [name], LCSW 09/05/2019

## 2019-09-13 ENCOUNTER — Other Ambulatory Visit: Payer: Self-pay | Admitting: Family Medicine

## 2019-09-13 DIAGNOSIS — G8929 Other chronic pain: Secondary | ICD-10-CM

## 2019-09-16 NOTE — Telephone Encounter (Signed)
Requesting refill Gabapentin 100 MG

## 2019-09-23 ENCOUNTER — Encounter: Payer: Self-pay | Admitting: Psychiatry

## 2019-09-23 ENCOUNTER — Other Ambulatory Visit: Payer: Self-pay

## 2019-09-23 ENCOUNTER — Telehealth (INDEPENDENT_AMBULATORY_CARE_PROVIDER_SITE_OTHER): Payer: 59 | Admitting: Psychiatry

## 2019-09-23 DIAGNOSIS — F3341 Major depressive disorder, recurrent, in partial remission: Secondary | ICD-10-CM

## 2019-09-23 DIAGNOSIS — F411 Generalized anxiety disorder: Secondary | ICD-10-CM | POA: Diagnosis not present

## 2019-09-23 MED ORDER — SERTRALINE HCL 100 MG PO TABS: 100.0000 mg | ORAL_TABLET | Freq: Every day | ORAL | 2 refills | Status: DC

## 2019-09-23 NOTE — Progress Notes (Signed)
BH MD/PA/NP OP Progress Note  I connected with  Erin Zamora on 09/23/19 by a video enabled telemedicine application and verified that I am speaking with the correct person using two identifiers.   I discussed the limitations of evaluation and management by telemedicine. The patient expressed understanding and agreed to proceed.    09/23/2019 10:34 AM Erin Zamora  MRN:  [id number]  Chief Complaint: " I have been feeling anxious and tearful for past couple of weeks."   HPI: Patient reported that she has been managing fairly well at work despite the work picking up over the last week. She informed that 2 of her colleagues called out sick today so she is expecting a hectic schedule. She informed that her older daughter's bio father passed away a few weeks ago and that has been overwhelming for her daughter. She is trying her best to support her daughter. She reported that her family has been asking if she has been taking her medication regularly as for past 2 weeks she is easily tearful and emotional. She also has been feeling more anxious than usual. She asked if her dose could be increased further as she feels she can use increase in her dose.  She informed that she takes zoloft at bedtime as it makes her slightly sleepy if she takes it in the morning. Her paranoid ideations of her phone conversations being recorded has improved.   Visit Diagnosis:    ICD-10-CM   1. Recurrent major depressive disorder, in partial remission (HCC)  F33.41 sertraline (ZOLOFT) 100 MG tablet  2. Generalized anxiety disorder  F41.1 sertraline (ZOLOFT) 100 MG tablet    Past Psychiatric History: MDD, anxiety  Past Medical History:  Past Medical History:  Diagnosis Date  . Allergy    seasonal  . Anemia   . Anxiety   . CAP (community acquired pneumonia) 10/2018  . Cough 10/2018  . Hyperlipidemia 10/2018  . Insomnia 10/2018  . Microcytic anemia   . Thrombocytosis ([place])   . Vitamin D  deficiency 10/2018    Past Surgical History:  Procedure Laterality Date  . APPENDECTOMY  1991  . CHOLECYSTECTOMY  2003  . TUBAL LIGATION  2003    Family Psychiatric History: see below  Family History:  Family History  Problem Relation Age of Onset  . Alcohol abuse Mother   . COPD Mother   . Hypertension Mother   . Alcohol abuse Father   . Diabetes Father   . Drug abuse Father   . Heart disease Father   . Hypertension Father   . Kidney disease Father   . Arthritis Maternal Grandmother   . Arthritis Maternal Grandfather   . Cancer Paternal Grandmother   . Diabetes Paternal Grandmother   . Heart disease Paternal Grandmother   . Diabetes Paternal Grandfather   . Heart disease Paternal Grandfather   . Kidney disease Paternal Grandfather   . Stroke Paternal Grandfather     Social History:  Social History   Socioeconomic History  . Marital status: Single    Spouse name: Not on file  . Number of children: Not on file  . Years of education: Not on file  . Highest education level: Not on file  Occupational History  . Not on file  Tobacco Use  . Smoking status: Former Smoker    Packs/day: 0.25    Types: Cigarettes    Quit date: 10/01/2018    Years since quitting: 0.9  . Smokeless tobacco: Never Used  .  Tobacco comment: 5-7 ciggs per day  Substance and Sexual Activity  . Alcohol use: Yes    Alcohol/week: 0.0 standard drinks    Comment: rare  . Drug use: No  . Sexual activity: Yes    Partners: Male    Birth control/protection: None  Other Topics Concern  . Not on file  Social History Narrative   48 year old and 38 y/o daughters   63 year old granddaughter - takes care of her in the afternoon after school   Significant other - 15 years   Work: Web designer   Social Determinants of Radio broadcast assistant Strain:   . Difficulty of Paying Living Expenses:   Food Insecurity:   . Worried About Charity fundraiser in the Last Year:   . Academic librarian in the Last Year:   Transportation Needs:   . Film/video editor (Medical):   Marland Kitchen Lack of Transportation (Non-Medical):   Physical Activity:   . Days of Exercise per Week:   . Minutes of Exercise per Session:   Stress:   . Feeling of Stress :   Social Connections:   . Frequency of Communication with Friends and Family:   . Frequency of Social Gatherings with Friends and Family:   . Attends Religious Services:   . Active Member of Clubs or Organizations:   . Attends Archivist Meetings:   Marland Kitchen Marital Status:     Allergies:  Allergies  Allergen Reactions  . Codeine Itching and Nausea And Vomiting    Metabolic Disorder Labs: Lab Results  Component Value Date   HGBA1C 5.6 10/23/2018   No results found for: PROLACTIN Lab Results  Component Value Date   CHOL 168 10/23/2018   TRIG 228 (H) 10/23/2018   HDL 42 10/23/2018   CHOLHDL 4.0 10/23/2018   [place] 80 10/23/2018   Lab Results  Component Value Date   TSH 0.961 10/14/2018    Therapeutic Level Labs: No results found for: LITHIUM No results found for: VALPROATE No components found for:  CBMZ  Current Medications: Current Outpatient Medications  Medication Sig Dispense Refill  . gabapentin (NEURONTIN) 100 MG capsule TAKE 1 CAPSULE BY MOUTH THREE TIMES DAILY 90 capsule 6  . Glycopyrrolate-Formoterol (BEVESPI AEROSPHERE) 9-4.8 MCG/ACT AERO Inhale 2 puffs into the lungs 2 (two) times a day. 10.7 g 5  . ibuprofen (ADVIL) 800 MG tablet TAKE 1 TABLET BY MOUTH EVERY 8 HOURS AS NEEDED FOR MODERATE PAIN 30 tablet 0  . sertraline (ZOLOFT) 100 MG tablet Take 1 tablet (100 mg total) by mouth daily. 30 tablet 2  . Vitamin D, Ergocalciferol, (DRISDOL) 1.25 MG (50000 UT) CAPS capsule Take 1 capsule (50,000 Units total) by mouth every 7 (seven) days. 5 capsule 6   No current facility-administered medications for this visit.     Psychiatric Specialty Exam: Review of Systems  There were no vitals taken for this  visit.There is no height or weight on file to calculate BMI.  General Appearance: Well Groomed  Eye Contact:  Good  Speech:  Clear and Coherent and Normal Rate  Volume:  Normal  Mood:  Less depressed and less anxious  Affect:  Congruent  Thought Process:  Goal Directed, Linear and Descriptions of Associations: Intact  Orientation:  Full (Time, Place, and Person)  Thought Content: Logical   Suicidal Thoughts:  No  Homicidal Thoughts:  No  Memory:  Recent;   Good Remote;   Good  Judgement:  Good  Insight:  Good  Psychomotor Activity:  Normal  Concentration:  Concentration: Good and Attention Span: Good  Recall:  Good  Fund of Knowledge: Good  Language: Good  Akathisia:  Negative  Handed:  Right  AIMS (if indicated): not done  Assets:  Communication Skills Desire for Improvement Financial Resources/Insurance Housing  ADL's:  Intact  Cognition: WNL  Sleep:  Fair     Screenings: PHQ2-9     Office Visit from 01/01/2019 in [place] Office Visit from 11/20/2018 in [place] Office Visit from 10/23/2018 in [place] Office Visit from 12/22/2015 in Primary Care at [place] from 06/08/2015 in Primary Care at [facility] Total Score  0  0  0  0  0       Assessment and Plan: Pt reported increased tearfulness and anxiety over past couple of weeks and requested dose adjustment for optimal control of symptoms.  1. Recurrent major depressive disorder, in partial remission (HCC)  - Increase sertraline (ZOLOFT) 100 MG tablet; Take 1 tablet (100 mg total) by mouth daily.  Dispense: 30 tablet; Refill: 2  2. Generalized anxiety disorder  - Increase sertraline (ZOLOFT) 100 MG tablet; Take 1 tablet (100 mg total) by mouth daily.  Dispense: 30 tablet; Refill: 2   Follow-up in 8 weeks. Continue individual therapy with Ms. Lovena Le next week.    [place] Crane, MD 09/23/2019, 10:34 AM

## 2019-09-24 ENCOUNTER — Ambulatory Visit (INDEPENDENT_AMBULATORY_CARE_PROVIDER_SITE_OTHER): Payer: 59 | Admitting: Licensed Clinical Social Worker

## 2019-09-24 DIAGNOSIS — F411 Generalized anxiety disorder: Secondary | ICD-10-CM | POA: Diagnosis not present

## 2019-09-24 DIAGNOSIS — F22 Delusional disorders: Secondary | ICD-10-CM

## 2019-09-24 DIAGNOSIS — F3341 Major depressive disorder, recurrent, in partial remission: Secondary | ICD-10-CM | POA: Diagnosis not present

## 2019-09-25 NOTE — Progress Notes (Signed)
Virtual Visit via Telephone Note  I connected with [name] on 09/25/19 at  1:00 PM EDT by telephone and verified that I am speaking with the correct person using two identifiers.  The patient was advised to call back or seek an in-person evaluation if the symptoms worsen or if the condition fails to improve as anticipated.  Location: Patient: Pt home Provider: [place]  I provided 30 minutes of non-face-to-face time during this encounter.   [name], LCSW    THERAPIST PROGRESS NOTE  Session Time: 1pm-1:30pm  Participation Level: Active  Behavioral Response: NeatAlertAnxious  Type of Therapy: Individual Therapy  Treatment Goals addressed: Coping  Interventions: CBT, Motivational Interviewing and Supportive  Summary: Client presented alert and oriented for scheduled session. Client checked in and provided an update reporting multiple recent stressors including the death of her daughter's father as well as increased work load since last session. Client identified and processed feelings related to this and further discussed reported decrease of paranoia. Client reported that she has reflected over potential root of paranoia and believes that this stems from "my need of control" and also identified difficulty maintaining boundaries with others which "forces me to have to be in control of things". Client discussed various coping skills that she has attempted to utilize including 5 senses grounding technique. Client actively engaged in discussion on cognitive distortions and means of challenging those thoughts. Client did not endorse SI/HI/psychosis during this engagement.  Suicidal/Homicidal: Nowithout intent/plan  Therapist Response: Clinician joined client for scheduled session and facilitated check in assessing recent stressors, changes in mood, and SI/HI/psychosis. Clinician engaged in discussion on identified stressors and allowed space for processing of  feelings. Clinician utilized MI, OARS to assess coping skills utilized and offered supportive statements throughout session. Clinician and client discussed boundaries and what it looks like when enforcing them. Clinician utilized CBT to challenge distorted thought processes and agreed to send thought record via email encouraging clt to utilize this prior to next session.  Plan: Return again in around 3 weeks.  Diagnosis: Axis I:        [name], LCSW 09/25/2019

## 2019-10-01 ENCOUNTER — Telehealth (HOSPITAL_COMMUNITY): Payer: Self-pay | Admitting: Licensed Clinical Social Worker

## 2019-10-01 NOTE — Telephone Encounter (Signed)
Spoke w/ pt to inform her of this clinician's resignation from this clinic effective 10/18/19. Pt prefers to transition to available therapist within this clinic. Member of front desk staff will contact pt to schedule appt.

## 2019-10-15 ENCOUNTER — Ambulatory Visit (INDEPENDENT_AMBULATORY_CARE_PROVIDER_SITE_OTHER): Payer: 59 | Admitting: Licensed Clinical Social Worker

## 2019-10-15 ENCOUNTER — Other Ambulatory Visit: Payer: Self-pay

## 2019-10-15 DIAGNOSIS — F3341 Major depressive disorder, recurrent, in partial remission: Secondary | ICD-10-CM | POA: Diagnosis not present

## 2019-10-17 NOTE — Progress Notes (Signed)
Virtual Visit via Telephone Note  I connected with [name] on 10/15/19 at  3:00 PM EDT by telephone and verified that I am speaking with the correct person using two identifiers.   I discussed the limitations, risks, security and privacy concerns of performing an evaluation and management service by telephone and the availability of in person appointments. I also discussed with the patient that there may be a patient responsible charge related to this service. The patient expressed understanding and agreed to proceed.  The patient was advised to call back or seek an in-person evaluation if the symptoms worsen or if the condition fails to improve as anticipated.  Location: Patient: Pt Home Therapist: [place]  I provided 30 minutes of non-face-to-face time during this encounter.   [name], LCSW    THERAPIST PROGRESS NOTE  Session Time: 2pm-2:30pm  Participation Level: Active  Behavioral Response: NeatAlertEuthymic  Type of Therapy: Individual Therapy  Treatment Goals addressed: Coping  Interventions: CBT, Motivational Interviewing and Supportive  Summary: Clt presented alert and oriented for scheduled therapy session. Clt engaged in check in reporting she has noticed decreased anxiety/panic since last session. Clt provided examples in which she has felt anxious and was intentional in identifying and challenging her automatic thought related to the stress. Clt engaged in further discussion on her work towards improving boundaries with those in her household and processed irritability that comes when she feels those boundaries aren't acknowledged or accepted. Clt discussed transition plan and agrees to continue medication management with Dr. Toy Care and will follow up with Clinician B. Morris for continued therapy services. Clt denied SI/HI/psychosis during this engagement.  Suicidal/Homicidal: Nowithout intent/plan  Therapist Response: Clinician joined clt for  scheduled session and facilitated check in assessing recent stressors, changes in mood, and SI/HI/psychosis. Clinician engaged in discussion on identified topic in check in and praised clt for increased awareness into distorted thought processes and her attempts to challenge them. Clinician provided further psycho-education on boundaries and utilized supportive statements to empower clt in continuing to implement healthy boundaries.   Plan: Clt to continue medication management with Dr. Toy Care and will transition to seeing clinician B. Morris for therapy moving forward.  Diagnosis: Axis I: MDD in partial remission      [name], LCSW 10/17/2019

## 2019-10-30 ENCOUNTER — Ambulatory Visit (INDEPENDENT_AMBULATORY_CARE_PROVIDER_SITE_OTHER): Payer: 59 | Admitting: Psychiatry

## 2019-10-30 ENCOUNTER — Other Ambulatory Visit: Payer: Self-pay

## 2019-10-30 ENCOUNTER — Encounter (HOSPITAL_COMMUNITY): Payer: Self-pay | Admitting: Psychiatry

## 2019-10-30 DIAGNOSIS — F3341 Major depressive disorder, recurrent, in partial remission: Secondary | ICD-10-CM | POA: Diagnosis not present

## 2019-10-30 DIAGNOSIS — F22 Delusional disorders: Secondary | ICD-10-CM | POA: Diagnosis not present

## 2019-10-30 DIAGNOSIS — F411 Generalized anxiety disorder: Secondary | ICD-10-CM

## 2019-10-30 NOTE — Progress Notes (Signed)
Virtual Visit via Video Note  I connected with [name] on 10/30/19 at  3:30 PM EDT by a video enabled telemedicine application and verified that I am speaking with the correct person using two identifiers.  Location: Patient: Patient Home Provider: Home Office   I discussed the limitations of evaluation and management by telemedicine and the availability of in person appointments. The patient expressed understanding and agreed to proceed.  History of Present Illness: MDD, GAD, Paranoia   Treatment Plan Goals: 1) Patton would like to reduce the impact of work-related stress by improving boundary setting, communicating needs and applying coping strategies during work hours.  2) Gabrielly would like to reduce paranoia, depressive/anxiety symptoms by addressing cognitive distortions through learning and applying CBT skills/interventions.  3) Samreet feels disconnected from self and would like to explore her identity, interest and preferences to create a better work-life balance to alleviate stress and anxiety.  Observations/Objective: Counselor met with Client for individual therapy via Webex. Counselor assessed MH symptoms and progress on treatment plan goals, with patient reporting that her anxiety has steadily decreased over the past 3 months, becoming "more manageable". Client reporting no panic attacks at this time. Client reports being overworked, overwhelmed, tired and feels helpless as it relates to achieving a work-life balance. Client presents with moderate depression and moderate anxiety. Client denied suicidal ideation or self-harm behaviors.   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 Counselor assessed strategies learned, used and applied through work with former therapist. Client noted that she has tried boundary setting, but would like more information and application skills  in this area. Counselor explored communication style, relationship dynamics, history of mental health condition, and coping strategies. Counselor and Client reviewed and update treatment plan goals (see above) to address targeted areas of challenge and concern for her. Client stated that she likes receiving homework to review and implement between sessions. Client would like to meet one to two times a month. Counselor to set upcoming appointments and send homework related to identity, cognitive distortions and work-related stress.   Assessment and Plan: Counselor will continue to meet with patient to address treatment plan goals. Patient will continue to follow recommendations of providers and implement skills learned in session.  Follow Up Instructions: Counselor will send information for next session via Webex.    The patient was advised to call back or seek an in-person evaluation if the symptoms worsen or if the condition fails to improve as anticipated.  I provided 55 minutes of non-face-to-face time during this encounter.   Lise Auer, LCSW

## 2019-10-31 DIAGNOSIS — L989 Disorder of the skin and subcutaneous tissue, unspecified: Secondary | ICD-10-CM

## 2019-10-31 HISTORY — DX: Disorder of the skin and subcutaneous tissue, unspecified: L98.9

## 2019-11-21 ENCOUNTER — Telehealth (INDEPENDENT_AMBULATORY_CARE_PROVIDER_SITE_OTHER): Payer: 59 | Admitting: Psychiatry

## 2019-11-21 ENCOUNTER — Encounter (HOSPITAL_COMMUNITY): Payer: Self-pay | Admitting: Psychiatry

## 2019-11-21 ENCOUNTER — Other Ambulatory Visit: Payer: Self-pay

## 2019-11-21 DIAGNOSIS — F411 Generalized anxiety disorder: Secondary | ICD-10-CM | POA: Diagnosis not present

## 2019-11-21 DIAGNOSIS — F3341 Major depressive disorder, recurrent, in partial remission: Secondary | ICD-10-CM

## 2019-11-21 MED ORDER — SERTRALINE HCL 100 MG PO TABS: 100.0000 mg | ORAL_TABLET | Freq: Every day | ORAL | 2 refills | Status: DC

## 2019-11-21 NOTE — Progress Notes (Signed)
BH MD/PA/NP OP Progress Note  Virtual Visit via Video Note  I connected with Erin Zamora on 11/21/19 at  8:30 AM EDT by a video enabled telemedicine application and verified that I am speaking with the correct person using two identifiers.  Location: Patient: Home Provider: Clinic   I discussed the limitations of evaluation and management by telemedicine and the availability of in person appointments. The patient expressed understanding and agreed to proceed.  I provided 13 minutes of non-face-to-face time during this encounter.      11/21/2019 8:35 AM Erin Zamora  MRN:  [id number]  Chief Complaint: " I am doing okay."   HPI: Patient reported that she is doing fairly better compared to the past.  She informed that her crying spells and mood are improved.  She was noted to be very busy at work and was multitasking during the appointment.  She stated that she has a new therapist and she is working on identifying her triggers with her.  She wants to continue the same regimen for now and denied any other acute issues.   Visit Diagnosis:    ICD-10-CM   1. Recurrent major depressive disorder, in partial remission ([place])  F33.41   2. Generalized anxiety disorder  F41.1     Past Psychiatric History: MDD, anxiety  Past Medical History:  Past Medical History:  Diagnosis Date  . Allergy    seasonal  . Anemia   . Anxiety   . CAP (community acquired pneumonia) 10/2018  . Cough 10/2018  . Hyperlipidemia 10/2018  . Insomnia 10/2018  . Microcytic anemia   . Thrombocytosis ([place])   . Vitamin D deficiency 10/2018    Past Surgical History:  Procedure Laterality Date  . APPENDECTOMY  1991  . CHOLECYSTECTOMY  2003  . TUBAL LIGATION  2003    Family Psychiatric History: see below  Family History:  Family History  Problem Relation Age of Onset  . Alcohol abuse Mother   . COPD Mother   . Hypertension Mother   . Alcohol abuse Father   . Diabetes Father   . Drug  abuse Father   . Heart disease Father   . Hypertension Father   . Kidney disease Father   . Arthritis Maternal Grandmother   . Arthritis Maternal Grandfather   . Cancer Paternal Grandmother   . Diabetes Paternal Grandmother   . Heart disease Paternal Grandmother   . Diabetes Paternal Grandfather   . Heart disease Paternal Grandfather   . Kidney disease Paternal Grandfather   . Stroke Paternal Grandfather     Social History:  Social History   Socioeconomic History  . Marital status: Single    Spouse name: Not on file  . Number of children: Not on file  . Years of education: Not on file  . Highest education level: Not on file  Occupational History  . Not on file  Tobacco Use  . Smoking status: Former Smoker    Packs/day: 0.25    Types: Cigarettes    Quit date: 10/01/2018    Years since quitting: 1.1  . Smokeless tobacco: Never Used  . Tobacco comment: 5-7 ciggs per day  Vaping Use  . Vaping Use: Never used  Substance and Sexual Activity  . Alcohol use: Yes    Alcohol/week: 0.0 standard drinks    Comment: rare  . Drug use: No  . Sexual activity: Yes    Partners: Male    Birth control/protection: None  Other Topics Concern  .  Not on file  Social History Narrative   48 year old and 71 y/o daughters   42 year old granddaughter - takes care of her in the afternoon after school   Significant other - 15 years   Work: Web designer   Social Determinants of Radio broadcast assistant Strain:   . Difficulty of Paying Living Expenses:   Food Insecurity:   . Worried About Charity fundraiser in the Last Year:   . Arboriculturist in the Last Year:   Transportation Needs:   . Film/video editor (Medical):   Marland Kitchen Lack of Transportation (Non-Medical):   Physical Activity:   . Days of Exercise per Week:   . Minutes of Exercise per Session:   Stress:   . Feeling of Stress :   Social Connections:   . Frequency of Communication with Friends and Family:   .  Frequency of Social Gatherings with Friends and Family:   . Attends Religious Services:   . Active Member of Clubs or Organizations:   . Attends Archivist Meetings:   Marland Kitchen Marital Status:     Allergies:  Allergies  Allergen Reactions  . Codeine Itching and Nausea And Vomiting    Metabolic Disorder Labs: Lab Results  Component Value Date   HGBA1C 5.6 10/23/2018   No results found for: PROLACTIN Lab Results  Component Value Date   CHOL 168 10/23/2018   TRIG 228 (H) 10/23/2018   HDL 42 10/23/2018   CHOLHDL 4.0 10/23/2018   [place] 80 10/23/2018   Lab Results  Component Value Date   TSH 0.961 10/14/2018    Therapeutic Level Labs: No results found for: LITHIUM No results found for: VALPROATE No components found for:  CBMZ  Current Medications: Current Outpatient Medications  Medication Sig Dispense Refill  . gabapentin (NEURONTIN) 100 MG capsule TAKE 1 CAPSULE BY MOUTH THREE TIMES DAILY 90 capsule 6  . Glycopyrrolate-Formoterol (BEVESPI AEROSPHERE) 9-4.8 MCG/ACT AERO Inhale 2 puffs into the lungs 2 (two) times a day. 10.7 g 5  . ibuprofen (ADVIL) 800 MG tablet TAKE 1 TABLET BY MOUTH EVERY 8 HOURS AS NEEDED FOR MODERATE PAIN 30 tablet 0  . sertraline (ZOLOFT) 100 MG tablet Take 1 tablet (100 mg total) by mouth daily. 30 tablet 2  . Vitamin D, Ergocalciferol, (DRISDOL) 1.25 MG (50000 UT) CAPS capsule Take 1 capsule (50,000 Units total) by mouth every 7 (seven) days. 5 capsule 6   No current facility-administered medications for this visit.     Psychiatric Specialty Exam: Review of Systems  There were no vitals taken for this visit.There is no height or weight on file to calculate BMI.  General Appearance: Well Groomed  Eye Contact:  Good  Speech:  Clear and Coherent and Normal Rate  Volume:  Normal  Mood:  Less depressed and less anxious  Affect:  Congruent  Thought Process:  Goal Directed, Linear and Descriptions of Associations: Intact  Orientation:   Full (Time, Place, and Person)  Thought Content: Logical   Suicidal Thoughts:  No  Homicidal Thoughts:  No  Memory:  Recent;   Good Remote;   Good  Judgement:  Good  Insight:  Good  Psychomotor Activity:  Normal  Concentration:  Concentration: Good and Attention Span: Good  Recall:  Good  Fund of Knowledge: Good  Language: Good  Akathisia:  Negative  Handed:  Right  AIMS (if indicated): not done  Assets:  Communication Skills Desire for Improvement Financial Resources/Insurance  Housing  ADL's:  Intact  Cognition: WNL  Sleep:  Fair     Screenings: PHQ2-9     Office Visit from 01/01/2019 in [place] Office Visit from 11/20/2018 in [place] Office Visit from 10/23/2018 in [place] Office Visit from 12/22/2015 in Primary Care at [place] from 06/08/2015 in [facility] Care at [facility] Total Score 0 0 0 0 0       Assessment and Plan: Pt patient remains busy with her work schedule, feels the current dose is working fine and would like to continue the same.  1. Recurrent major depressive disorder, in partial remission ([facility])  - Sertraline (ZOLOFT) 100 MG tablet; Take 1 tablet (100 mg total) by mouth daily.  Dispense: 30 tablet; Refill: 2  2. Generalized anxiety disorder  - Sertraline (ZOLOFT) 100 MG tablet; Take 1 tablet (100 mg total) by mouth daily.  Dispense: 30 tablet; Refill: 2   Follow-up in 10 weeks. Continue individual therapy.    Nevada Crane, MD 11/21/2019, 8:35 AM

## 2019-11-22 ENCOUNTER — Other Ambulatory Visit: Payer: Self-pay

## 2019-11-22 ENCOUNTER — Ambulatory Visit (INDEPENDENT_AMBULATORY_CARE_PROVIDER_SITE_OTHER): Payer: 59 | Admitting: Family Medicine

## 2019-11-22 ENCOUNTER — Encounter: Payer: Self-pay | Admitting: Family Medicine

## 2019-11-22 VITALS — BP 136/81 | HR 77 | Temp 99.7°F | Resp 16 | Ht 65.0 in | Wt 218.2 lb

## 2019-11-22 DIAGNOSIS — Z09 Encounter for follow-up examination after completed treatment for conditions other than malignant neoplasm: Secondary | ICD-10-CM

## 2019-11-22 DIAGNOSIS — L989 Disorder of the skin and subcutaneous tissue, unspecified: Secondary | ICD-10-CM | POA: Diagnosis not present

## 2019-11-22 DIAGNOSIS — R200 Anesthesia of skin: Secondary | ICD-10-CM

## 2019-11-22 DIAGNOSIS — M5442 Lumbago with sciatica, left side: Secondary | ICD-10-CM

## 2019-11-22 DIAGNOSIS — F419 Anxiety disorder, unspecified: Secondary | ICD-10-CM | POA: Diagnosis not present

## 2019-11-22 DIAGNOSIS — R202 Paresthesia of skin: Secondary | ICD-10-CM

## 2019-11-22 DIAGNOSIS — R0789 Other chest pain: Secondary | ICD-10-CM

## 2019-11-22 DIAGNOSIS — Z0000 Encounter for general adult medical examination without abnormal findings: Secondary | ICD-10-CM | POA: Diagnosis not present

## 2019-11-22 DIAGNOSIS — G8929 Other chronic pain: Secondary | ICD-10-CM

## 2019-11-22 LAB — POCT URINALYSIS DIPSTICK
Bilirubin, UA: NEGATIVE
Glucose, UA: NEGATIVE
Ketones, UA: NEGATIVE
Leukocytes, UA: NEGATIVE
Nitrite, UA: NEGATIVE
Protein, UA: NEGATIVE
Spec Grav, UA: >=1.03 — AB (ref 1.010–1.025)
Urobilinogen, UA: 1 E.U./dL
pH, UA: 6.5 (ref 5.0–8.0)

## 2019-11-22 LAB — POCT GLYCOSYLATED HEMOGLOBIN (HGB A1C)
HbA1c POC (<> result, manual entry): 5.9 % (ref 4.0–5.6)
HbA1c, POC (controlled diabetic range): 5.9 % (ref 0.0–7.0)
HbA1c, POC (prediabetic range): 5.9 % (ref 5.7–6.4)
Hemoglobin A1C: 5.9 % — AB (ref 4.0–5.6)

## 2019-11-22 LAB — GLUCOSE, POCT (MANUAL RESULT ENTRY): POC Glucose: 90 mg/dl (ref 70–99)

## 2019-11-22 MED ORDER — GABAPENTIN 300 MG PO CAPS: 300.0000 mg | ORAL_CAPSULE | Freq: Three times a day (TID) | ORAL | 3 refills | Status: DC

## 2019-11-22 NOTE — Progress Notes (Signed)
[name]   Established Patient Office Visit  Subjective:  Patient ID: Erin Zamora, female    DOB: 1971/04/01  Age: 49 y.o. MRN: [id number]  CC:  Chief Complaint  Patient presents with  . Follow-up    Pt states she is here for a f/u and to talk about numbing in her legs and consistent pain in her back. Pt states sometime pain level can be a 10.     HPI Erin Zamora is a 49 year old female who presents for follow Up today.  Past Medical History:  Diagnosis Date  . Allergy    seasonal  . Anemia   . Anxiety   . CAP (community acquired pneumonia) 10/2018  . Cough 10/2018  . Hyperlipidemia 10/2018  . Insomnia 10/2018  . Microcytic anemia   . Thrombocytosis ([place])   . Vitamin D deficiency 10/2018    Past Surgical History:  Procedure Laterality Date  . APPENDECTOMY  1991  . CHOLECYSTECTOMY  2003  . TUBAL LIGATION  2003    Family History  Problem Relation Age of Onset  . Alcohol abuse Mother   . COPD Mother   . Hypertension Mother   . Alcohol abuse Father   . Diabetes Father   . Drug abuse Father   . Heart disease Father   . Hypertension Father   . Kidney disease Father   . Arthritis Maternal Grandmother   . Arthritis Maternal Grandfather   . Cancer Paternal Grandmother   . Diabetes Paternal Grandmother   . Heart disease Paternal Grandmother   . Diabetes Paternal Grandfather   . Heart disease Paternal Grandfather   . Kidney disease Paternal Grandfather   . Stroke Paternal Grandfather    Current Status: Since last office visit, she has increasing lower back and hip pain. It is getting hard for her to work without pain. She states that heating pad is only relief on her pain.  She recently noticed new 'skin lesions' over skin lately. She continues to follow up with Psychiatry as needed. She denies suicidal ideations, homicidal ideations, or auditory hallucinations. She has follow up with  Oncologist for Iron Deficiency Anemia on next week. She has intermittent chest pain. Denies heart palpitations, cough and shortness of breath reported. She denies fevers, chills, fatigue, recent infections, weight loss, and night sweats. She has not had any headaches, visual changes, dizziness, and falls. Denies GI problems such as nausea, vomiting, diarrhea, and constipation. She has no reports of blood in stools, dysuria and hematuria. She is taking all medications as prescribed.  Social History   Socioeconomic History  . Marital status: Single    Spouse name: Not on file  . Number of children: Not on file  . Years of education: Not on file  . Highest education level: Not on file  Occupational History  . Not on file  Tobacco Use  . Smoking status: Former Smoker    Packs/day: 0.25    Types: Cigarettes    Quit date: 10/01/2018    Years since quitting: 1.1  . Smokeless tobacco: Never Used  . Tobacco comment: 5-7 ciggs per day  Vaping Use  . Vaping Use: Never used  Substance and Sexual Activity  . Alcohol use: Yes    Alcohol/week: 0.0 standard drinks    Comment: rare  . Drug use: No  . Sexual activity: Yes    Partners: Male    Birth control/protection: None  Other Topics  Concern  . Not on file  Social History Narrative   48 year old and 68 y/o daughters   39 year old granddaughter - takes care of her in the afternoon after school   Significant other - 15 years   Work: Web designer   Social Determinants of Radio broadcast assistant Strain:   . Difficulty of Paying Living Expenses:   Food Insecurity:   . Worried About Charity fundraiser in the Last Year:   . Arboriculturist in the Last Year:   Transportation Needs:   . Film/video editor (Medical):   Marland Kitchen Lack of Transportation (Non-Medical):   Physical Activity:   . Days of Exercise per Week:   . Minutes of Exercise per Session:   Stress:   . Feeling of Stress :   Social Connections:   . Frequency of  Communication with Friends and Family:   . Frequency of Social Gatherings with Friends and Family:   . Attends Religious Services:   . Active Member of Clubs or Organizations:   . Attends Archivist Meetings:   Marland Kitchen Marital Status:   Intimate Partner Violence:   . Fear of Current or Ex-Partner:   . Emotionally Abused:   Marland Kitchen Physically Abused:   . Sexually Abused:     Outpatient Medications Prior to Visit  Medication Sig Dispense Refill  . ibuprofen (ADVIL) 800 MG tablet TAKE 1 TABLET BY MOUTH EVERY 8 HOURS AS NEEDED FOR MODERATE PAIN 30 tablet 0  . sertraline (ZOLOFT) 100 MG tablet Take 1 tablet (100 mg total) by mouth daily. 30 tablet 2  . Vitamin D, Ergocalciferol, (DRISDOL) 1.25 MG (50000 UT) CAPS capsule Take 1 capsule (50,000 Units total) by mouth every 7 (seven) days. 5 capsule 6  . gabapentin (NEURONTIN) 100 MG capsule TAKE 1 CAPSULE BY MOUTH THREE TIMES DAILY 90 capsule 6  . Glycopyrrolate-Formoterol (BEVESPI AEROSPHERE) 9-4.8 MCG/ACT AERO Inhale 2 puffs into the lungs 2 (two) times a day. (Patient not taking: Reported on 11/22/2019) 10.7 g 5   No facility-administered medications prior to visit.    Allergies  Allergen Reactions  . Codeine Itching and Nausea And Vomiting    ROS Review of Systems  Constitutional: Negative.   HENT: Negative.   Eyes: Negative.   Respiratory: Negative.   Cardiovascular: Positive for chest pain (occasional ).  Gastrointestinal: Positive for abdominal distention.  Endocrine: Negative.   Genitourinary: Negative.   Musculoskeletal: Positive for arthralgias (generalized joint pain) and back pain (chronic back/hip pain).  Skin: Negative.   Allergic/Immunologic: Negative.   Neurological: Positive for dizziness (occasional), weakness (generalized. ) and headaches (occasional ).  Hematological: Negative.   Psychiatric/Behavioral: Negative.       Objective:    Physical Exam Vitals and nursing note reviewed.  Constitutional:       Appearance: Normal appearance.  HENT:     Head: Normocephalic and atraumatic.     Nose: Nose normal.     Mouth/Throat:     Mouth: Mucous membranes are dry.  Cardiovascular:     Rate and Rhythm: Normal rate and regular rhythm.     Pulses: Normal pulses.     Heart sounds: Normal heart sounds.  Pulmonary:     Effort: Pulmonary effort is normal.     Breath sounds: Normal breath sounds.  Abdominal:     General: Bowel sounds are normal.     Palpations: Abdomen is soft.  Genitourinary:    General: Normal vulva.  Musculoskeletal:        General: Normal range of motion.     Cervical back: Normal range of motion and neck supple.  Skin:    General: Skin is warm and dry.     Comments: New skin tags  Neurological:     General: No focal deficit present.     Mental Status: She is alert and oriented to person, place, and time.  Psychiatric:        Mood and Affect: Mood normal.        Behavior: Behavior normal.        Thought Content: Thought content normal.     BP (!) 136/81 (BP Location: Right Arm, Patient Position: Sitting, Cuff Size: Large)   Pulse 77   Temp 99.7 F (37.6 C)   Resp 16   Ht 5\' 5"  (1.651 m)   Wt (!) 218 lb 3.2 oz (99 kg)   LMP 09/09/2019 (Approximate)   SpO2 96%   BMI 36.31 kg/m  Wt Readings from Last 3 Encounters:  11/29/19 (!) 235 lb 12.8 oz (107 kg)  11/22/19 (!) 218 lb 3.2 oz (99 kg)  08/30/19 233 lb 11.2 oz (106 kg)     Health Maintenance Due  Topic Date Due  . Hepatitis C Screening  Never done  . COVID-19 Vaccine (1) Never done  . PAP SMEAR-Modifier  Never done  . TETANUS/TDAP  05/03/2019  . INFLUENZA VACCINE  12/01/2019    There are no preventive care reminders to display for this patient.  Lab Results  Component Value Date   TSH 0.961 10/14/2018   Lab Results  Component Value Date   WBC 10.7 (H) 11/29/2019   HGB 11.8 (L) 11/29/2019   HCT 37.0 11/29/2019   MCV 81.5 11/29/2019   PLT 537 (H) 11/29/2019   Lab Results  Component Value  Date   NA 138 10/15/2018   K 4.1 10/15/2018   CO2 24 10/15/2018   GLUCOSE 128 (H) 10/15/2018   BUN 8 10/15/2018   CREATININE 0.76 10/15/2018   BILITOT 0.5 10/23/2018   ALKPHOS 66 10/23/2018   AST 13 10/23/2018   ALT 17 10/23/2018   PROT 6.8 10/23/2018   ALBUMIN 4.6 10/23/2018   CALCIUM 9.2 10/15/2018   ANIONGAP 9 10/15/2018   Lab Results  Component Value Date   CHOL 168 10/23/2018   Lab Results  Component Value Date   HDL 42 10/23/2018   Lab Results  Component Value Date   LDLCALC 80 10/23/2018   Lab Results  Component Value Date   TRIG 228 (H) 10/23/2018   Lab Results  Component Value Date   CHOLHDL 4.0 10/23/2018   Lab Results  Component Value Date   HGBA1C 5.9 (A) 11/22/2019   HGBA1C 5.9 11/22/2019   HGBA1C 5.9 11/22/2019   HGBA1C 5.9 11/22/2019      Assessment & Plan:   1. Chronic left-sided low back pain with left-sided sciatica - gabapentin (NEURONTIN) 300 MG capsule; Take 1 capsule (300 mg total) by mouth 3 (three) times daily.  Dispense: 90 capsule; Refill: 3 - CT Lumbar Spine Wo Contrast; Future  2. Numbness and tingling of left lower extremity - gabapentin (NEURONTIN) 300 MG capsule; Take 1 capsule (300 mg total) by mouth 3 (three) times daily.  Dispense: 90 capsule; Refill: 3 - CT Lumbar Spine Wo Contrast; Future  3. Skin lesions, generalized  4. Anxiety  5. Healthcare maintenance - POC Glucose (CBG) - POC HgB A1c - Urinalysis Dipstick  6.  Follow up He will follow up in 3 months.   Meds ordered this encounter  Medications  . gabapentin (NEURONTIN) 300 MG capsule    Sig: Take 1 capsule (300 mg total) by mouth 3 (three) times daily.    Dispense:  90 capsule    Refill:  3    Orders Placed This Encounter  Procedures  . CT Lumbar Spine Wo Contrast  . POC Glucose (CBG)  . POC HgB A1c  . Urinalysis Dipstick  . EKG    Referral Orders  No referral(s) requested today    Kathe Becton,  MSN, FNP-BC [place] [address], [place] [id number] [phone]- fax    Problem List Items Addressed This Visit      Nervous and Auditory   Chronic left-sided low back pain with left-sided sciatica - Primary   Relevant Medications   gabapentin (NEURONTIN) 300 MG capsule   Other Relevant Orders   CT Lumbar Spine Wo Contrast     Other   Anxiety   Numbness and tingling of left lower extremity   Relevant Medications   gabapentin (NEURONTIN) 300 MG capsule   Other Relevant Orders   CT Lumbar Spine Wo Contrast    Other Visit Diagnoses    Skin lesions, generalized       Healthcare maintenance       Relevant Orders   POC Glucose (CBG) (Completed)   POC HgB A1c (Completed)   Urinalysis Dipstick (Completed)   Follow up          Meds ordered this encounter  Medications  . gabapentin (NEURONTIN) 300 MG capsule    Sig: Take 1 capsule (300 mg total) by mouth 3 (three) times daily.    Dispense:  90 capsule    Refill:  3    Follow-up: Return in about 3 months (around 02/22/2020).    Azzie Glatter, FNP

## 2019-11-22 NOTE — Progress Notes (Signed)
poc

## 2019-11-25 NOTE — Progress Notes (Signed)
[facility]  [phone] [phone]  HEMATOLOGY-ONCOLOGY TELEMEDICINE VISIT PROGRESS NOTE  ID: Erin Zamora OB: 1970/05/21  MR#: [id number]  IDC#:[id number]  Patient Care Team: Azzie Glatter, FNP as PCP - General (Family Medicine) Lloyd Huger, MD as Consulting Physician (Oncology)   CHIEF COMPLAINT: Thrombocytosis, iron deficiency anemia.  INTERVAL HISTORY: Patient returns to clinic today for repeat laboratory work, further evaluation, and continuation of IV iron.  She continues to have chronic fatigue which she relates to her work schedule.  She otherwise feels well and is asymptomatic. She has no neurologic complaints.  She denies any recent fevers or illnesses.  She has a good appetite and denies weight loss.  She has no chest pain, shortness of breath, cough, or hemoptysis.  She denies any nausea, vomiting, constipation, or diarrhea.  She denies any melena or hematochezia.  She has no urinary complaints.  Patient offers no further specific complaints today.  REVIEW OF SYSTEMS:   Review of Systems  Constitutional: Positive for malaise/fatigue. Negative for fever and weight loss.  Respiratory: Negative.  Negative for cough, hemoptysis and shortness of breath.   Cardiovascular: Negative.  Negative for chest pain and leg swelling.  Gastrointestinal: Negative.  Negative for abdominal pain.  Genitourinary: Negative.  Negative for dysuria.  Musculoskeletal: Negative.  Negative for back pain.  Skin: Negative.  Negative for rash.  Neurological: Negative.  Negative for dizziness, focal weakness, weakness and headaches.  Psychiatric/Behavioral: Negative.  The patient is not nervous/anxious.     As per HPI. Otherwise, a complete review of systems is negative.  PAST MEDICAL HISTORY: Past Medical History:  Diagnosis Date  . Allergy    seasonal  . Anemia   . Anxiety   . CAP (community acquired pneumonia) 10/2018  . Cough 10/2018   . Hyperlipidemia 10/2018  . Insomnia 10/2018  . Microcytic anemia   . Thrombocytosis ([place])   . Vitamin D deficiency 10/2018    PAST SURGICAL HISTORY: Past Surgical History:  Procedure Laterality Date  . APPENDECTOMY  1991  . CHOLECYSTECTOMY  2003  . TUBAL LIGATION  2003    FAMILY HISTORY: Family History  Problem Relation Age of Onset  . Alcohol abuse Mother   . COPD Mother   . Hypertension Mother   . Alcohol abuse Father   . Diabetes Father   . Drug abuse Father   . Heart disease Father   . Hypertension Father   . Kidney disease Father   . Arthritis Maternal Grandmother   . Arthritis Maternal Grandfather   . Cancer Paternal Grandmother   . Diabetes Paternal Grandmother   . Heart disease Paternal Grandmother   . Diabetes Paternal Grandfather   . Heart disease Paternal Grandfather   . Kidney disease Paternal Grandfather   . Stroke Paternal Grandfather     ADVANCED DIRECTIVES (Y/N):  N  HEALTH MAINTENANCE: Social History   Tobacco Use  . Smoking status: Former Smoker    Packs/day: 0.25    Types: Cigarettes    Quit date: 10/01/2018    Years since quitting: 1.1  . Smokeless tobacco: Never Used  . Tobacco comment: 5-7 ciggs per day  Vaping Use  . Vaping Use: Never used  Substance Use Topics  . Alcohol use: Yes    Alcohol/week: 0.0 standard drinks    Comment: rare  . Drug use: No     Colonoscopy:  PAP:  Bone density:  Lipid panel:  Allergies  Allergen Reactions  . Codeine Itching  and Nausea And Vomiting    Current Outpatient Medications  Medication Sig Dispense Refill  . gabapentin (NEURONTIN) 300 MG capsule Take 1 capsule (300 mg total) by mouth 3 (three) times daily. 90 capsule 3  . ibuprofen (ADVIL) 800 MG tablet TAKE 1 TABLET BY MOUTH EVERY 8 HOURS AS NEEDED FOR MODERATE PAIN 30 tablet 0  . sertraline (ZOLOFT) 100 MG tablet Take 1 tablet (100 mg total) by mouth daily. 30 tablet 2  . Vitamin D, Ergocalciferol, (DRISDOL) 1.25 MG (50000 UT) CAPS  capsule Take 1 capsule (50,000 Units total) by mouth every 7 (seven) days. 5 capsule 6  . Glycopyrrolate-Formoterol (BEVESPI AEROSPHERE) 9-4.8 MCG/ACT AERO Inhale 2 puffs into the lungs 2 (two) times a day. (Patient not taking: Reported on 11/22/2019) 10.7 g 5   No current facility-administered medications for this visit.    OBJECTIVE: Vitals:   11/29/19 1323  BP: (!) 132/84  Pulse: 84  Resp: 20  Temp: 99.2 F (37.3 C)     Body mass index is 39.24 kg/m.    ECOG FS:0 - Asymptomatic  General: Well-developed, well-nourished, no acute distress. Eyes: Pink conjunctiva, anicteric sclera. HEENT: Normocephalic, moist mucous membranes. Lungs: No audible wheezing or coughing. Heart: Regular rate and rhythm. Abdomen: Soft, nontender, no obvious distention. Musculoskeletal: No edema, cyanosis, or clubbing. Neuro: Alert, answering all questions appropriately. Cranial nerves grossly intact. Skin: No rashes or petechiae noted. Psych: Normal affect.  LAB RESULTS:  Lab Results  Component Value Date   NA 138 10/15/2018   K 4.1 10/15/2018   CL 105 10/15/2018   CO2 24 10/15/2018   GLUCOSE 128 (H) 10/15/2018   BUN 8 10/15/2018   CREATININE 0.76 10/15/2018   CALCIUM 9.2 10/15/2018   PROT 6.8 10/23/2018   ALBUMIN 4.6 10/23/2018   AST 13 10/23/2018   ALT 17 10/23/2018   ALKPHOS 66 10/23/2018   BILITOT 0.5 10/23/2018   GFRNONAA >60 10/15/2018   GFRAA >60 10/15/2018    Lab Results  Component Value Date   WBC 10.7 (H) 11/29/2019   NEUTROABS 7.0 11/29/2019   HGB 11.8 (L) 11/29/2019   HCT 37.0 11/29/2019   MCV 81.5 11/29/2019   PLT 537 (H) 11/29/2019   Lab Results  Component Value Date   IRON 24 (L) 11/29/2019   TIBC 465 (H) 11/29/2019   IRONPCTSAT 5 (L) 11/29/2019   Lab Results  Component Value Date   FERRITIN 9 (L) 11/29/2019     STUDIES: No results found.  ASSESSMENT: Thrombocytosis, iron deficiency anemia.  PLAN:  1.  Iron deficiency anemia: Patient's hemoglobin  has trended down and her iron stores are significantly reduced.  Proceed with 200 mg IV Venofer today.  Return to clinic in 1 and 2 weeks for additional infusion.  Patient will then return to clinic in 3 months with repeat laboratory work, further evaluation, and consideration of additional treatment.  Although patient is only 63, she may benefit from a colonoscopy in the near future. 2. Thrombocytosis: Mild.  Secondary to iron deficiency. Previously, JAK-2 mutation was negative.   I spent a total of 30 minutes reviewing chart data, face-to-face evaluation with the patient, counseling and coordination of care as detailed above.   Patient expressed understanding that she can return to clinic at any time if she has any questions, concerns, or complaints.   Lloyd Huger, MD 12/01/2019 8:08 AM

## 2019-11-29 ENCOUNTER — Inpatient Hospital Stay: Payer: 59

## 2019-11-29 ENCOUNTER — Other Ambulatory Visit: Payer: Self-pay

## 2019-11-29 ENCOUNTER — Encounter: Payer: Self-pay | Admitting: Oncology

## 2019-11-29 ENCOUNTER — Inpatient Hospital Stay: Payer: 59 | Attending: Oncology

## 2019-11-29 ENCOUNTER — Inpatient Hospital Stay (HOSPITAL_BASED_OUTPATIENT_CLINIC_OR_DEPARTMENT_OTHER): Payer: 59 | Admitting: Oncology

## 2019-11-29 VITALS — BP 132/84 | HR 84 | Temp 99.2°F | Resp 20 | Wt 235.8 lb

## 2019-11-29 VITALS — BP 145/86 | HR 82 | Resp 18

## 2019-11-29 DIAGNOSIS — F419 Anxiety disorder, unspecified: Secondary | ICD-10-CM | POA: Diagnosis not present

## 2019-11-29 DIAGNOSIS — D509 Iron deficiency anemia, unspecified: Secondary | ICD-10-CM | POA: Insufficient documentation

## 2019-11-29 DIAGNOSIS — Z79899 Other long term (current) drug therapy: Secondary | ICD-10-CM | POA: Insufficient documentation

## 2019-11-29 DIAGNOSIS — Z87891 Personal history of nicotine dependence: Secondary | ICD-10-CM | POA: Diagnosis not present

## 2019-11-29 DIAGNOSIS — E785 Hyperlipidemia, unspecified: Secondary | ICD-10-CM | POA: Diagnosis not present

## 2019-11-29 DIAGNOSIS — G4700 Insomnia, unspecified: Secondary | ICD-10-CM | POA: Diagnosis not present

## 2019-11-29 DIAGNOSIS — D751 Secondary polycythemia: Secondary | ICD-10-CM | POA: Insufficient documentation

## 2019-11-29 DIAGNOSIS — E559 Vitamin D deficiency, unspecified: Secondary | ICD-10-CM | POA: Insufficient documentation

## 2019-11-29 DIAGNOSIS — R7989 Other specified abnormal findings of blood chemistry: Secondary | ICD-10-CM | POA: Diagnosis not present

## 2019-11-29 LAB — CBC WITH DIFFERENTIAL/PLATELET
Abs Immature Granulocytes: 0.08 10*3/uL — ABNORMAL HIGH (ref 0.00–0.07)
Basophils Absolute: 0.1 10*3/uL (ref 0.0–0.1)
Basophils Relative: 1 %
Eosinophils Absolute: 0.3 10*3/uL (ref 0.0–0.5)
Eosinophils Relative: 3 %
HCT: 37 % (ref 36.0–46.0)
Hemoglobin: 11.8 g/dL — ABNORMAL LOW (ref 12.0–15.0)
Immature Granulocytes: 1 %
Lymphocytes Relative: 22 %
Lymphs Abs: 2.3 10*3/uL (ref 0.7–4.0)
MCH: 26 pg (ref 26.0–34.0)
MCHC: 31.9 g/dL (ref 30.0–36.0)
MCV: 81.5 fL (ref 80.0–100.0)
Monocytes Absolute: 0.9 10*3/uL (ref 0.1–1.0)
Monocytes Relative: 9 %
Neutro Abs: 7 10*3/uL (ref 1.7–7.7)
Neutrophils Relative %: 64 %
Platelets: 537 10*3/uL — ABNORMAL HIGH (ref 150–400)
RBC: 4.54 MIL/uL (ref 3.87–5.11)
RDW: 14.2 % (ref 11.5–15.5)
WBC: 10.7 10*3/uL — ABNORMAL HIGH (ref 4.0–10.5)
nRBC: 0 % (ref 0.0–0.2)

## 2019-11-29 LAB — IRON AND TIBC
Iron: 24 ug/dL — ABNORMAL LOW (ref 28–170)
Saturation Ratios: 5 % — ABNORMAL LOW (ref 10.4–31.8)
TIBC: 465 ug/dL — ABNORMAL HIGH (ref 250–450)
UIBC: 441 ug/dL

## 2019-11-29 LAB — FERRITIN: Ferritin: 9 ng/mL — ABNORMAL LOW (ref 11–307)

## 2019-11-29 MED ORDER — SODIUM CHLORIDE 0.9 % IV SOLN
INTRAVENOUS | Status: DC
  Filled 2019-11-29: qty 250

## 2019-11-29 MED ORDER — IRON SUCROSE 20 MG/ML IV SOLN
200.0000 mg | Freq: Once | INTRAVENOUS | Status: AC
  Administered 2019-11-29: 200 mg via INTRAVENOUS
  Filled 2019-11-29: qty 10

## 2019-11-29 NOTE — Progress Notes (Signed)
Patient denies any concerns today.  

## 2019-12-02 ENCOUNTER — Encounter (HOSPITAL_COMMUNITY): Payer: Self-pay | Admitting: Psychiatry

## 2019-12-02 ENCOUNTER — Encounter: Payer: Self-pay | Admitting: Family Medicine

## 2019-12-02 ENCOUNTER — Other Ambulatory Visit: Payer: Self-pay

## 2019-12-02 ENCOUNTER — Ambulatory Visit (HOSPITAL_COMMUNITY): Payer: 59 | Admitting: Psychiatry

## 2019-12-02 DIAGNOSIS — F3341 Major depressive disorder, recurrent, in partial remission: Secondary | ICD-10-CM

## 2019-12-02 DIAGNOSIS — L989 Disorder of the skin and subcutaneous tissue, unspecified: Secondary | ICD-10-CM | POA: Insufficient documentation

## 2019-12-02 NOTE — Progress Notes (Signed)
Client unable to meet today due to a family emergency. Client would like to reschedule for later in the month. Client is safe and stable at this time.   Lise Auer, LCSW

## 2019-12-03 ENCOUNTER — Other Ambulatory Visit: Payer: Self-pay

## 2019-12-03 ENCOUNTER — Ambulatory Visit (HOSPITAL_COMMUNITY)
Admission: RE | Admit: 2019-12-03 | Discharge: 2019-12-03 | Disposition: A | Discharge status: Home / Self Care | Payer: 59 | Source: Ambulatory Visit | Attending: Family Medicine | Admitting: Family Medicine

## 2019-12-03 ENCOUNTER — Encounter (HOSPITAL_COMMUNITY): Payer: Self-pay

## 2019-12-03 DIAGNOSIS — R202 Paresthesia of skin: Secondary | ICD-10-CM | POA: Insufficient documentation

## 2019-12-03 DIAGNOSIS — G8929 Other chronic pain: Secondary | ICD-10-CM | POA: Diagnosis present

## 2019-12-03 DIAGNOSIS — M5442 Lumbago with sciatica, left side: Secondary | ICD-10-CM | POA: Diagnosis not present

## 2019-12-03 DIAGNOSIS — R200 Anesthesia of skin: Secondary | ICD-10-CM | POA: Insufficient documentation

## 2019-12-05 ENCOUNTER — Inpatient Hospital Stay: Payer: 59 | Attending: Oncology

## 2019-12-05 ENCOUNTER — Other Ambulatory Visit: Payer: Self-pay | Admitting: Family Medicine

## 2019-12-05 ENCOUNTER — Telehealth: Payer: Self-pay | Admitting: Family Medicine

## 2019-12-05 ENCOUNTER — Other Ambulatory Visit: Payer: Self-pay

## 2019-12-05 VITALS — BP 158/90 | HR 68 | Temp 98.4°F | Resp 18

## 2019-12-05 DIAGNOSIS — R200 Anesthesia of skin: Secondary | ICD-10-CM

## 2019-12-05 DIAGNOSIS — R202 Paresthesia of skin: Secondary | ICD-10-CM

## 2019-12-05 DIAGNOSIS — G8929 Other chronic pain: Secondary | ICD-10-CM

## 2019-12-05 DIAGNOSIS — Z79899 Other long term (current) drug therapy: Secondary | ICD-10-CM | POA: Insufficient documentation

## 2019-12-05 DIAGNOSIS — D509 Iron deficiency anemia, unspecified: Secondary | ICD-10-CM | POA: Insufficient documentation

## 2019-12-05 MED ORDER — OXYCODONE-ACETAMINOPHEN 5-325 MG PO TABS: 1.0000 | ORAL_TABLET | Freq: Three times a day (TID) | ORAL | 0 refills | Status: DC | PRN

## 2019-12-05 MED ORDER — SODIUM CHLORIDE 0.9 % IV SOLN
INTRAVENOUS | Status: DC
  Filled 2019-12-05: qty 250

## 2019-12-05 MED ORDER — IRON SUCROSE 20 MG/ML IV SOLN
200.0000 mg | Freq: Once | INTRAVENOUS | Status: AC
  Administered 2019-12-05: 200 mg via INTRAVENOUS
  Filled 2019-12-05: qty 10

## 2019-12-06 ENCOUNTER — Telehealth (INDEPENDENT_AMBULATORY_CARE_PROVIDER_SITE_OTHER): Payer: 59 | Admitting: Family Medicine

## 2019-12-06 DIAGNOSIS — R200 Anesthesia of skin: Secondary | ICD-10-CM

## 2019-12-06 DIAGNOSIS — F419 Anxiety disorder, unspecified: Secondary | ICD-10-CM

## 2019-12-06 DIAGNOSIS — G8929 Other chronic pain: Secondary | ICD-10-CM

## 2019-12-06 DIAGNOSIS — M5442 Lumbago with sciatica, left side: Secondary | ICD-10-CM | POA: Diagnosis not present

## 2019-12-06 DIAGNOSIS — Z09 Encounter for follow-up examination after completed treatment for conditions other than malignant neoplasm: Secondary | ICD-10-CM

## 2019-12-06 DIAGNOSIS — R202 Paresthesia of skin: Secondary | ICD-10-CM

## 2019-12-06 NOTE — Progress Notes (Signed)
Virtual Visit via Telephone Note  I connected with Erin Zamora on 12/06/19 at  1:50 PM EDT by telephone and verified that I am speaking with the correct person using two identifiers.   I discussed the limitations, risks, security and privacy concerns of performing an evaluation and management service by telephone and the availability of in person appointments. I also discussed with the patient that there may be a patient responsible charge related to this service. The patient expressed understanding and agreed to proceed.  Televisit Today Patient Location: Home Provider Location: Office   History of Present Illness:  Past Surgical History:  Procedure Laterality Date  . APPENDECTOMY  1991  . CHOLECYSTECTOMY  2003  . TUBAL LIGATION  2003    Social History   Socioeconomic History  . Marital status: Single    Spouse name: Not on file  . Number of children: Not on file  . Years of education: Not on file  . Highest education level: Not on file  Occupational History  . Not on file  Tobacco Use  . Smoking status: Former Smoker    Packs/day: 0.25    Types: Cigarettes    Quit date: 10/01/2018    Years since quitting: 1.1  . Smokeless tobacco: Never Used  . Tobacco comment: 5-7 ciggs per day  Vaping Use  . Vaping Use: Never used  Substance and Sexual Activity  . Alcohol use: Yes    Alcohol/week: 0.0 standard drinks    Comment: rare  . Drug use: No  . Sexual activity: Yes    Partners: Male    Birth control/protection: None  Other Topics Concern  . Not on file  Social History Narrative   48 year old and 40 y/o daughters   52 year old granddaughter - takes care of her in the afternoon after school   Significant other - 15 years   Work: Web designer   Social Determinants of Radio broadcast assistant Strain:   . Difficulty of Paying Living Expenses:   Food Insecurity:   . Worried About Charity fundraiser in the Last Year:   . Arboriculturist in the  Last Year:   Transportation Needs:   . Film/video editor (Medical):   Marland Kitchen Lack of Transportation (Non-Medical):   Physical Activity:   . Days of Exercise per Week:   . Minutes of Exercise per Session:   Stress:   . Feeling of Stress :   Social Connections:   . Frequency of Communication with Friends and Family:   . Frequency of Social Gatherings with Friends and Family:   . Attends Religious Services:   . Active Member of Clubs or Organizations:   . Attends Archivist Meetings:   Marland Kitchen Marital Status:   Intimate Partner Violence:   . Fear of Current or Ex-Partner:   . Emotionally Abused:   Marland Kitchen Physically Abused:   . Sexually Abused:     Family History  Problem Relation Age of Onset  . Alcohol abuse Mother   . COPD Mother   . Hypertension Mother   . Alcohol abuse Father   . Diabetes Father   . Drug abuse Father   . Heart disease Father   . Hypertension Father   . Kidney disease Father   . Arthritis Maternal Grandmother   . Arthritis Maternal Grandfather   . Cancer Paternal Grandmother   . Diabetes Paternal Grandmother   . Heart disease Paternal Grandmother   .  Diabetes Paternal Grandfather   . Heart disease Paternal Grandfather   . Kidney disease Paternal Grandfather   . Stroke Paternal Grandfather     Allergies  Allergen Reactions  . Codeine Itching and Nausea And Vomiting    Past Medical History:  Diagnosis Date  . Allergy    seasonal  . Anemia   . Anxiety   . CAP (community acquired pneumonia) 10/2018  . Cough 10/2018  . Hyperlipidemia 10/2018  . Insomnia 10/2018  . Microcytic anemia   . Skin lesions 10/2019  . Thrombocytosis ([place])   . Vitamin D deficiency 10/2018   Current Status: Since her last office visit, she continues to have increased back pain, which recent scans revealed stenosis. She has been placed on light duty at work for the past few weeks. She denies fevers, chills, fatigue, recent infections, weight loss, and night sweats. She  has not had any headaches, visual changes, dizziness, and falls. No chest pain, heart palpitations, cough and shortness of breath reported. Denies GI problems such as nausea, vomiting, diarrhea, and constipation. She has no reports of blood in stools, dysuria and hematuria. No depression or anxiety reported. She is taking all medications as prescribed.     Observations/Objective:  Telephone Visit   Assessment and Plan:  1. Chronic left-sided low back pain with left-sided sciatica Rx for Percocet sent to pharmacy today. Referral to Neurosurgery.  2. Numbness and tingling of left lower extremity  3. Anxiety  4. Follow up She wlil follow up in 2 months.   No orders of the defined types were placed in this encounter.   No orders of the defined types were placed in this encounter.   Referral Orders  No referral(s) requested today    Kathe Becton,  MSN, FNP-BC [facility] [address] [place], [place] [address] [phone]- fax     I discussed the assessment and treatment plan with the patient. The patient was provided an opportunity to ask questions and all were answered. The patient agreed with the plan and demonstrated an understanding of the instructions.   The patient was advised to call back or seek an in-person evaluation if the symptoms worsen or if the condition fails to improve as anticipated.  I provided 20 minutes of non-face-to-face time during this encounter.   Azzie Glatter, FNP

## 2019-12-06 NOTE — Telephone Encounter (Signed)
Done

## 2019-12-12 ENCOUNTER — Inpatient Hospital Stay: Payer: 59

## 2019-12-12 ENCOUNTER — Other Ambulatory Visit: Payer: Self-pay

## 2019-12-12 ENCOUNTER — Encounter: Payer: Self-pay | Admitting: Family Medicine

## 2019-12-12 VITALS — BP 150/77 | HR 75 | Temp 97.3°F | Resp 16

## 2019-12-12 DIAGNOSIS — D509 Iron deficiency anemia, unspecified: Secondary | ICD-10-CM | POA: Diagnosis not present

## 2019-12-12 MED ORDER — SODIUM CHLORIDE 0.9 % IV SOLN
Freq: Once | INTRAVENOUS | Status: AC
  Filled 2019-12-12: qty 250

## 2019-12-12 MED ORDER — IRON SUCROSE 20 MG/ML IV SOLN
200.0000 mg | Freq: Once | INTRAVENOUS | Status: AC
  Administered 2019-12-12: 200 mg via INTRAVENOUS
  Filled 2019-12-12: qty 10

## 2019-12-20 ENCOUNTER — Other Ambulatory Visit: Payer: Self-pay | Admitting: Family Medicine

## 2019-12-20 ENCOUNTER — Telehealth: Payer: Self-pay | Admitting: Family Medicine

## 2019-12-20 ENCOUNTER — Encounter: Payer: Self-pay | Admitting: Family Medicine

## 2019-12-20 DIAGNOSIS — Z808 Family history of malignant neoplasm of other organs or systems: Secondary | ICD-10-CM

## 2019-12-20 DIAGNOSIS — M5442 Lumbago with sciatica, left side: Secondary | ICD-10-CM

## 2019-12-20 DIAGNOSIS — G8929 Other chronic pain: Secondary | ICD-10-CM

## 2019-12-20 DIAGNOSIS — R202 Paresthesia of skin: Secondary | ICD-10-CM

## 2019-12-20 DIAGNOSIS — R234 Changes in skin texture: Secondary | ICD-10-CM

## 2019-12-20 DIAGNOSIS — R200 Anesthesia of skin: Secondary | ICD-10-CM

## 2019-12-20 MED ORDER — PREDNISONE 10 MG PO TABS: ORAL_TABLET | ORAL | 0 refills | Status: DC

## 2019-12-20 MED ORDER — OXYCODONE-ACETAMINOPHEN 5-325 MG PO TABS: 1.0000 | ORAL_TABLET | Freq: Three times a day (TID) | ORAL | 0 refills | Status: DC | PRN

## 2019-12-23 NOTE — Telephone Encounter (Signed)
Done

## 2019-12-26 ENCOUNTER — Other Ambulatory Visit: Payer: Self-pay

## 2019-12-26 ENCOUNTER — Encounter (HOSPITAL_COMMUNITY): Payer: Self-pay | Admitting: Psychiatry

## 2019-12-26 ENCOUNTER — Ambulatory Visit (HOSPITAL_COMMUNITY): Payer: 59 | Admitting: Psychiatry

## 2019-12-26 DIAGNOSIS — F411 Generalized anxiety disorder: Secondary | ICD-10-CM

## 2019-12-26 NOTE — Progress Notes (Signed)
Counselor connected with Erin Zamora, who stated she was demoted from her last position. Erin Zamora stated that she was asked to cover for someone today on the job, making her not able to fully participate in session. Erin Zamora identifies that therapy would be beneficial because she continues to put the needs and wants of other before her own. She requested that we schedule for my next available. Counselor encouraged self-care, boundary setting and communicating needs.

## 2019-12-30 ENCOUNTER — Encounter: Payer: Self-pay | Admitting: Family Medicine

## 2020-01-17 ENCOUNTER — Other Ambulatory Visit: Payer: Self-pay | Admitting: Family Medicine

## 2020-01-17 ENCOUNTER — Telehealth: Payer: Self-pay | Admitting: Family Medicine

## 2020-01-17 DIAGNOSIS — M5442 Lumbago with sciatica, left side: Secondary | ICD-10-CM

## 2020-01-17 DIAGNOSIS — G8929 Other chronic pain: Secondary | ICD-10-CM

## 2020-01-17 MED ORDER — ACETAMINOPHEN 500 MG PO TABS: ORAL_TABLET | ORAL | 3 refills | Status: DC

## 2020-01-20 ENCOUNTER — Other Ambulatory Visit: Payer: Self-pay | Admitting: Psychiatry

## 2020-01-20 DIAGNOSIS — F3341 Major depressive disorder, recurrent, in partial remission: Secondary | ICD-10-CM

## 2020-01-20 DIAGNOSIS — F411 Generalized anxiety disorder: Secondary | ICD-10-CM

## 2020-01-20 NOTE — Telephone Encounter (Signed)
Done

## 2020-01-22 ENCOUNTER — Other Ambulatory Visit: Payer: Self-pay

## 2020-01-22 ENCOUNTER — Encounter (HOSPITAL_COMMUNITY): Payer: Self-pay | Admitting: Psychiatry

## 2020-01-22 ENCOUNTER — Ambulatory Visit (INDEPENDENT_AMBULATORY_CARE_PROVIDER_SITE_OTHER): Payer: 59 | Admitting: Psychiatry

## 2020-01-22 DIAGNOSIS — F411 Generalized anxiety disorder: Secondary | ICD-10-CM | POA: Diagnosis not present

## 2020-01-22 NOTE — Progress Notes (Signed)
Virtual Visit via Video Note  I connected with Erin Zamora on 01/22/20 at  3:30 PM EDT by a video enabled telemedicine application and verified that I am speaking with the correct person using two identifiers.  Location: Patient: Patient Home Provider: Home Office   I discussed the limitations of evaluation and management by telemedicine and the availability of in person appointments. The patient expressed understanding and agreed to proceed.  History of Present Illness: GAD  Treatment Plan Goals: 1) Erin Zamora would like to reduce the impact of work-related stress by improving boundary setting, communicating needs and applying coping strategies during work hours.  2) Erin Zamora would like to reduce paranoia, depressive/anxiety symptoms by addressing cognitive distortions through learning and applying CBT skills/interventions.  3) Erin Zamora feels disconnected from self and would like to explore her identity, interest and preferences to create a better work-life balance to alleviate stress and anxiety.  Observations/Objective: Counselor met with Client for individual therapy via Webex. Counselor assessed MH symptoms and progress on treatment plan goals, with patient reporting application of skills discussed in previous session. Counselor praised the client for efforts in boundary setting, assertive communication skills, behaviors changes and acceptance of things that are out of her control. Client expressed experiences and frustrations with family and household dynamics. Counselor shared psychoeducation on developmental stages of life and the family life cycle. Counselor and Client identified additional strategies to apply when communicating boundaries with partner and daughter Counselor assessed history of family dynamics and how she formed and shaped her parenting style. Counselor and Client worked on IT trainer to apply in the workplace. Counselor praised client again for the  work she is doing in and out of session to better herself and positively address her treatment plan goals. Counselor promoted self-care practices for client to engage in as a needed award and appreciation to self. Client presents with moderate depression and severe anxiety. Client denied suicidal ideation or self-harm behaviors.   Assessment and Plan: Counselor will continue to meet with patient to address treatment plan goals. Patient will continue to follow recommendations of providers and implement skills learned in session.  Follow Up Instructions: Counselor will send information for next session via Webex.    The patient was advised to call back or seek an in-person evaluation if the symptoms worsen or if the condition fails to improve as anticipated.  I provided 45 minutes of non-face-to-face time during this encounter.   Lise Auer, LCSW

## 2020-01-23 ENCOUNTER — Ambulatory Visit (HOSPITAL_COMMUNITY): Payer: 59 | Admitting: Psychiatry

## 2020-01-24 ENCOUNTER — Encounter: Payer: Self-pay | Admitting: Family Medicine

## 2020-01-24 ENCOUNTER — Other Ambulatory Visit: Payer: Self-pay

## 2020-01-24 ENCOUNTER — Ambulatory Visit (INDEPENDENT_AMBULATORY_CARE_PROVIDER_SITE_OTHER): Payer: 59 | Admitting: Family Medicine

## 2020-01-24 VITALS — BP 139/73 | HR 81 | Temp 99.4°F | Ht 65.0 in | Wt 247.6 lb

## 2020-01-24 DIAGNOSIS — R200 Anesthesia of skin: Secondary | ICD-10-CM | POA: Diagnosis not present

## 2020-01-24 DIAGNOSIS — F419 Anxiety disorder, unspecified: Secondary | ICD-10-CM | POA: Diagnosis not present

## 2020-01-24 DIAGNOSIS — E041 Nontoxic single thyroid nodule: Secondary | ICD-10-CM

## 2020-01-24 DIAGNOSIS — Z09 Encounter for follow-up examination after completed treatment for conditions other than malignant neoplasm: Secondary | ICD-10-CM

## 2020-01-24 DIAGNOSIS — G8929 Other chronic pain: Secondary | ICD-10-CM | POA: Diagnosis not present

## 2020-01-24 DIAGNOSIS — R202 Paresthesia of skin: Secondary | ICD-10-CM

## 2020-01-24 DIAGNOSIS — M5442 Lumbago with sciatica, left side: Secondary | ICD-10-CM | POA: Diagnosis not present

## 2020-01-24 DIAGNOSIS — R8290 Unspecified abnormal findings in urine: Secondary | ICD-10-CM

## 2020-01-24 LAB — POCT URINALYSIS DIPSTICK
Bilirubin, UA: NEGATIVE
Glucose, UA: NEGATIVE
Ketones, UA: NEGATIVE
Nitrite, UA: NEGATIVE
Protein, UA: NEGATIVE
Spec Grav, UA: >=1.03 — AB (ref 1.010–1.025)
Urobilinogen, UA: 0.2 E.U./dL
pH, UA: 5.5 (ref 5.0–8.0)

## 2020-01-24 NOTE — Progress Notes (Signed)
A appt was made for Erin Zamora ultrasound but she stated she could not make that appt because her husband already have an appt. Pt stated she will  Call radiology to schudule her own appt.

## 2020-01-24 NOTE — Progress Notes (Signed)
Patient [facility]    Established Patient Office Visit  Subjective:  Patient ID: Erin Zamora, female    DOB: 09/17/1970  Age: 49 y.o. MRN: [id number]  CC:  Chief Complaint  Patient presents with  . Follow-up    Pt states she has a knot on the Rside in the middle of her throat. X3wks.    HPI Erin Zamora is a 49 year old female who presents for Follow Up today.    Patient Active Problem List   Diagnosis Date Noted  . Skin lesions, generalized 12/02/2019  . Recurrent major depressive disorder, in partial remission ([place]) 09/23/2019  . MDD (major depressive disorder), recurrent episode, moderate ([place]) 07/17/2019  . Generalized anxiety disorder 07/17/2019  . Paranoia ([place]) 05/26/2019  . Chronic left-sided low back pain with left-sided sciatica 01/01/2019  . Numbness and tingling of left lower extremity 01/01/2019  . Iron deficiency anemia 11/09/2018  . Thrombocytosis ([place]) 11/03/2018  . COPD with chronic bronchitis and emphysema ([place]) 10/29/2018  . Cough 10/23/2018  . Chest congestion 10/23/2018  . Anxiety 10/23/2018  . Anemia 10/23/2018  . RLL pneumonia 10/13/2018  . Elevated blood pressure reading 10/13/2018  . Obesity (BMI 30.0-34.9) 10/13/2018  . Vitamin D deficiency 10/2018   Current Status: Since her last office visit, she is doing well with no complaints. She has been having difficulties swallowing and noticed a 'knot' in her throat. She continue to have chronic back pain. She denies fevers, chills, fatigue, recent infections, weight loss, and night sweats. She has not had any headaches, visual changes, dizziness, and falls. No chest pain, heart palpitations, cough and shortness of breath reported. Denies GI problems such as nausea, vomiting, diarrhea, and constipation. She has no reports of blood in stools, dysuria and hematuria. No depression or anxiety reported today.  She is taking all medications as  prescribed.  Past Medical History:  Diagnosis Date  . Allergy    seasonal  . Anemia   . Anxiety   . CAP (community acquired pneumonia) 10/2018  . Cough 10/2018  . Hyperlipidemia 10/2018  . Insomnia 10/2018  . Microcytic anemia   . Skin lesions 10/2019  . Thrombocytosis ([place])   . Vitamin D deficiency 10/2018    Past Surgical History:  Procedure Laterality Date  . APPENDECTOMY  1991  . CHOLECYSTECTOMY  2003  . TUBAL LIGATION  2003    Family History  Problem Relation Age of Onset  . Alcohol abuse Mother   . COPD Mother   . Hypertension Mother   . Alcohol abuse Father   . Diabetes Father   . Drug abuse Father   . Heart disease Father   . Hypertension Father   . Kidney disease Father   . Arthritis Maternal Grandmother   . Arthritis Maternal Grandfather   . Melanoma Maternal Grandfather   . Cancer Paternal Grandmother   . Diabetes Paternal Grandmother   . Heart disease Paternal Grandmother   . Diabetes Paternal Grandfather   . Heart disease Paternal Grandfather   . Kidney disease Paternal Grandfather   . Stroke Paternal Grandfather     Social History   Socioeconomic History  . Marital status: Single    Spouse name: Not on file  . Number of children: Not on file  . Years of education: Not on file  . Highest education level: Not on file  Occupational History  . Not on file  Tobacco Use  . Smoking status: Former  Smoker    Packs/day: 0.25    Types: Cigarettes    Quit date: 10/01/2018    Years since quitting: 1.3  . Smokeless tobacco: Never Used  . Tobacco comment: 5-7 ciggs per day  Vaping Use  . Vaping Use: Never used  Substance and Sexual Activity  . Alcohol use: Yes    Alcohol/week: 0.0 standard drinks    Comment: rare  . Drug use: No  . Sexual activity: Yes    Partners: Male    Birth control/protection: None  Other Topics Concern  . Not on file  Social History Narrative   48 year old and 56 y/o daughters   10 year old granddaughter - takes care  of her in the afternoon after school   Significant other - 15 years   Work: Web designer   Social Determinants of Radio broadcast assistant Strain:   . Difficulty of Paying Living Expenses: Not on file  Food Insecurity:   . Worried About Charity fundraiser in the Last Year: Not on file  . Ran Out of Food in the Last Year: Not on file  Transportation Needs:   . Lack of Transportation (Medical): Not on file  . Lack of Transportation (Non-Medical): Not on file  Physical Activity:   . Days of Exercise per Week: Not on file  . Minutes of Exercise per Session: Not on file  Stress:   . Feeling of Stress : Not on file  Social Connections:   . Frequency of Communication with Friends and Family: Not on file  . Frequency of Social Gatherings with Friends and Family: Not on file  . Attends Religious Services: Not on file  . Active Member of Clubs or Organizations: Not on file  . Attends Archivist Meetings: Not on file  . Marital Status: Not on file  Intimate Partner Violence:   . Fear of Current or Ex-Partner: Not on file  . Emotionally Abused: Not on file  . Physically Abused: Not on file  . Sexually Abused: Not on file    Outpatient Medications Prior to Visit  Medication Sig Dispense Refill  . gabapentin (NEURONTIN) 300 MG capsule Take 1 capsule (300 mg total) by mouth 3 (three) times daily. 90 capsule 3  . ibuprofen (ADVIL) 800 MG tablet TAKE 1 TABLET BY MOUTH EVERY 8 HOURS AS NEEDED FOR MODERATE PAIN 30 tablet 0  . sertraline (ZOLOFT) 100 MG tablet Take 1 tablet by mouth once daily 30 tablet 0  . Vitamin D, Ergocalciferol, (DRISDOL) 1.25 MG (50000 UT) CAPS capsule Take 1 capsule (50,000 Units total) by mouth every 7 (seven) days. 5 capsule 6  . acetaminophen (TYLENOL) 500 MG tablet Take 2 tablets (1,000 mg), by mouth, 2 times a day. (Patient not taking: Reported on 01/24/2020) 120 tablet 3  . Glycopyrrolate-Formoterol (BEVESPI AEROSPHERE) 9-4.8 MCG/ACT AERO  Inhale 2 puffs into the lungs 2 (two) times a day. (Patient not taking: Reported on 11/22/2019) 10.7 g 5  . predniSONE (DELTASONE) 10 MG tablet Day #1: Take 6 tablets by mouth Day #2: Take 5 tablets by mouth Day #3: Take 4 tablets by mouth Day #4: Take 3 tablets by mouth Day #5: Take 2 tablets by mouth Day #6: Take 1 tablet, then complete. 21 tablet 0   No facility-administered medications prior to visit.    Allergies  Allergen Reactions  . Codeine Itching and Nausea And Vomiting    ROS Review of Systems  Constitutional: Negative.   HENT:  Positive for trouble swallowing (occasional ).        Reports 'lump' on right side of throat.   Eyes: Negative.   Respiratory: Negative.   Cardiovascular: Negative.   Gastrointestinal: Negative.   Endocrine: Negative.   Genitourinary: Negative.   Musculoskeletal: Positive for arthralgias (generalized).  Skin: Negative.   Allergic/Immunologic: Negative.   Neurological: Negative.   Hematological: Negative.   Psychiatric/Behavioral: Negative.     Objective:    Physical Exam Vitals and nursing note reviewed.  Constitutional:      Appearance: Normal appearance.  HENT:     Head: Normocephalic and atraumatic.     Nose: Nose normal.     Mouth/Throat:     Mouth: Mucous membranes are moist.     Pharynx: Oropharynx is clear.  Neck:   Cardiovascular:     Rate and Rhythm: Normal rate and regular rhythm.     Pulses: Normal pulses.     Heart sounds: Normal heart sounds.  Pulmonary:     Effort: Pulmonary effort is normal.     Breath sounds: Normal breath sounds.  Abdominal:     General: Bowel sounds are normal.     Palpations: Abdomen is soft.  Musculoskeletal:        General: Normal range of motion.     Cervical back: Normal range of motion and neck supple. Tenderness present.  Skin:    General: Skin is warm and dry.  Neurological:     General: No focal deficit present.     Mental Status: She is alert and oriented to person, place,  and time.  Psychiatric:        Mood and Affect: Mood normal.        Behavior: Behavior normal.        Thought Content: Thought content normal.        Judgment: Judgment normal.    BP 139/73 (BP Location: Left Arm, Patient Position: Sitting, Cuff Size: Large)   Pulse 81   Temp 99.4 F (37.4 C)   Ht 5\' 5"  (1.651 m)   Wt 247 lb 9.6 oz (112.3 kg)   LMP 12/24/2019 (Exact Date)   SpO2 95%   BMI 41.20 kg/m  Wt Readings from Last 3 Encounters:  01/24/20 247 lb 9.6 oz (112.3 kg)  11/29/19 (!) 235 lb 12.8 oz (107 kg)  11/22/19 (!) 218 lb 3.2 oz (99 kg)    Health Maintenance Due  Topic Date Due  . Hepatitis C Screening  Never done  . COVID-19 Vaccine (1) Never done  . PAP SMEAR-Modifier  Never done  . TETANUS/TDAP  05/03/2019  . INFLUENZA VACCINE  Never done    There are no preventive care reminders to display for this patient.  Lab Results  Component Value Date   TSH 0.961 10/14/2018   Lab Results  Component Value Date   WBC 10.7 (H) 11/29/2019   HGB 11.8 (L) 11/29/2019   HCT 37.0 11/29/2019   MCV 81.5 11/29/2019   PLT 537 (H) 11/29/2019   Lab Results  Component Value Date   NA 138 10/15/2018   K 4.1 10/15/2018   CO2 24 10/15/2018   GLUCOSE 128 (H) 10/15/2018   BUN 8 10/15/2018   CREATININE 0.76 10/15/2018   BILITOT 0.5 10/23/2018   ALKPHOS 66 10/23/2018   AST 13 10/23/2018   ALT 17 10/23/2018   PROT 6.8 10/23/2018   ALBUMIN 4.6 10/23/2018   CALCIUM 9.2 10/15/2018   ANIONGAP 9 10/15/2018   Lab Results  Component Value Date   CHOL 168 10/23/2018   Lab Results  Component Value Date   HDL 42 10/23/2018   Lab Results  Component Value Date   LDLCALC 80 10/23/2018   Lab Results  Component Value Date   TRIG 228 (H) 10/23/2018   Lab Results  Component Value Date   CHOLHDL 4.0 10/23/2018   Lab Results  Component Value Date   HGBA1C 5.9 (A) 11/22/2019   HGBA1C 5.9 11/22/2019   HGBA1C 5.9 11/22/2019   HGBA1C 5.9 11/22/2019   Assessment & Plan:    1. Chronic left-sided low back pain with left-sided sciatica - Urinalysis Dipstick - Ambulatory referral to Orthopedic Surgery  2. Thyroid nodule - US THYROID; Future  3. Numbness and tingling of left lower extremity - Ambulatory referral to Orthopedic Surgery  4. Anxiety Stable today.   5. Follow up She will keep follow up appointment.   No orders of the defined types were placed in this encounter.   Orders Placed This Encounter  Procedures  . US THYROID  . Ambulatory referral to Orthopedic Surgery  . Urinalysis Dipstick    Kathe Becton,  MSN, FNP-BC [facility] [place] [address] [place], [place] [address] [phone] fax  Problem List Items Addressed This Visit      Nervous and Auditory   Chronic left-sided low back pain with left-sided sciatica - Primary   Relevant Orders   Urinalysis Dipstick   Ambulatory referral to Orthopedic Surgery     Other   Anxiety   Numbness and tingling of left lower extremity   Relevant Orders   Ambulatory referral to Orthopedic Surgery    Other Visit Diagnoses    Thyroid nodule       Relevant Orders   US THYROID   Follow up          No orders of the defined types were placed in this encounter.   Follow-up: No follow-ups on file.    Azzie Glatter, FNP

## 2020-01-26 LAB — URINE CULTURE

## 2020-01-27 ENCOUNTER — Ambulatory Visit: Payer: Self-pay | Admitting: Family Medicine

## 2020-01-28 ENCOUNTER — Encounter: Payer: Self-pay | Admitting: Family Medicine

## 2020-01-28 ENCOUNTER — Telehealth: Payer: Self-pay

## 2020-01-29 ENCOUNTER — Other Ambulatory Visit: Payer: Self-pay | Admitting: Family Medicine

## 2020-01-29 DIAGNOSIS — E041 Nontoxic single thyroid nodule: Secondary | ICD-10-CM

## 2020-01-29 NOTE — Telephone Encounter (Signed)
Sent to provider and CMA °

## 2020-01-30 ENCOUNTER — Encounter (HOSPITAL_COMMUNITY): Payer: Self-pay | Admitting: Psychiatry

## 2020-01-30 ENCOUNTER — Other Ambulatory Visit: Payer: Self-pay

## 2020-01-30 ENCOUNTER — Telehealth (INDEPENDENT_AMBULATORY_CARE_PROVIDER_SITE_OTHER): Payer: 59 | Admitting: Psychiatry

## 2020-01-30 ENCOUNTER — Ambulatory Visit (HOSPITAL_COMMUNITY): Payer: 59

## 2020-01-30 DIAGNOSIS — F3341 Major depressive disorder, recurrent, in partial remission: Secondary | ICD-10-CM

## 2020-01-30 DIAGNOSIS — F411 Generalized anxiety disorder: Secondary | ICD-10-CM

## 2020-01-30 MED ORDER — SERTRALINE HCL 100 MG PO TABS: 100.0000 mg | ORAL_TABLET | Freq: Every day | ORAL | 1 refills | Status: DC

## 2020-01-30 NOTE — Progress Notes (Signed)
BH MD/PA/NP OP Progress Note  Virtual Visit via Video Note  I connected with Erin Zamora on 01/30/20 at  8:20 AM EDT by a video enabled telemedicine application and verified that I am speaking with the correct person using two identifiers.  Location: Patient: Home Provider: Clinic   I discussed the limitations of evaluation and management by telemedicine and the availability of in person appointments. The patient expressed understanding and agreed to proceed.  I provided 18 minutes of non-face-to-face time during this encounter.    01/30/2020 8:26 AM Erin Zamora  MRN:  [id number]  Chief Complaint: " I feel anxious at times."   HPI: Patient reported that she feels her depression is under control however she still feels anxious at times.  She stated that her episodes of anxiety come out of nowhere and she feels her hands getting tight.  She sometimes feels anxious when she is at home after work watching TV.  She asked for a medication that she can take for as and when needed basis to help with these anxiety episodes.  She was offered hydroxyzine 10 mg twice daily as needed. She expressed frustration with her current PCP.  She stated that she has gained almost 30 pounds in the last 3 months which is not usual for her.  In addition to that she stated that she noticed some changes in her voice and on examination her PCP found a palpable thyroid nodule and was supposed to refer her out however there has been a delay in that.  She stated that she is supposed to undergo ultrasound but she still does not have an appointment scheduled.  She also informed that her thyroid panel was not repeated despite the complaint about a possible nodule.  She also mentioned that she has some skin lesions and she had requested referral to a dermatologist however referral to gynecologist was made. She is in the process of looking for a new PCP. Writer checked her labs, last TSH was WNL and was done  back in June 2020.  Writer informed her that Probation officer can order her thyroid panel as part of the baseline investigation.  She continues to have a hectic work schedule although she stated that she is trying hard to get control of her health conditions.  Visit Diagnosis:    ICD-10-CM   1. Recurrent major depressive disorder, in partial remission ([place])  F33.41   2. Generalized anxiety disorder  F41.1     Past Psychiatric History: MDD, anxiety  Past Medical History:  Past Medical History:  Diagnosis Date  . Allergy    seasonal  . Anemia   . Anxiety   . CAP (community acquired pneumonia) 10/2018  . Cough 10/2018  . Hyperlipidemia 10/2018  . Insomnia 10/2018  . Microcytic anemia   . Skin lesions 10/2019  . Thrombocytosis ([place])   . Vitamin D deficiency 10/2018    Past Surgical History:  Procedure Laterality Date  . APPENDECTOMY  1991  . CHOLECYSTECTOMY  2003  . TUBAL LIGATION  2003    Family Psychiatric History: see below  Family History:  Family History  Problem Relation Age of Onset  . Alcohol abuse Mother   . COPD Mother   . Hypertension Mother   . Alcohol abuse Father   . Diabetes Father   . Drug abuse Father   . Heart disease Father   . Hypertension Father   . Kidney disease Father   . Arthritis Maternal Grandmother   .  Arthritis Maternal Grandfather   . Melanoma Maternal Grandfather   . Cancer Paternal Grandmother   . Diabetes Paternal Grandmother   . Heart disease Paternal Grandmother   . Diabetes Paternal Grandfather   . Heart disease Paternal Grandfather   . Kidney disease Paternal Grandfather   . Stroke Paternal Grandfather     Social History:  Social History   Socioeconomic History  . Marital status: Single    Spouse name: Not on file  . Number of children: Not on file  . Years of education: Not on file  . Highest education level: Not on file  Occupational History  . Not on file  Tobacco Use  . Smoking status: Former Smoker    Packs/day:  0.25    Types: Cigarettes    Quit date: 10/01/2018    Years since quitting: 1.3  . Smokeless tobacco: Never Used  . Tobacco comment: 5-7 ciggs per day  Vaping Use  . Vaping Use: Never used  Substance and Sexual Activity  . Alcohol use: Yes    Alcohol/week: 0.0 standard drinks    Comment: rare  . Drug use: No  . Sexual activity: Yes    Partners: Male    Birth control/protection: None  Other Topics Concern  . Not on file  Social History Narrative   48 year old and 56 y/o daughters   32 year old granddaughter - takes care of her in the afternoon after school   Significant other - 15 years   Work: Web designer   Social Determinants of Radio broadcast assistant Strain:   . Difficulty of Paying Living Expenses: Not on file  Food Insecurity:   . Worried About Charity fundraiser in the Last Year: Not on file  . Ran Out of Food in the Last Year: Not on file  Transportation Needs:   . Lack of Transportation (Medical): Not on file  . Lack of Transportation (Non-Medical): Not on file  Physical Activity:   . Days of Exercise per Week: Not on file  . Minutes of Exercise per Session: Not on file  Stress:   . Feeling of Stress : Not on file  Social Connections:   . Frequency of Communication with Friends and Family: Not on file  . Frequency of Social Gatherings with Friends and Family: Not on file  . Attends Religious Services: Not on file  . Active Member of Clubs or Organizations: Not on file  . Attends Archivist Meetings: Not on file  . Marital Status: Not on file    Allergies:  Allergies  Allergen Reactions  . Codeine Itching and Nausea And Vomiting    Metabolic Disorder Labs: Lab Results  Component Value Date   HGBA1C 5.9 (A) 11/22/2019   HGBA1C 5.9 11/22/2019   HGBA1C 5.9 11/22/2019   HGBA1C 5.9 11/22/2019   No results found for: PROLACTIN Lab Results  Component Value Date   CHOL 168 10/23/2018   TRIG 228 (H) 10/23/2018   HDL 42  10/23/2018   CHOLHDL 4.0 10/23/2018   [place] 80 10/23/2018   Lab Results  Component Value Date   TSH 0.961 10/14/2018    Therapeutic Level Labs: No results found for: LITHIUM No results found for: VALPROATE No components found for:  CBMZ  Current Medications: Current Outpatient Medications  Medication Sig Dispense Refill  . acetaminophen (TYLENOL) 500 MG tablet Take 2 tablets (1,000 mg), by mouth, 2 times a day. (Patient not taking: Reported on 01/24/2020) 120 tablet  3  . gabapentin (NEURONTIN) 300 MG capsule Take 1 capsule (300 mg total) by mouth 3 (three) times daily. 90 capsule 3  . Glycopyrrolate-Formoterol (BEVESPI AEROSPHERE) 9-4.8 MCG/ACT AERO Inhale 2 puffs into the lungs 2 (two) times a day. (Patient not taking: Reported on 11/22/2019) 10.7 g 5  . ibuprofen (ADVIL) 800 MG tablet TAKE 1 TABLET BY MOUTH EVERY 8 HOURS AS NEEDED FOR MODERATE PAIN 30 tablet 0  . sertraline (ZOLOFT) 100 MG tablet Take 1 tablet by mouth once daily 30 tablet 0  . Vitamin D, Ergocalciferol, (DRISDOL) 1.25 MG (50000 UT) CAPS capsule Take 1 capsule (50,000 Units total) by mouth every 7 (seven) days. 5 capsule 6   No current facility-administered medications for this visit.     Psychiatric Specialty Exam: Review of Systems  There were no vitals taken for this visit.There is no height or weight on file to calculate BMI.  General Appearance: Well Groomed  Eye Contact:  Good  Speech:  Clear and Coherent and Normal Rate  Volume:  Normal  Mood:  Anxious  Affect:  Congruent  Thought Process:  Goal Directed, Linear and Descriptions of Associations: Intact  Orientation:  Full (Time, Place, and Person)  Thought Content: Logical   Suicidal Thoughts:  No  Homicidal Thoughts:  No  Memory:  Recent;   Good Remote;   Good  Judgement:  Good  Insight:  Good  Psychomotor Activity:  Normal  Concentration:  Concentration: Good and Attention Span: Good  Recall:  Good  Fund of Knowledge: Good  Language: Good   Akathisia:  Negative  Handed:  Right  AIMS (if indicated): not done  Assets:  Communication Skills Desire for Improvement Financial Resources/Insurance Housing  ADL's:  Intact  Cognition: WNL  Sleep:  Fair     Screenings: PHQ2-9     Office Visit from 01/24/2020 in [place] Office Visit from 01/01/2019 in [place] Office Visit from 11/20/2018 in [place] Office Visit from 10/23/2018 in [place] Office Visit from 12/22/2015 in [facility] at [facility] Total Score 0 0 0 0 0       Assessment and Plan: Patient reported ongoing anxiety related issues.  Her depression symptoms are under control for now.  She has been dealing with some concerns for a possible thyroid nodule and is waiting to get radiology to investigation for the same.  She has not had any recent thyroid panel lab ordered so the writer went ahead and order that for her.  1. Recurrent major depressive disorder, in partial remission ([facility])  - sertraline (ZOLOFT) 100 MG tablet; Take 1 tablet (100 mg total) by mouth daily.  Dispense: 90 tablet; Refill: 1 - Thyroid Panel With TSH  2. Generalized anxiety disorder  - sertraline (ZOLOFT) 100 MG tablet; Take 1 tablet (100 mg total) by mouth daily.  Dispense: 90 tablet; Refill: 1  Writer recommended to the patient that she contact her PCP office again for referral to an endocrinologist as well as a dermatologist for the skin lesions she is mentioning.  Follow-up in 2 months. Continue individual therapy.    [place] Crane, MD 01/30/2020, 8:26 AM

## 2020-02-07 ENCOUNTER — Ambulatory Visit: Payer: Self-pay | Admitting: Obstetrics and Gynecology

## 2020-02-07 ENCOUNTER — Ambulatory Visit
Admission: RE | Admit: 2020-02-07 | Discharge: 2020-02-07 | Disposition: A | Discharge status: Home / Self Care | Payer: 59 | Source: Ambulatory Visit | Attending: Family Medicine | Admitting: Family Medicine

## 2020-02-07 DIAGNOSIS — E041 Nontoxic single thyroid nodule: Secondary | ICD-10-CM

## 2020-02-12 ENCOUNTER — Ambulatory Visit: Payer: Self-pay

## 2020-02-12 ENCOUNTER — Ambulatory Visit (INDEPENDENT_AMBULATORY_CARE_PROVIDER_SITE_OTHER): Payer: 59 | Admitting: Orthopaedic Surgery

## 2020-02-12 ENCOUNTER — Encounter: Payer: Self-pay | Admitting: Orthopaedic Surgery

## 2020-02-12 VITALS — BP 154/90 | HR 73 | Ht 65.0 in | Wt 241.0 lb

## 2020-02-12 DIAGNOSIS — M542 Cervicalgia: Secondary | ICD-10-CM | POA: Diagnosis not present

## 2020-02-12 DIAGNOSIS — M545 Low back pain, unspecified: Secondary | ICD-10-CM

## 2020-02-12 DIAGNOSIS — M4807 Spinal stenosis, lumbosacral region: Secondary | ICD-10-CM

## 2020-02-12 DIAGNOSIS — G8929 Other chronic pain: Secondary | ICD-10-CM

## 2020-02-12 DIAGNOSIS — M5442 Lumbago with sciatica, left side: Secondary | ICD-10-CM

## 2020-02-12 NOTE — Progress Notes (Signed)
Office Visit Note   Patient: Erin Zamora           Date of Birth: [birth year]           MRN: [id number] Visit Date: 02/12/2020              Requested by: Azzie Glatter, [place],  [place] [address] PCP: Azzie Glatter, FNP   Assessment & Plan: Visit Diagnoses:  1. Chronic bilateral low back pain, unspecified whether sciatica present   2. Neck pain   3. Spinal stenosis of lumbosacral region   4. Chronic left-sided low back pain with left-sided sciatica     Plan: Patient's had chronic pain for a year and a half she has depression and anxiety.  No medications were prescribed today.  I recommend proceeding with an MRI scan to evaluate her moderate spinal stenosis at L4-5.  We also discussed walking program.  Office follow-up after MRI scan.  Follow-Up Instructions: Return After lumbar MRI.   Orders:  Orders Placed This Encounter  Procedures  . XR Cervical Spine 2 or 3 views  . XR Lumbar Spine 2-3 Views  . MR Lumbar Spine w/o contrast   No orders of the defined types were placed in this encounter.     Procedures: No procedures performed   Clinical Data: No additional findings.   Subjective: Chief Complaint  Patient presents with  . Lower Back - Pain    HPI 49 year old female new patient visit with chronic back pain times greater than a year and a half.  She states she has back pain that radiates into her legs.  She states when she looks up she has pain that shoots from her neck down to her sacrum.  She has pain with any activities pain with standing problems with dishes cleaning.  She states she has pain when she sits or walks.  Numbness from her knee to her ankle and her left leg.  Past history of sciatica.  Patient uses TENS unit with therapy without relief.  She states nothing seems to help.  PDMP review shows 2 narcotic prescription since beginning of August.  Patient states she is used over-the-counter medication without relief.  CT  scan has been done on 12/03/2019 which shows mild to moderate spinal stenosis and mild to moderate subarticular stenosis at L4-5.  Patient's BMI is 40.  Review of Systems positive for anxiety, COPD, morbid obesity BMI 40 history of depression.  No current dyspnea.  No fever chills.  No associated bowel bladder symptoms.   Objective: Vital Signs: BP (!) 154/90   Pulse 73   Ht 5\' 5"  (1.651 m)   Wt 241 lb (109.3 kg)   BMI 40.10 kg/m   Physical Exam Constitutional:      Appearance: She is well-developed.  HENT:     Head: Normocephalic.     Right Ear: External ear normal.     Left Ear: External ear normal.  Eyes:     Pupils: Pupils are equal, round, and reactive to light.  Neck:     Thyroid: No thyromegaly.     Trachea: No tracheal deviation.  Cardiovascular:     Rate and Rhythm: Normal rate.  Pulmonary:     Effort: Pulmonary effort is normal.  Abdominal:     Palpations: Abdomen is soft.  Skin:    General: Skin is warm and dry.  Neurological:     Mental Status: She is alert and oriented to person,  place, and time.  Psychiatric:        Behavior: Behavior normal.     Ortho Exam patient is able to heel and toe walk.  Knee and ankle jerk are intact.  Negative logroll the hips.  No quad atrophy right or left.  Distal pulses are palpable.  No brachial plexus tenderness.  Complains of pain with cervical extension as well as flexion.  Specialty Comments:  No specialty comments available.  Imaging: No results found.   PMFS History: Patient Active Problem List   Diagnosis Date Noted  . Skin lesions, generalized 12/02/2019  . Recurrent major depressive disorder, in partial remission ([place]) 09/23/2019  . MDD (major depressive disorder), recurrent episode, moderate ([place]) 07/17/2019  . Generalized anxiety disorder 07/17/2019  . Paranoia ([place]) 05/26/2019  . Chronic left-sided low back pain with left-sided sciatica 01/01/2019  . Numbness and tingling of left lower extremity  01/01/2019  . Iron deficiency anemia 11/09/2018  . Thrombocytosis 11/03/2018  . COPD with chronic bronchitis and emphysema ([place]) 10/29/2018  . Cough 10/23/2018  . Chest congestion 10/23/2018  . Anxiety 10/23/2018  . Anemia 10/23/2018  . RLL pneumonia 10/13/2018  . Elevated blood pressure reading 10/13/2018  . Obesity (BMI 30.0-34.9) 10/13/2018  . Vitamin D deficiency 10/2018   Past Medical History:  Diagnosis Date  . Allergy    seasonal  . Anemia   . Anxiety   . CAP (community acquired pneumonia) 10/2018  . Cough 10/2018  . Hyperlipidemia 10/2018  . Insomnia 10/2018  . Microcytic anemia   . Skin lesions 10/2019  . Thrombocytosis   . Vitamin D deficiency 10/2018    Family History  Problem Relation Age of Onset  . Alcohol abuse Mother   . COPD Mother   . Hypertension Mother   . Alcohol abuse Father   . Diabetes Father   . Drug abuse Father   . Heart disease Father   . Hypertension Father   . Kidney disease Father   . Arthritis Maternal Grandmother   . Arthritis Maternal Grandfather   . Melanoma Maternal Grandfather   . Cancer Paternal Grandmother   . Diabetes Paternal Grandmother   . Heart disease Paternal Grandmother   . Diabetes Paternal Grandfather   . Heart disease Paternal Grandfather   . Kidney disease Paternal Grandfather   . Stroke Paternal Grandfather     Past Surgical History:  Procedure Laterality Date  . APPENDECTOMY  1991  . CHOLECYSTECTOMY  2003  . TUBAL LIGATION  2003   Social History   Occupational History  . Not on file  Tobacco Use  . Smoking status: Former Smoker    Packs/day: 0.25    Types: Cigarettes    Quit date: 10/01/2018    Years since quitting: 1.3  . Smokeless tobacco: Never Used  . Tobacco comment: 5-7 ciggs per day  Vaping Use  . Vaping Use: Never used  Substance and Sexual Activity  . Alcohol use: Yes    Alcohol/week: 0.0 standard drinks    Comment: rare  . Drug use: No  . Sexual activity: Yes    Partners: Male     Birth control/protection: None

## 2020-02-18 ENCOUNTER — Telehealth: Payer: Self-pay | Admitting: Orthopaedic Surgery

## 2020-02-18 ENCOUNTER — Other Ambulatory Visit: Payer: Self-pay | Admitting: Orthopaedic Surgery

## 2020-02-18 NOTE — Telephone Encounter (Signed)
noted 

## 2020-02-18 NOTE — Telephone Encounter (Signed)
Please advise 

## 2020-02-18 NOTE — Telephone Encounter (Signed)
I called and left message. We will discuss at [facility] after MRI. Already had 2 Rx for narcotics from PCP in August.

## 2020-02-18 NOTE — Telephone Encounter (Signed)
Patient called. She says the pain is worst. Would like to know what she could do. She has been taking OTC pain pills.  Her call back number is [phone]

## 2020-02-21 ENCOUNTER — Ambulatory Visit: Payer: Self-pay | Admitting: Family Medicine

## 2020-02-24 ENCOUNTER — Ambulatory Visit (HOSPITAL_COMMUNITY): Payer: 59 | Admitting: Psychiatry

## 2020-02-24 ENCOUNTER — Other Ambulatory Visit: Payer: Self-pay

## 2020-02-24 ENCOUNTER — Encounter (HOSPITAL_COMMUNITY): Payer: Self-pay | Admitting: Psychiatry

## 2020-02-24 DIAGNOSIS — F3341 Major depressive disorder, recurrent, in partial remission: Secondary | ICD-10-CM

## 2020-02-24 NOTE — Progress Notes (Signed)
Counselor was contacted by Client who stated that she would not be able to attend today's session due to her daughter being called in for emergency surgery. Client requested that we reschedule. Counselor offered empathetic support and we worked to reschedule appointment and set future appointments.

## 2020-02-29 NOTE — Progress Notes (Signed)
[facility]  [phone] [phone]  HEMATOLOGY-ONCOLOGY TELEMEDICINE VISIT PROGRESS NOTE  ID: Erin Zamora OB: 10/14/1970  MR#: [id number]  OEU#:[id number]  Patient Care Team: Azzie Glatter, FNP as PCP - General (Family Medicine) Lloyd Huger, MD as Consulting Physician (Oncology)   CHIEF COMPLAINT: Thrombocytosis, iron deficiency anemia.  INTERVAL HISTORY: Patient returns to clinic today for repeat laboratory work, further evaluation, and continuation of IV Venofer. She has chronic back pain and has an MRI scheduled for tomorrow. She otherwise feels well. She does not complain of any weakness or fatigue today. She has no neurologic complaints.  She denies any recent fevers or illnesses.  She has a good appetite and denies weight loss.  She has no chest pain, shortness of breath, cough, or hemoptysis.  She denies any nausea, vomiting, constipation, or diarrhea.  She denies any melena or hematochezia.  She has no urinary complaints. Patient offers no further specific complaints today.  REVIEW OF SYSTEMS:   Review of Systems  Constitutional: Negative.  Negative for fever, malaise/fatigue and weight loss.  Respiratory: Negative.  Negative for cough, hemoptysis and shortness of breath.   Cardiovascular: Negative.  Negative for chest pain and leg swelling.  Gastrointestinal: Negative.  Negative for abdominal pain.  Genitourinary: Negative.  Negative for dysuria.  Musculoskeletal: Positive for back pain.  Skin: Negative.  Negative for rash.  Neurological: Negative.  Negative for dizziness, focal weakness, weakness and headaches.  Psychiatric/Behavioral: Negative.  The patient is not nervous/anxious.     As per HPI. Otherwise, a complete review of systems is negative.  PAST MEDICAL HISTORY: Past Medical History:  Diagnosis Date  . Allergy    seasonal  . Anemia   . Anxiety   . CAP (community acquired pneumonia) 10/2018  .  Cough 10/2018  . Hyperlipidemia 10/2018  . Insomnia 10/2018  . Microcytic anemia   . Skin lesions 10/2019  . Thrombocytosis   . Vitamin D deficiency 10/2018    PAST SURGICAL HISTORY: Past Surgical History:  Procedure Laterality Date  . APPENDECTOMY  1991  . CHOLECYSTECTOMY  2003  . TUBAL LIGATION  2003    FAMILY HISTORY: Family History  Problem Relation Age of Onset  . Alcohol abuse Mother   . COPD Mother   . Hypertension Mother   . Alcohol abuse Father   . Diabetes Father   . Drug abuse Father   . Heart disease Father   . Hypertension Father   . Kidney disease Father   . Arthritis Maternal Grandmother   . Arthritis Maternal Grandfather   . Melanoma Maternal Grandfather   . Cancer Paternal Grandmother   . Diabetes Paternal Grandmother   . Heart disease Paternal Grandmother   . Diabetes Paternal Grandfather   . Heart disease Paternal Grandfather   . Kidney disease Paternal Grandfather   . Stroke Paternal Grandfather     ADVANCED DIRECTIVES (Y/N):  N  HEALTH MAINTENANCE: Social History   Tobacco Use  . Smoking status: Former Smoker    Packs/day: 0.25    Types: Cigarettes    Quit date: 10/01/2018    Years since quitting: 1.4  . Smokeless tobacco: Never Used  . Tobacco comment: 5-7 ciggs per day  Vaping Use  . Vaping Use: Never used  Substance Use Topics  . Alcohol use: Yes    Alcohol/week: 0.0 standard drinks    Comment: rare  . Drug use: No     Colonoscopy:  PAP:  Bone density:  Lipid panel:  Allergies  Allergen Reactions  . Codeine Itching and Nausea And Vomiting    Current Outpatient Medications  Medication Sig Dispense Refill  . acetaminophen (TYLENOL) 500 MG tablet Take 2 tablets (1,000 mg), by mouth, 2 times a day. 120 tablet 3  . gabapentin (NEURONTIN) 300 MG capsule Take 1 capsule (300 mg total) by mouth 3 (three) times daily. 90 capsule 3  . ibuprofen (ADVIL) 800 MG tablet TAKE 1 TABLET BY MOUTH EVERY 8 HOURS AS NEEDED FOR MODERATE PAIN  30 tablet 0  . sertraline (ZOLOFT) 100 MG tablet Take 1 tablet (100 mg total) by mouth daily. 90 tablet 1  . Vitamin D, Ergocalciferol, (DRISDOL) 1.25 MG (50000 UT) CAPS capsule Take 1 capsule (50,000 Units total) by mouth every 7 (seven) days. 5 capsule 6  . Glycopyrrolate-Formoterol (BEVESPI AEROSPHERE) 9-4.8 MCG/ACT AERO Inhale 2 puffs into the lungs 2 (two) times a day. (Patient not taking: Reported on 11/22/2019) 10.7 g 5   No current facility-administered medications for this visit.    OBJECTIVE: Vitals:   03/02/20 1427  BP: (!) 148/89  Pulse: 79  Resp: 20  Temp: 98 F (36.7 C)     Body mass index is 41.12 kg/m.    ECOG FS:0 - Asymptomatic  General: Well-developed, well-nourished, no acute distress. Eyes: Pink conjunctiva, anicteric sclera. HEENT: Normocephalic, moist mucous membranes. Lungs: No audible wheezing or coughing. Heart: Regular rate and rhythm. Abdomen: Soft, nontender, no obvious distention. Musculoskeletal: No edema, cyanosis, or clubbing. Neuro: Alert, answering all questions appropriately. Cranial nerves grossly intact. Skin: No rashes or petechiae noted. Psych: Normal affect.  LAB RESULTS:  Lab Results  Component Value Date   NA 138 10/15/2018   K 4.1 10/15/2018   CL 105 10/15/2018   CO2 24 10/15/2018   GLUCOSE 128 (H) 10/15/2018   BUN 8 10/15/2018   CREATININE 0.76 10/15/2018   CALCIUM 9.2 10/15/2018   PROT 6.8 10/23/2018   ALBUMIN 4.6 10/23/2018   AST 13 10/23/2018   ALT 17 10/23/2018   ALKPHOS 66 10/23/2018   BILITOT 0.5 10/23/2018   GFRNONAA >60 10/15/2018   GFRAA >60 10/15/2018    Lab Results  Component Value Date   WBC 10.4 03/02/2020   NEUTROABS 7.0 03/02/2020   HGB 13.4 03/02/2020   HCT 39.8 03/02/2020   MCV 84.0 03/02/2020   PLT 472 (H) 03/02/2020   Lab Results  Component Value Date   IRON 64 03/02/2020   TIBC 479 (H) 03/02/2020   IRONPCTSAT 13 03/02/2020   Lab Results  Component Value Date   FERRITIN 18 03/02/2020      STUDIES: US THYROID  Result Date: 02/07/2020 CLINICAL DATA:  Palpable abnormality. Palpable abnormality involving the anterior aspect the right-side of the neck for the past 3 weeks. Difficulty swallowing. Frequent choking episodes. EXAM: THYROID ULTRASOUND TECHNIQUE: Ultrasound examination of the thyroid gland and adjacent soft tissues was performed. COMPARISON:  None. FINDINGS: Parenchymal Echotexture: Mildly heterogenous Isthmus: Normal in size measuring 0.4 cm in diameter Right lobe: Normal in size measuring 4.3 x 1.5 x 1.7 cm Left lobe: Normal in size measuring 4.7 x 1.5 x 1.3 cm _________________________________________________________ Estimated total number of nodules >/= 1 cm: 0 Number of spongiform nodules >/=  2 cm not described below (TR1): 0 Number of mixed cystic and solid nodules >/= 1.5 cm not described below (TR2): 0 _________________________________________________________ No discrete nodules are seen within the thyroid gland. There is no sonographic correlate for patient's palpable area of concern involving  the right-side of the neck. Specifically, no discrete solid or cystic lesions. No regional cervical lymphadenopathy. Adjacent visualized cervical vasculature appears normal and widely patent. IMPRESSION: 1. Mildly heterogeneous but normal sized thyroid gland without discrete nodule or mass. 2. No sonographic correlate for patient's palpable area of concern involving the right-side of the neck. Electronically Signed   By: Sandi Mariscal M.D.   On: 02/07/2020 14:27   XR Cervical Spine 2 or 3 views  Result Date: 02/18/2020 AP lateral cervical spine images are obtained and reviewed.  This shows no scoliosis.  Mild reversal of normal curvature on lateral image without significant disc space narrowing.  Minimal uncovertebral changes mid cervical. Impression: Cervical spine images negative for acute changes.  Nonspecific mild reversal of normal curvature.  XR Lumbar Spine 2-3  Views  Result Date: 02/18/2020 AP lateral lumbar spine x-rays are obtained including lateral flexion-extension x-rays.  This note is no spondylolisthesis no instability. Impression: Lumbar spine x-rays negative for acute changes comparison to CT scan 12/03/2019.   ASSESSMENT: Thrombocytosis, iron deficiency anemia.  PLAN:  1.  Iron deficiency anemia: Patient's hemoglobin and iron stores are now within normal limits and patient is asymptomatic. She does not require additional Venofer today. She last received treatment on 12/12/2019. No intervention is needed at this time. We once again discussed the possibility of a screening colonoscopy, but patient wishes to delay until her back issues have resolved. Return to clinic in 4 months for repeat laboratory work, further evaluation, and continuation of treatment if needed. 2. Thrombocytosis: Chronic and unchanged. Previously, JAK-2 mutation was negative. 3. Back pain: Patient has an MRI scheduled for tomorrow for further evaluation.   Patient expressed understanding that she can return to clinic at any time if she has any questions, concerns, or complaints.   Lloyd Huger, MD 03/03/2020 6:45 AM

## 2020-03-02 ENCOUNTER — Other Ambulatory Visit: Payer: Self-pay

## 2020-03-02 ENCOUNTER — Inpatient Hospital Stay: Payer: 59

## 2020-03-02 ENCOUNTER — Inpatient Hospital Stay (HOSPITAL_BASED_OUTPATIENT_CLINIC_OR_DEPARTMENT_OTHER): Payer: 59 | Admitting: Oncology

## 2020-03-02 ENCOUNTER — Encounter: Payer: Self-pay | Admitting: Oncology

## 2020-03-02 ENCOUNTER — Inpatient Hospital Stay: Payer: 59 | Attending: Oncology

## 2020-03-02 VITALS — BP 148/89 | HR 79 | Temp 98.0°F | Resp 20 | Wt 247.1 lb

## 2020-03-02 DIAGNOSIS — D75839 Thrombocytosis, unspecified: Secondary | ICD-10-CM | POA: Diagnosis not present

## 2020-03-02 DIAGNOSIS — F419 Anxiety disorder, unspecified: Secondary | ICD-10-CM | POA: Diagnosis not present

## 2020-03-02 DIAGNOSIS — Z791 Long term (current) use of non-steroidal anti-inflammatories (NSAID): Secondary | ICD-10-CM | POA: Insufficient documentation

## 2020-03-02 DIAGNOSIS — Z79899 Other long term (current) drug therapy: Secondary | ICD-10-CM | POA: Diagnosis not present

## 2020-03-02 DIAGNOSIS — E559 Vitamin D deficiency, unspecified: Secondary | ICD-10-CM | POA: Diagnosis not present

## 2020-03-02 DIAGNOSIS — D509 Iron deficiency anemia, unspecified: Secondary | ICD-10-CM | POA: Diagnosis present

## 2020-03-02 DIAGNOSIS — G8929 Other chronic pain: Secondary | ICD-10-CM | POA: Insufficient documentation

## 2020-03-02 DIAGNOSIS — Z87891 Personal history of nicotine dependence: Secondary | ICD-10-CM | POA: Diagnosis not present

## 2020-03-02 DIAGNOSIS — M549 Dorsalgia, unspecified: Secondary | ICD-10-CM | POA: Insufficient documentation

## 2020-03-02 DIAGNOSIS — E785 Hyperlipidemia, unspecified: Secondary | ICD-10-CM | POA: Diagnosis not present

## 2020-03-02 LAB — CBC WITH DIFFERENTIAL/PLATELET
Abs Immature Granulocytes: 0.11 10*3/uL — ABNORMAL HIGH (ref 0.00–0.07)
Basophils Absolute: 0.1 10*3/uL (ref 0.0–0.1)
Basophils Relative: 1 %
Eosinophils Absolute: 0.3 10*3/uL (ref 0.0–0.5)
Eosinophils Relative: 3 %
HCT: 39.8 % (ref 36.0–46.0)
Hemoglobin: 13.4 g/dL (ref 12.0–15.0)
Immature Granulocytes: 1 %
Lymphocytes Relative: 21 %
Lymphs Abs: 2.2 10*3/uL (ref 0.7–4.0)
MCH: 28.3 pg (ref 26.0–34.0)
MCHC: 33.7 g/dL (ref 30.0–36.0)
MCV: 84 fL (ref 80.0–100.0)
Monocytes Absolute: 0.7 10*3/uL (ref 0.1–1.0)
Monocytes Relative: 7 %
Neutro Abs: 7 10*3/uL (ref 1.7–7.7)
Neutrophils Relative %: 67 %
Platelets: 472 10*3/uL — ABNORMAL HIGH (ref 150–400)
RBC: 4.74 MIL/uL (ref 3.87–5.11)
RDW: 14.4 % (ref 11.5–15.5)
WBC: 10.4 10*3/uL (ref 4.0–10.5)
nRBC: 0 % (ref 0.0–0.2)

## 2020-03-02 LAB — IRON AND TIBC
Iron: 64 ug/dL (ref 28–170)
Saturation Ratios: 13 % (ref 10.4–31.8)
TIBC: 479 ug/dL — ABNORMAL HIGH (ref 250–450)
UIBC: 415 ug/dL

## 2020-03-02 LAB — FERRITIN: Ferritin: 18 ng/mL (ref 11–307)

## 2020-03-02 NOTE — Progress Notes (Signed)
Patient denies any concerns today.  

## 2020-03-03 ENCOUNTER — Ambulatory Visit
Admission: RE | Admit: 2020-03-03 | Discharge: 2020-03-03 | Disposition: A | Discharge status: Home / Self Care | Payer: 59 | Source: Ambulatory Visit | Attending: Orthopaedic Surgery | Admitting: Orthopaedic Surgery

## 2020-03-03 DIAGNOSIS — G8929 Other chronic pain: Secondary | ICD-10-CM

## 2020-03-03 DIAGNOSIS — M4807 Spinal stenosis, lumbosacral region: Secondary | ICD-10-CM

## 2020-03-04 ENCOUNTER — Telehealth: Payer: Self-pay | Admitting: Orthopaedic Surgery

## 2020-03-04 MED ORDER — DIAZEPAM 5 MG PO TABS
ORAL_TABLET | ORAL | 0 refills | Status: DC
Start: 2020-03-04 — End: 2020-05-04

## 2020-03-04 NOTE — Telephone Encounter (Signed)
FYI  I called valium to pharmacy for premed prior to MRI. I called patient and left voicemail advising.

## 2020-03-04 NOTE — Telephone Encounter (Signed)
Patient called advised she could not have the MRI because she is claustrophobic and will need medication when she reschedule it. Patient uses the Paediatric nurse at [facility].  Ph# is [phone]  The number to contact patient is [phone]

## 2020-03-06 ENCOUNTER — Ambulatory Visit: Payer: 59 | Admitting: Orthopaedic Surgery

## 2020-03-18 ENCOUNTER — Other Ambulatory Visit: Payer: Self-pay

## 2020-03-18 ENCOUNTER — Ambulatory Visit (INDEPENDENT_AMBULATORY_CARE_PROVIDER_SITE_OTHER): Payer: 59 | Admitting: Psychiatry

## 2020-03-18 ENCOUNTER — Encounter (HOSPITAL_COMMUNITY): Payer: Self-pay | Admitting: Psychiatry

## 2020-03-18 DIAGNOSIS — F3341 Major depressive disorder, recurrent, in partial remission: Secondary | ICD-10-CM

## 2020-03-18 DIAGNOSIS — F411 Generalized anxiety disorder: Secondary | ICD-10-CM | POA: Diagnosis not present

## 2020-03-18 NOTE — Progress Notes (Signed)
Virtual Visit via Video Note  I connected with [name] on 03/18/20 at  1:30 PM EST by a video enabled telemedicine application and verified that I am speaking with the correct person using two identifiers.  Location: Patient: Patient Home Provider: Home Office  I discussed the limitations of evaluation and management by telemedicine and the availability of in person appointments. The patient expressed understanding and agreed to proceed.  History of Present Illness: GAD  Treatment Plan Goals: 1)Shakelia would like to reduce the impact of work-related stress by improving boundary setting, communicating needs and applying coping strategies during work hours. 2)Ragan would like to reduce paranoia, depressive/anxiety symptoms by addressing cognitive distortions through learning and applying CBT skills/interventions. 3)Austina feels disconnected from self and would like to explore her identity, interest and preferences to create a better work-life balance to alleviate stress and anxiety.  Observations/Objective: Counselor met with Client for individual therapy via Webex. Counselor assessed MH symptoms and progress on treatment plan goals, with patient reporting that despite many challenges over the past 2 months, she has been actively applying skills and concepts discussed in session. Client presents with moderate depression and moderate anxiety. Client denied suicidal ideation or self-harm behaviors.   Goal 1: Counselor assessed and processed progress toward goal, using MI skills. Client noted that there have been changes to her role on the job, which have negatively impacted her livelihood and family wellbeing. Client processed and identified options and considerations to improve her situation. Client noted challenges with core and family beliefs that make moving forward difficult for her. Counselor used CBT skills to acknowledge Client's thoughts, feelings and behaviors.    Goal 2: When processing work challenges Client noted efforts to apply CBT skills of examining the evidence to confront her thoughts of paranoia. Client learning ways to distract from troubling thoughts. Counselor praised [employer] and willingness to apply skills appropriately.   Goal 3: Counselor prompted Client to name ways she is applying self-care spontaneously and routinely. Client able to note a variety of behavior changes within relation to her interactions with her family. Client reported positive impacts for both her and her family. Client identified control and guilt issues that are associated with her application, which we will continue to monitor in future sessions.   Assessment and Plan: Counselor will continue to meet with patient to address treatment plan goals. Patient will continue to follow recommendations of providers and implement skills learned in session.  Follow Up Instructions: Counselor will send information for next session via Webex.   The patient was advised to call back or seek an in-person evaluation if the symptoms worsen or if the condition fails to improve as anticipated.  I provided 55 minutes of non-face-to-face time during this encounter.   Lise Auer, LCSW

## 2020-03-23 ENCOUNTER — Other Ambulatory Visit: Payer: Self-pay

## 2020-03-23 ENCOUNTER — Ambulatory Visit
Admission: RE | Admit: 2020-03-23 | Discharge: 2020-03-23 | Disposition: A | Discharge status: Home / Self Care | Payer: 59 | Source: Ambulatory Visit | Attending: Orthopaedic Surgery | Admitting: Orthopaedic Surgery

## 2020-03-25 ENCOUNTER — Ambulatory Visit (INDEPENDENT_AMBULATORY_CARE_PROVIDER_SITE_OTHER): Payer: 59 | Admitting: Orthopaedic Surgery

## 2020-03-25 ENCOUNTER — Other Ambulatory Visit: Payer: Self-pay

## 2020-03-25 ENCOUNTER — Encounter: Payer: Self-pay | Admitting: Orthopaedic Surgery

## 2020-03-25 DIAGNOSIS — M48062 Spinal stenosis, lumbar region with neurogenic claudication: Secondary | ICD-10-CM

## 2020-03-25 DIAGNOSIS — M5126 Other intervertebral disc displacement, lumbar region: Secondary | ICD-10-CM

## 2020-03-25 DIAGNOSIS — M48061 Spinal stenosis, lumbar region without neurogenic claudication: Secondary | ICD-10-CM | POA: Insufficient documentation

## 2020-03-25 NOTE — Progress Notes (Signed)
Office Visit Note   Patient: Erin Zamora           Date of Birth: 02-29-1971           MRN: [id number] Visit Date: 03/25/2020              Requested by: Azzie Glatter, [place],  [place] [id number] PCP: Azzie Glatter, FNP   Assessment & Plan: Visit Diagnoses:  1. Protrusion of lumbar intervertebral disc   2. Spinal stenosis of lumbar region with neurogenic claudication     Plan: We will set patient up for single epidural injection at the L4-5 level. She has multilevel disc protrusion some on the left upper lumbar levels and thoracolumbar junction But her most significant area compression is at the L4-5 level where she has broad-based disc protrusion and mild central stenosis with congenital short pedicles and thick ligaments causing multifactorial spinal stenosis. We will set up for single epidural see how she does. She will talk with her PCP about her thyroid results. We discussed weight loss she needs to get below BMI 40 which is losing at least 7 pounds. She is to be around 212 when she like to get back down to that level and is going to start a diet. Follow-Up Instructions: No follow-ups on file.   Orders:  No orders of the defined types were placed in this encounter.  No orders of the defined types were placed in this encounter.     Procedures: No procedures performed   Clinical Data: No additional findings.   Subjective: Chief Complaint  Patient presents with  . 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 She notes that she gained some weight from 212 lbs to 247lbs. patient says she had some thyroid labs that were sent results not in epic at this point. She is used ibuprofen without relief. She does better if she leans over a grocery  cart she not able to stand more than short period time she has problems with walking half stops at rest and then repeat. No associated bowel or bladder symptoms. Last A1c was 5.94 months ago.  Review of Systems all other systems updated noncontributory other than as mentioned in HPI.   Objective: Vital Signs: Ht 5\' 5"  (1.651 m)   Wt 247 lb (112 kg)   BMI 41.10 kg/m   Physical Exam Constitutional:      Appearance: She is well-developed.  HENT:     Head: Normocephalic.     Right Ear: External ear normal.     Left Ear: External ear normal.  Eyes:     Pupils: Pupils are equal, round, and reactive to light.  Neck:     Thyroid: No thyromegaly.     Trachea: No tracheal deviation.  Cardiovascular:     Rate and Rhythm: Normal rate.  Pulmonary:     Effort: Pulmonary effort is normal.  Abdominal:     Palpations: Abdomen is soft.  Skin:    General: Skin is warm and dry.  Neurological:     Mental Status: She is alert and oriented to person, place, and time.  Psychiatric:        Behavior: Behavior normal.     Ortho Exam patient has tenderness along  the lumbosacral junction that pans out the belt distribution over the posterior iliac crest. Negative straight leg raising negative logroll the hips. She is able to heel and toe walk. Good quad strength no lower extremity atrophy no pitting edema. Knee and ankle jerk are intact she has some problems with figure 4 positioning. Specialty Comments:  No specialty comments available.  Imaging: CLINICAL DATA:  Chronic low back pain.  Left leg pain and numbness.  EXAM: MRI LUMBAR SPINE WITHOUT CONTRAST  TECHNIQUE: Multiplanar, multisequence MR imaging of the lumbar spine was performed. No intravenous contrast was administered.  COMPARISON:  Lumbar spine CT 12/03/2019  FINDINGS: Segmentation:  Standard.  Alignment: Slight left convex curvature of the lumbar spine. No significant listhesis.  Vertebrae: No fracture, suspicious  osseous lesion, or significant marrow edema.  Conus medullaris and cauda equina: Conus extends to the T12-L1 level. Conus and cauda equina appear normal.  Paraspinal and other soft tissues: 1 cm T2 hyperintense focus in the right kidney, likely a cyst.  Disc levels:  Disc desiccation and mild disc space narrowing from L2-3 to L4-5.  T12-L1: Moderate-sized left paracentral disc protrusion without significant stenosis.  L1-2: Small to moderate-sized central and left paracentral disc extrusion without stenosis.  L2-3: A moderate-sized left foraminal and extraforaminal disc protrusion results in moderate left neural foraminal stenosis and potential left L2 nerve impingement. No spinal stenosis.  L3-4: A moderate-sized left foraminal and extraforaminal disc protrusion results in mild left neural foraminal stenosis and potential left L3 nerve impingement in the extraforaminal region.  L4-5: Disc bulging, a more focal right paracentral disc protrusion, congenitally short pedicles, and mild facet and ligamentum flavum hypertrophy result in mild spinal stenosis, moderate right greater than left lateral recess stenosis, and mild-to-moderate right and mild left neural foraminal stenosis. Potential bilateral L5 nerve root impingement in the lateral recesses, particularly on the right.  L5-S1: Negative.  IMPRESSION: 1. Left foraminal and extraforaminal disc protrusions at L2-3 and L3-4 with potential L2 and L3 nerve impingement. 2. Moderate bilateral lateral recess stenosis at L4-5 with potential L5 nerve root impingement. 3. Disc herniations at T12-L1 and L1-2 without stenosis.   Electronically Signed   By: Logan Bores M.D.   On: 03/24/2020 05:16    PMFS History: Patient Active Problem List   Diagnosis Date Noted  . Protrusion of lumbar intervertebral disc 03/25/2020  . Spinal stenosis of lumbar region 03/25/2020  . Skin lesions, generalized 12/02/2019  .  Recurrent major depressive disorder, in partial remission ([place]) 09/23/2019  . MDD (major depressive disorder), recurrent episode, moderate ([place]) 07/17/2019  . Generalized anxiety disorder 07/17/2019  . Paranoia ([place]) 05/26/2019  . Chronic left-sided low back pain with left-sided sciatica 01/01/2019  . Numbness and tingling of left lower extremity 01/01/2019  . Iron deficiency anemia 11/09/2018  . Thrombocytosis 11/03/2018  . COPD with chronic bronchitis and emphysema ([place]) 10/29/2018  . Cough 10/23/2018  . Chest congestion 10/23/2018  . Anxiety 10/23/2018  . Anemia 10/23/2018  . RLL pneumonia 10/13/2018  . Elevated blood pressure reading 10/13/2018  . Obesity (BMI 30.0-34.9) 10/13/2018  . Vitamin D deficiency 10/2018   Past Medical History:  Diagnosis Date  . Allergy    seasonal  . Anemia   . Anxiety   . CAP (community acquired pneumonia) 10/2018  . Cough 10/2018  . Hyperlipidemia 10/2018  . Insomnia 10/2018  . Microcytic anemia   . Skin lesions 10/2019  . Thrombocytosis   . Vitamin D deficiency 10/2018  Family History  Problem Relation Age of Onset  . Alcohol abuse Mother   . COPD Mother   . Hypertension Mother   . Alcohol abuse Father   . Diabetes Father   . Drug abuse Father   . Heart disease Father   . Hypertension Father   . Kidney disease Father   . Arthritis Maternal Grandmother   . Arthritis Maternal Grandfather   . Melanoma Maternal Grandfather   . Cancer Paternal Grandmother   . Diabetes Paternal Grandmother   . Heart disease Paternal Grandmother   . Diabetes Paternal Grandfather   . Heart disease Paternal Grandfather   . Kidney disease Paternal Grandfather   . Stroke Paternal Grandfather     Past Surgical History:  Procedure Laterality Date  . APPENDECTOMY  1991  . CHOLECYSTECTOMY  2003  . TUBAL LIGATION  2003   Social History   Occupational History  . Not on file  Tobacco Use  . Smoking status: Former Smoker    Packs/day: 0.25     Types: Cigarettes    Quit date: 10/01/2018    Years since quitting: 1.4  . Smokeless tobacco: Never Used  . Tobacco comment: 5-7 ciggs per day  Vaping Use  . Vaping Use: Never used  Substance and Sexual Activity  . Alcohol use: Yes    Alcohol/week: 0.0 standard drinks    Comment: rare  . Drug use: No  . Sexual activity: Yes    Partners: Male    Birth control/protection: None

## 2020-03-31 ENCOUNTER — Telehealth (HOSPITAL_COMMUNITY): Payer: 59 | Admitting: Psychiatry

## 2020-04-09 ENCOUNTER — Ambulatory Visit (HOSPITAL_COMMUNITY): Payer: 59 | Admitting: Psychiatry

## 2020-04-14 ENCOUNTER — Other Ambulatory Visit: Payer: Self-pay

## 2020-04-14 ENCOUNTER — Telehealth (INDEPENDENT_AMBULATORY_CARE_PROVIDER_SITE_OTHER): Payer: 59 | Admitting: Psychiatry

## 2020-04-14 ENCOUNTER — Encounter (HOSPITAL_COMMUNITY): Payer: Self-pay | Admitting: Psychiatry

## 2020-04-14 DIAGNOSIS — F3342 Major depressive disorder, recurrent, in full remission: Secondary | ICD-10-CM | POA: Diagnosis not present

## 2020-04-14 DIAGNOSIS — F411 Generalized anxiety disorder: Secondary | ICD-10-CM | POA: Diagnosis not present

## 2020-04-14 MED ORDER — HYDROXYZINE HCL 10 MG PO TABS: 10.0000 mg | ORAL_TABLET | Freq: Two times a day (BID) | ORAL | 1 refills | Status: DC | PRN

## 2020-04-14 MED ORDER — SERTRALINE HCL 100 MG PO TABS: 100.0000 mg | ORAL_TABLET | Freq: Every day | ORAL | 0 refills | Status: DC

## 2020-04-14 NOTE — Progress Notes (Signed)
BH MD/PA/NP OP Progress Note  Virtual Visit via Video Note  I connected with Erin Zamora on 04/14/20 at 10:40 AM EST by a video enabled telemedicine application and verified that I am speaking with the correct person using two identifiers.  Location: Patient: Home Provider: Clinic   I discussed the limitations of evaluation and management by telemedicine and the availability of in person appointments. The patient expressed understanding and agreed to proceed.  I provided 19 minutes of non-face-to-face time during this encounter.    04/14/2020 11:55 AM Erin Zamora  MRN:  [id number]  Chief Complaint: " I am still dealing with a lot of stuff."   HPI: Patient informed that her depression symptoms are in remission.  However she continues to have some outbursts of anxiety that are triggered easily by stress.  She stated that she is not as worried and depressed as she used to be. She stated that she has been dreading going to work lately because she feels unappreciated.  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  This was done because she could not pick up heavy weights like tires at work anymore.  She had to take a pay cut and had to be reduced to hourly salary.  Lately she feels that she is just stuck in her room where she does not have to deal with a lot of people but at the same time she does not feel appreciated. She also has developed severe back issues and is working with the orthopedic surgeon right now.  She has to undergo MRI and other investigations.  She did not get a chance to get her thyroid panel drawn. She stated that her children continue to make her feel irritated and anxious at times.  She feels overwhelmed and her children state that she is always playing a victim card. She is thinking about switching to a different job however wants to make sure that she has good medical insurance coverage.  One of  her colleagues who works in the call center at nights has offered her to move into his position with the same company.  She stated that she is contemplating about going into that position where in she will be able to work from home.  She has a lot stress due to dissatisfaction at job and some conflictual relationships with her children.  She stated that she is thankful that she has a therapist who she can talk to.  Writer asked if she tried the as needed dose of hydroxyzine for anxiety.  She replied that she actually did not because her daughter informed her that she is taking hydroxyzine and it made her feel very sleepy and drowsy. Writer advised the patient to try it in the evening or on days when she is off so that she can see if the low-dose of hydroxyzine that she has been prescribed makes her drowsy or not.  Patient stated that she will try it.  She does not have any difficulty with sleep and she feels that she sleeps a lot.    Visit Diagnosis:    ICD-10-CM   1. Recurrent major depressive disorder, in partial remission ([facility])  F33.41   2. Generalized anxiety disorder  F41.1     Past Psychiatric History: MDD, anxiety  Past Medical History:  Past Medical History:  Diagnosis Date  . Allergy    seasonal  . Anemia   . Anxiety   .  CAP (community acquired pneumonia) 10/2018  . Cough 10/2018  . Hyperlipidemia 10/2018  . Insomnia 10/2018  . Microcytic anemia   . Skin lesions 10/2019  . Thrombocytosis   . Vitamin D deficiency 10/2018    Past Surgical History:  Procedure Laterality Date  . APPENDECTOMY  1991  . CHOLECYSTECTOMY  2003  . TUBAL LIGATION  2003    Family Psychiatric History: see below  Family History:  Family History  Problem Relation Age of Onset  . Alcohol abuse Mother   . COPD Mother   . Hypertension Mother   . Alcohol abuse Father   . Diabetes Father   . Drug abuse Father   . Heart disease Father   . Hypertension Father   . Kidney disease Father   .  Arthritis Maternal Grandmother   . Arthritis Maternal Grandfather   . Melanoma Maternal Grandfather   . Cancer Paternal Grandmother   . Diabetes Paternal Grandmother   . Heart disease Paternal Grandmother   . Diabetes Paternal Grandfather   . Heart disease Paternal Grandfather   . Kidney disease Paternal Grandfather   . Stroke Paternal Grandfather     Social History:  Social History   Socioeconomic History  . Marital status: Single    Spouse name: Not on file  . Number of children: Not on file  . Years of education: Not on file  . Highest education level: Not on file  Occupational History  . Not on file  Tobacco Use  . Smoking status: Former Smoker    Packs/day: 0.25    Types: Cigarettes    Quit date: 10/01/2018    Years since quitting: 1.5  . Smokeless tobacco: Never Used  . Tobacco comment: 5-7 ciggs per day  Vaping Use  . Vaping Use: Never used  Substance and Sexual Activity  . Alcohol use: Yes    Alcohol/week: 0.0 standard drinks    Comment: rare  . Drug use: No  . Sexual activity: Yes    Partners: Male    Birth control/protection: None  Other Topics Concern  . Not on file  Social History Narrative   48 year old and 56 y/o daughters   35 year old granddaughter - takes care of her in the afternoon after school   Significant other - 15 years   Work: Web designer   Social Determinants of Radio broadcast assistant Strain: Not on [employer] Insecurity: Not on file  Transportation Needs: Not on file  Physical Activity: Not on file  Stress: Not on file  Social Connections: Not on file    Allergies:  Allergies  Allergen Reactions  . Codeine Itching and Nausea And Vomiting    Metabolic Disorder Labs: Lab Results  Component Value Date   HGBA1C 5.9 (A) 11/22/2019   HGBA1C 5.9 11/22/2019   HGBA1C 5.9 11/22/2019   HGBA1C 5.9 11/22/2019   No results found for: PROLACTIN Lab Results  Component Value Date   CHOL 168 10/23/2018   TRIG 228 (H)  10/23/2018   HDL 42 10/23/2018   CHOLHDL 4.0 10/23/2018   [place] 80 10/23/2018   Lab Results  Component Value Date   TSH 0.961 10/14/2018    Therapeutic Level Labs: No results found for: LITHIUM No results found for: VALPROATE No components found for:  CBMZ  Current Medications: Current Outpatient Medications  Medication Sig Dispense Refill  . acetaminophen (TYLENOL) 500 MG tablet Take 2 tablets (1,000 mg), by mouth, 2 times a day. [place]  tablet 3  . diazepam (VALIUM) 5 MG tablet Take as directed prior to procedure. (Patient not taking: Reported on 03/25/2020) 3 tablet 0  . gabapentin (NEURONTIN) 300 MG capsule Take 1 capsule (300 mg total) by mouth 3 (three) times daily. 90 capsule 3  . Glycopyrrolate-Formoterol (BEVESPI AEROSPHERE) 9-4.8 MCG/ACT AERO Inhale 2 puffs into the lungs 2 (two) times a day. (Patient not taking: Reported on 11/22/2019) 10.7 g 5  . ibuprofen (ADVIL) 800 MG tablet TAKE 1 TABLET BY MOUTH EVERY 8 HOURS AS NEEDED FOR MODERATE PAIN 30 tablet 0  . sertraline (ZOLOFT) 100 MG tablet Take 1 tablet (100 mg total) by mouth daily. 90 tablet 1  . Vitamin D, Ergocalciferol, (DRISDOL) 1.25 MG (50000 UT) CAPS capsule Take 1 capsule (50,000 Units total) by mouth every 7 (seven) days. 5 capsule 6   No current facility-administered medications for this visit.     Psychiatric Specialty Exam: Review of Systems  There were no vitals taken for this visit.There is no height or weight on file to calculate BMI.  General Appearance: Well Groomed  Eye Contact:  Good  Speech:  Clear and Coherent and Normal Rate  Volume:  Normal  Mood:  Anxious  Affect:  Congruent  Thought Process:  Goal Directed, Linear and Descriptions of Associations: Intact  Orientation:  Full (Time, Place, and Person)  Thought Content: Logical   Suicidal Thoughts:  No  Homicidal Thoughts:  No  Memory:  Recent;   Good Remote;   Good  Judgement:  Good  Insight:  Good  Psychomotor Activity:  Normal   Concentration:  Concentration: Good and Attention Span: Good  Recall:  Good  Fund of Knowledge: Good  Language: Good  Akathisia:  Negative  Handed:  Right  AIMS (if indicated): not done  Assets:  Communication Skills Desire for Improvement Financial Resources/Insurance Housing  ADL's:  Intact  Cognition: WNL  Sleep:  Fair     Screenings: PHQ2-9   [facility] Office Visit from 01/24/2020 in [place] [facility] Office Visit from 01/01/2019 in [facility] Office Visit from 11/20/2018 in [facility] Office Visit from 10/23/2018 in [facility] Office Visit from 12/22/2015 in [facility] at [facility] Total Score 0 0 0 0 0       Assessment and Plan: Patient continues to deal with several stressors.  She continues to report anxiety that usually comes and goes and is triggered by stress.  She did not get a chance to get her thyroid panel done.  She is working on other health related conditions during severe back pain.   1. Recurrent major depressive disorder, in remission ([facility])  - sertraline (ZOLOFT) 100 MG tablet; Take 1 tablet (100 mg total) by mouth daily.  Dispense: 90 tablet; Refill: 1 - Thyroid Panel With TSH  2. Generalized anxiety disorder  - sertraline (ZOLOFT) 100 MG tablet; Take 1 tablet (100 mg total) by mouth daily.  Dispense: 90 tablet; Refill: 1 -Hydroxyzine 10 mg twice daily as needed for anxiety.  Continue same regimen for now Follow-up in 2 months. Continue individual therapy.    [place]ada Crane, MD 04/14/2020, 11:55 AM

## 2020-04-22 ENCOUNTER — Ambulatory Visit: Payer: Self-pay | Admitting: Family Medicine

## 2020-04-30 ENCOUNTER — Other Ambulatory Visit: Payer: Self-pay

## 2020-04-30 ENCOUNTER — Ambulatory Visit (HOSPITAL_COMMUNITY): Payer: 59 | Admitting: Psychiatry

## 2020-05-04 ENCOUNTER — Ambulatory Visit (INDEPENDENT_AMBULATORY_CARE_PROVIDER_SITE_OTHER): Payer: 59 | Admitting: Physical Medicine and Rehabilitation

## 2020-05-04 ENCOUNTER — Ambulatory Visit: Payer: Self-pay

## 2020-05-04 ENCOUNTER — Encounter: Payer: Self-pay | Admitting: Physical Medicine and Rehabilitation

## 2020-05-04 ENCOUNTER — Other Ambulatory Visit: Payer: Self-pay

## 2020-05-04 VITALS — BP 157/99 | HR 67

## 2020-05-04 DIAGNOSIS — M5416 Radiculopathy, lumbar region: Secondary | ICD-10-CM | POA: Diagnosis not present

## 2020-05-04 MED ORDER — BETAMETHASONE SOD PHOS & ACET 6 (3-3) MG/ML IJ SUSP
12.0000 mg | Freq: Once | INTRAMUSCULAR | Status: AC
  Administered 2020-05-04: 12 mg

## 2020-05-04 NOTE — Progress Notes (Signed)
Pt state lower back pain that travels down her left leg. Pt state she feels numbness from her left knee to her ankle and  The top of her foot. Pt state walking, standing and sitting for a long period of time makes the pain worse. Pt state she take [place] meds to help ease the pain.  Numeric Pain Rating Scale and Functional Assessment Average Pain 7   In the last MONTH (on 0-10 scale) has pain interfered with the following?  1. General activity like being  able to carry out your everyday physical activities such as walking, climbing stairs, carrying groceries, or moving a chair?  Rating(9)   +Driver, -BT, -Dye Allergies.

## 2020-05-04 NOTE — Progress Notes (Signed)
Erin Zamora - 50 y.o. female MRN [id number]  Date of birth: [birth year]  Office Visit Note: Visit Date: 05/04/2020 PCP: Kallie Locks, FNP Referred by: Kallie Locks, FNP  Subjective: Chief Complaint  Patient presents with  . Lower Back - Pain  . Left Leg - Pain  . Left Knee - Numbness  . Left Ankle - Numbness   HPI:  Erin Zamora is a 50 y.o. female who comes in today at the request of Dr. Annell Greening for planned Left L4-L5 Lumbar epidural steroid injection with fluoroscopic guidance.  The patient has failed conservative care including home exercise, medications, time and activity modification.  This injection will be diagnostic and hopefully therapeutic.  Please see requesting physician notes for further details and justification.  MRI reviewed with images and spine model.  MRI reviewed in the note below.    ROS Otherwise per HPI.  Assessment & Plan: Visit Diagnoses:    ICD-10-CM   1. Lumbar radiculopathy  M54.16 XR C-ARM NO REPORT    Epidural Steroid injection    betamethasone acetate-betamethasone sodium phosphate (CELESTONE) injection 12 mg    Plan: No additional findings.   Meds & Orders:  Meds ordered this encounter  Medications  . betamethasone acetate-betamethasone sodium phosphate (CELESTONE) injection 12 mg    Orders Placed This Encounter  Procedures  . XR C-ARM NO REPORT  . Epidural Steroid injection    Follow-up: Return for visit to requesting physician as needed.   Procedures: No procedures performed  Lumbar Epidural Steroid Injection - Interlaminar Approach with Fluoroscopic Guidance  Patient: Erin Zamora      Date of Birth: Jan 22, 1971 MRN: [id number] PCP: Kallie Locks, FNP      Visit Date: 05/04/2020   Universal Protocol:     Consent Given By: the patient  Position: PRONE  Additional Comments: Vital signs were monitored before and after the procedure. Patient was prepped and draped in the usual  sterile fashion. The correct patient, procedure, and site was verified.   Injection Procedure Details:   Procedure diagnoses: Lumbar radiculopathy [M54.16]   Meds Administered:  Meds ordered this encounter  Medications  . betamethasone acetate-betamethasone sodium phosphate (CELESTONE) injection 12 mg     Laterality: Left  Location/Site:  L4-L5  Needle: 3.5 in., 20 ga. Tuohy  Needle Placement: Paramedian epidural  Findings:   -Comments: Excellent flow of contrast into the epidural space.  Procedure Details: Using a paramedian approach from the side mentioned above, the region overlying the inferior lamina was localized under fluoroscopic visualization and the soft tissues overlying this structure were infiltrated with 4 ml. of 1% Lidocaine without Epinephrine. The Tuohy needle was inserted into the epidural space using a paramedian approach.   The epidural space was localized using loss of resistance along with counter oblique bi-planar fluoroscopic views.  After negative aspirate for air, blood, and CSF, a 2 ml. volume of Isovue-250 was injected into the epidural space and the flow of contrast was observed. Radiographs were obtained for documentation purposes.    The injectate was administered into the level noted above.   Additional Comments:  The patient tolerated the procedure well Dressing: 2 x 2 sterile gauze and Band-Aid    Post-procedure details: Patient was observed during the procedure. Post-procedure instructions were reviewed.  Patient left the clinic in stable condition.     Clinical History: MRI LUMBAR SPINE WITHOUT CONTRAST  TECHNIQUE: Multiplanar, multisequence MR imaging of the lumbar spine was performed. No intravenous  contrast was administered.  COMPARISON:  Lumbar spine CT 12/03/2019  FINDINGS: Segmentation:  Standard.  Alignment: Slight left convex curvature of the lumbar spine. No significant listhesis.  Vertebrae: No fracture,  suspicious osseous lesion, or significant marrow edema.  Conus medullaris and cauda equina: Conus extends to the T12-L1 level. Conus and cauda equina appear normal.  Paraspinal and other soft tissues: 1 cm T2 hyperintense focus in the right kidney, likely a cyst.  Disc levels:  Disc desiccation and mild disc space narrowing from L2-3 to L4-5.  T12-L1: Moderate-sized left paracentral disc protrusion without significant stenosis.  L1-2: Small to moderate-sized central and left paracentral disc extrusion without stenosis.  L2-3: A moderate-sized left foraminal and extraforaminal disc protrusion results in moderate left neural foraminal stenosis and potential left L2 nerve impingement. No spinal stenosis.  L3-4: A moderate-sized left foraminal and extraforaminal disc protrusion results in mild left neural foraminal stenosis and potential left L3 nerve impingement in the extraforaminal region.  L4-5: Disc bulging, a more focal right paracentral disc protrusion, congenitally short pedicles, and mild facet and ligamentum flavum hypertrophy result in mild spinal stenosis, moderate right greater than left lateral recess stenosis, and mild-to-moderate right and mild left neural foraminal stenosis. Potential bilateral L5 nerve root impingement in the lateral recesses, particularly on the right.  L5-S1: Negative.  IMPRESSION: 1. Left foraminal and extraforaminal disc protrusions at L2-3 and L3-4 with potential L2 and L3 nerve impingement. 2. Moderate bilateral lateral recess stenosis at L4-5 with potential L5 nerve root impingement. 3. Disc herniations at T12-L1 and L1-2 without stenosis.   Electronically Signed   By: Logan Bores M.D.   On: 03/24/2020 05:16     Objective:  VS:  HT:    WT:   BMI:     BP:(!) 157/99  HR:67bpm  TEMP: ( )  RESP:  Physical Exam Vitals and nursing note reviewed.  Constitutional:      General: She is not in acute distress.     Appearance: Normal appearance. She is obese. She is not ill-appearing.  HENT:     Head: Normocephalic and atraumatic.     Right Ear: External ear normal.     Left Ear: External ear normal.  Eyes:     Extraocular Movements: Extraocular movements intact.  Cardiovascular:     Rate and Rhythm: Normal rate.     Pulses: Normal pulses.  Musculoskeletal:     Right lower leg: No edema.     Left lower leg: No edema.     Comments: Patient has good distal strength with no pain over the greater trochanters.  No clonus or focal weakness.  Skin:    Findings: No erythema, lesion or rash.  Neurological:     General: No focal deficit present.     Mental Status: She is alert and oriented to person, place, and time.     Sensory: No sensory deficit.     Motor: No weakness or abnormal muscle tone.     Coordination: Coordination normal.  Psychiatric:        Mood and Affect: Mood normal.        Behavior: Behavior normal.      Imaging: No results found.

## 2020-05-04 NOTE — Procedures (Signed)
Lumbar Epidural Steroid Injection - Interlaminar Approach with Fluoroscopic Guidance  Patient: Erin Zamora      Date of Birth: 1970/07/21 MRN: [id number] PCP: Kallie Locks, FNP      Visit Date: 05/04/2020   Universal Protocol:     Consent Given By: the patient  Position: PRONE  Additional Comments: Vital signs were monitored before and after the procedure. Patient was prepped and draped in the usual sterile fashion. The correct patient, procedure, and site was verified.   Injection Procedure Details:   Procedure diagnoses: Lumbar radiculopathy [M54.16]   Meds Administered:  Meds ordered this encounter  Medications  . betamethasone acetate-betamethasone sodium phosphate (CELESTONE) injection 12 mg     Laterality: Left  Location/Site:  L4-L5  Needle: 3.5 in., 20 ga. Tuohy  Needle Placement: Paramedian epidural  Findings:   -Comments: Excellent flow of contrast into the epidural space.  Procedure Details: Using a paramedian approach from the side mentioned above, the region overlying the inferior lamina was localized under fluoroscopic visualization and the soft tissues overlying this structure were infiltrated with 4 ml. of 1% Lidocaine without Epinephrine. The Tuohy needle was inserted into the epidural space using a paramedian approach.   The epidural space was localized using loss of resistance along with counter oblique bi-planar fluoroscopic views.  After negative aspirate for air, blood, and CSF, a 2 ml. volume of Isovue-250 was injected into the epidural space and the flow of contrast was observed. Radiographs were obtained for documentation purposes.    The injectate was administered into the level noted above.   Additional Comments:  The patient tolerated the procedure well Dressing: 2 x 2 sterile gauze and Band-Aid    Post-procedure details: Patient was observed during the procedure. Post-procedure instructions were reviewed.  Patient  left the clinic in stable condition.

## 2020-05-09 ENCOUNTER — Other Ambulatory Visit: Payer: Self-pay

## 2020-05-09 ENCOUNTER — Emergency Department (HOSPITAL_COMMUNITY)
Admission: EM | Admit: 2020-05-09 | Discharge: 2020-05-10 | Disposition: A | Discharge status: Home / Self Care | Payer: 59 | Attending: Emergency Medicine | Admitting: Emergency Medicine

## 2020-05-09 DIAGNOSIS — U071 COVID-19: Secondary | ICD-10-CM | POA: Diagnosis not present

## 2020-05-09 DIAGNOSIS — Z5321 Procedure and treatment not carried out due to patient leaving prior to being seen by health care provider: Secondary | ICD-10-CM | POA: Diagnosis not present

## 2020-05-09 DIAGNOSIS — J449 Chronic obstructive pulmonary disease, unspecified: Secondary | ICD-10-CM | POA: Insufficient documentation

## 2020-05-09 DIAGNOSIS — R0602 Shortness of breath: Secondary | ICD-10-CM | POA: Diagnosis present

## 2020-05-09 LAB — BASIC METABOLIC PANEL
Anion gap: 14 (ref 5–15)
BUN: 8 mg/dL (ref 6–20)
CO2: 24 mmol/L (ref 22–32)
Calcium: 9 mg/dL (ref 8.9–10.3)
Chloride: 94 mmol/L — ABNORMAL LOW (ref 98–111)
Creatinine, Ser: 0.76 mg/dL (ref 0.44–1.00)
GFR, Estimated: >60 mL/min (ref >60)
Glucose, Bld: 127 mg/dL — ABNORMAL HIGH (ref 70–99)
Potassium: 3.5 mmol/L (ref 3.5–5.1)
Sodium: 132 mmol/L — ABNORMAL LOW (ref 135–145)

## 2020-05-09 LAB — TROPONIN I (HIGH SENSITIVITY): Troponin I (High Sensitivity): 6 ng/L (ref <18)

## 2020-05-09 LAB — CBC
HCT: 43.4 % (ref 36.0–46.0)
Hemoglobin: 14.4 g/dL (ref 12.0–15.0)
MCH: 28.4 pg (ref 26.0–34.0)
MCHC: 33.2 g/dL (ref 30.0–36.0)
MCV: 85.6 fL (ref 80.0–100.0)
Platelets: 517 10*3/uL — ABNORMAL HIGH (ref 150–400)
RBC: 5.07 MIL/uL (ref 3.87–5.11)
RDW: 14.4 % (ref 11.5–15.5)
WBC: 11.2 10*3/uL — ABNORMAL HIGH (ref 4.0–10.5)
nRBC: 0 % (ref 0.0–0.2)

## 2020-05-09 LAB — I-STAT BETA HCG BLOOD, ED (MC, WL, AP ONLY): I-stat hCG, quantitative: <5 m[IU]/mL (ref <5)

## 2020-05-09 MED ORDER — ACETAMINOPHEN 325 MG PO TABS
650.0000 mg | ORAL_TABLET | Freq: Once | ORAL | Status: AC
  Administered 2020-05-09: 650 mg via ORAL
  Filled 2020-05-09: qty 2

## 2020-05-09 NOTE — ED Triage Notes (Signed)
Pt presents to ED POv. Pt c/o SOB, fever, SOB. Pt reports that s/s began this morning. Pt reports that she has hx of COPD and pneumonia. Pt is not vaccinated for covid. resp e/u

## 2020-05-10 ENCOUNTER — Emergency Department (HOSPITAL_COMMUNITY): Payer: 59

## 2020-05-10 LAB — RESP PANEL BY RT-PCR (FLU A&B, COVID) ARPGX2
Influenza A by PCR: NEGATIVE
Influenza B by PCR: NEGATIVE
SARS Coronavirus 2 by RT PCR: POSITIVE — AB

## 2020-05-10 LAB — TROPONIN I (HIGH SENSITIVITY): Troponin I (High Sensitivity): 6 ng/L (ref <18)

## 2020-05-10 NOTE — ED Notes (Signed)
Pt left due to not being seen quick enough 

## 2020-05-11 ENCOUNTER — Telehealth: Payer: Self-pay

## 2020-05-11 NOTE — Telephone Encounter (Signed)
I connected by phone with Erin Zamora on 05/11/2020 at 9:08 AM to discuss the potential use of a new treatment for mild to moderate COVID-19 viral infection in non-hospitalized patients.  This patient is a 50 y.o. female that meets the FDA criteria for Emergency Use Authorization of COVID monoclonal antibody sotrovimab.  Has a (+) direct SARS-CoV-2 viral test result  Has mild or moderate COVID-19   Is NOT hospitalized due to COVID-19  Is within 10 days of symptom onset  Has at least one of the high risk factor(s) for progression to severe COVID-19 and/or hospitalization as defined in EUA.  Specific high risk criteria : Chronic Lung Disease   I have spoken and communicated the following to the patient or parent/caregiver regarding COVID monoclonal antibody treatment:  1. FDA has authorized the emergency use for the treatment of mild to moderate COVID-19 in adults and pediatric patients with positive results of direct SARS-CoV-2 viral testing who are 66 years of age and older weighing at least 40 kg, and who are at high risk for progressing to severe COVID-19 and/or hospitalization.  2. The significant known and potential risks and benefits of COVID monoclonal antibody, and the extent to which such potential risks and benefits are unknown.  3. Information on available alternative treatments and the risks and benefits of those alternatives, including clinical trials.  4. Patients treated with COVID monoclonal antibody should continue to self-isolate and use infection control measures (e.g., wear mask, isolate, social distance, avoid sharing personal items, clean and disinfect "high touch" surfaces, and frequent handwashing) according to CDC guidelines.   5. The patient or parent/caregiver has the option to accept or refuse COVID monoclonal antibody treatment.  After reviewing this information with the patient, the patient has DECLINED offer to receive the infusion. Marcello Moores,  RN 05/11/2020 9:08 AM

## 2020-05-21 ENCOUNTER — Other Ambulatory Visit: Payer: Self-pay

## 2020-05-21 ENCOUNTER — Ambulatory Visit (INDEPENDENT_AMBULATORY_CARE_PROVIDER_SITE_OTHER): Payer: 59 | Admitting: Psychiatry

## 2020-05-21 ENCOUNTER — Encounter (HOSPITAL_COMMUNITY): Payer: Self-pay | Admitting: Psychiatry

## 2020-05-21 DIAGNOSIS — F411 Generalized anxiety disorder: Secondary | ICD-10-CM

## 2020-05-21 NOTE — Progress Notes (Signed)
Virtual Visit via Video Note  I connected with Erin Zamora on 05/21/20 at  1:30 PM EST by a video enabled telemedicine application and verified that I am speaking with the correct person using two identifiers.  Location: Patient: Patient Home Provider: Home Office  I discussed the limitations of evaluation and management by telemedicine and the availability of in person appointments. The patient expressed understanding and agreed to proceed.  History of Present Illness: GAD  Treatment Plan Goals: 1)Erin Zamora would like to reduce the impact of work-related stress by improving boundary setting, communicating needs and applying coping strategies during work hours. 2)Erin Zamora would like to reduce paranoia, depressive/anxiety symptoms by addressing cognitive distortions through learning and applying CBT skills/interventions. 3)Erin Zamora feels disconnected from self and would like to explore her identity, interest and preferences to create a better work-life balance to alleviate stress and anxiety.  Observations/Objective: Counselor met with Client for individual therapy via Webex. Counselor assessed MH symptoms and progress on treatment plan goals, with patient reportingthat she was managing symptoms well and applying skills to be preventative in managing her anxiety. Client presents withmilddepression and moderateanxiety. Client denied suicidal ideation or self-harm behaviors.   Goal 1: Counselor assessed and processed progress toward goal, using MI skills. Client reports that she has been successful in maintaining boundaries with partner and daughter. Client shared about a situation in her extended family that has been a source of significant stress and how she applied healthier coping and communication skills, causing more positive outcomes, than how she would have handled it in the past. Client was pleased with ability to slow reaction time down before over committing or  involving herself in the situation. Counselor and Client discussed was to expand boundaries to protect time and emotions in the future regarding the situation that is unfolding.   Goal 2: When processing work challenges Client noted efforts to apply CBT skills of examining the evidence to confront her thoughts of paranoia. Client denied new occurances of paranoia. Client reports progress in managing mental health on the job, where she experienced paranoia the most in the past. Client communicating and establishing healthier boundaries and mental breaks on the job.   Goal 3: Counselor prompted Client to name ways she is applying self-care spontaneously and routinely. Client discussed planning a vacation and more time off intermittently to travel and to relax. Client hopes to firm up plans in the near future. Client reports being motivated to reward self for hard work. Counselor praised client for action steps and change in view of self worth.  Assessment and Plan: Counselor will continue to meet with patient to address treatment plan goals. Patient will continue to follow recommendations of providers and implement skills learned in session.  Follow Up Instructions: Counselor will send information for next session via Webex.   The patient was advised to call back or seek an in-person evaluation if the symptoms worsen or if the condition fails to improve as anticipated.  I provided55 minutes of non-face-to-face time during this encounter.   Lise Auer, LCSW

## 2020-06-11 ENCOUNTER — Telehealth (INDEPENDENT_AMBULATORY_CARE_PROVIDER_SITE_OTHER): Payer: 59 | Admitting: Psychiatry

## 2020-06-11 ENCOUNTER — Other Ambulatory Visit: Payer: Self-pay

## 2020-06-11 ENCOUNTER — Encounter (HOSPITAL_COMMUNITY): Payer: Self-pay | Admitting: Psychiatry

## 2020-06-11 DIAGNOSIS — F3342 Major depressive disorder, recurrent, in full remission: Secondary | ICD-10-CM | POA: Diagnosis not present

## 2020-06-11 DIAGNOSIS — F411 Generalized anxiety disorder: Secondary | ICD-10-CM

## 2020-06-11 MED ORDER — SERTRALINE HCL 100 MG PO TABS
100.0000 mg | ORAL_TABLET | Freq: Every day | ORAL | 0 refills | Status: DC
Start: 2020-06-11 — End: 2020-09-03

## 2020-06-11 NOTE — Progress Notes (Signed)
[place] MD/PA/NP OP Progress Note  Virtual Visit via Telephone Note  I connected with Erin Zamora on 06/11/20 at 11:40 AM EST by telephone and verified that I am speaking with the correct person using two identifiers.  Location: Patient: home Provider: Clinic   I discussed the limitations, risks, security and privacy concerns of performing an evaluation and management service by telephone and the availability of in person appointments. I also discussed with the patient that there may be a patient responsible charge related to this service. The patient expressed understanding and agreed to proceed.   I provided 15 minutes of non-face-to-face time during this encounter.     06/11/2020 11:53 AM Erin Zamora  MRN:  [id number]  Chief Complaint: " I am doing better."   HPI: Patient reported that she is doing well.  She informed that her mood has been stable.  She stated that she has found talking to therapist Ms. Bethany very beneficial.  She has been taking her medication sertraline regularly.  She stated that hydroxyzine makes her very drowsy and therefore she generally does not take it much.  She does not need any refills on that one. She informed that she contracted Covid last month and was out of work for a week and then went back to work.  She is feeling better now and overall things are progressing fairly well for her.  Visit Diagnosis:    ICD-10-CM   1. Recurrent major depressive disorder, in full remission ([place])  F33.42   2. Generalized anxiety disorder  F41.1     Past Psychiatric History: MDD, anxiety  Past Medical History:  Past Medical History:  Diagnosis Date  . Allergy    seasonal  . Anemia   . Anxiety   . CAP (community acquired pneumonia) 10/2018  . Cough 10/2018  . Hyperlipidemia 10/2018  . Insomnia 10/2018  . Microcytic anemia   . Skin lesions 10/2019  . Thrombocytosis   . Vitamin D deficiency 10/2018    Past Surgical History:  Procedure  Laterality Date  . APPENDECTOMY  1991  . CHOLECYSTECTOMY  2003  . TUBAL LIGATION  2003    Family Psychiatric History: see below  Family History:  Family History  Problem Relation Age of Onset  . Alcohol abuse Mother   . COPD Mother   . Hypertension Mother   . Alcohol abuse Father   . Diabetes Father   . Drug abuse Father   . Heart disease Father   . Hypertension Father   . Kidney disease Father   . Arthritis Maternal Grandmother   . Arthritis Maternal Grandfather   . Melanoma Maternal Grandfather   . Cancer Paternal Grandmother   . Diabetes Paternal Grandmother   . Heart disease Paternal Grandmother   . Diabetes Paternal Grandfather   . Heart disease Paternal Grandfather   . Kidney disease Paternal Grandfather   . Stroke Paternal Grandfather     Social History:  Social History   Socioeconomic History  . Marital status: Single    Spouse name: Not on file  . Number of children: Not on file  . Years of education: Not on file  . Highest education level: Not on file  Occupational History  . Not on file  Tobacco Use  . Smoking status: Former Smoker    Packs/day: 0.25    Types: Cigarettes    Quit date: 10/01/2018    Years since quitting: 1.6  . Smokeless tobacco: Never Used  . Tobacco comment:  5-7 ciggs per day  Vaping Use  . Vaping Use: Never used  Substance and Sexual Activity  . Alcohol use: Yes    Alcohol/week: 0.0 standard drinks    Comment: rare  . Drug use: No  . Sexual activity: Yes    Partners: Male    Birth control/protection: None  Other Topics Concern  . Not on file  Social History Narrative   49 year old and 32 y/o daughters   80 year old granddaughter - takes care of her in the afternoon after school   Significant other - 15 years   Work: Web designer   Social Determinants of Radio broadcast assistant Strain: Not on [employer] Insecurity: Not on file  Transportation Needs: Not on file  Physical Activity: Not on file  Stress:  Not on file  Social Connections: Not on file    Allergies:  Allergies  Allergen Reactions  . Codeine Itching and Nausea And Vomiting    Metabolic Disorder Labs: Lab Results  Component Value Date   HGBA1C 5.9 (A) 11/22/2019   HGBA1C 5.9 11/22/2019   HGBA1C 5.9 11/22/2019   HGBA1C 5.9 11/22/2019   No results found for: PROLACTIN Lab Results  Component Value Date   CHOL 168 10/23/2018   TRIG 228 (H) 10/23/2018   HDL 42 10/23/2018   CHOLHDL 4.0 10/23/2018   [place] 80 10/23/2018   Lab Results  Component Value Date   TSH 0.961 10/14/2018    Therapeutic Level Labs: No results found for: LITHIUM No results found for: VALPROATE No components found for:  CBMZ  Current Medications: Current Outpatient Medications  Medication Sig Dispense Refill  . acetaminophen (TYLENOL) 500 MG tablet Take 2 tablets (1,000 mg), by mouth, 2 times a day. 120 tablet 3  . gabapentin (NEURONTIN) 300 MG capsule Take 1 capsule (300 mg total) by mouth 3 (three) times daily. 90 capsule 3  . Glycopyrrolate-Formoterol (BEVESPI AEROSPHERE) 9-4.8 MCG/ACT AERO Inhale 2 puffs into the lungs 2 (two) times a day. 10.7 g 5  . hydrOXYzine (ATARAX/VISTARIL) 10 MG tablet Take 1 tablet (10 mg total) by mouth 2 (two) times daily as needed for anxiety. 60 tablet 1  . ibuprofen (ADVIL) 800 MG tablet TAKE 1 TABLET BY MOUTH EVERY 8 HOURS AS NEEDED FOR MODERATE PAIN 30 tablet 0  . sertraline (ZOLOFT) 100 MG tablet Take 1 tablet (100 mg total) by mouth daily. 90 tablet 0  . Vitamin D, Ergocalciferol, (DRISDOL) 1.25 MG (50000 UT) CAPS capsule Take 1 capsule (50,000 Units total) by mouth every 7 (seven) days. 5 capsule 6   No current facility-administered medications for this visit.     Psychiatric Specialty Exam: Review of Systems  There were no vitals taken for this visit.There is no height or weight on file to calculate BMI.  General Appearance: unable to assess due to phone visit  Eye Contact:  unable to assess due  to phone visit  Speech:  Clear and Coherent and Normal Rate  Volume:  Normal  Mood:  Anxious  Affect:  Congruent  Thought Process:  Goal Directed, Linear and Descriptions of Associations: Intact  Orientation:  Full (Time, Place, and Person)  Thought Content: Logical   Suicidal Thoughts:  No  Homicidal Thoughts:  No  Memory:  Recent;   Good Remote;   Good  Judgement:  Good  Insight:  Good  Psychomotor Activity:  Normal  Concentration:  Concentration: Good and Attention Span: Good  Recall:  [place]  of Knowledge: Good  Language: Good  Akathisia:  Negative  Handed:  Right  AIMS (if indicated): not done  Assets:  Communication Skills Desire for Improvement Financial Resources/Insurance Housing  ADL's:  Intact  Cognition: WNL  Sleep:  Fair     Screenings: [place] Office Visit from 01/24/2020 in [place] Office Visit from 01/01/2019 in [place] Office Visit from 11/20/2018 in [place] Office Visit from 10/23/2018 in [name] Office Visit from 12/22/2015 in [facility] at [facility] Total Score 0 0 0 0 0       Assessment and Plan: Patient reported improvement in her mood anxiety.  Denies any concerns at this time.  She has not had a chance to get her thyroid panel done.  1. Recurrent major depressive disorder, in remission (HCC)  - sertraline (ZOLOFT) 100 MG tablet; Take 1 tablet (100 mg total) by mouth daily.  Dispense: 90 tablet; Refill: 1   2. Generalized anxiety disorder  - sertraline (ZOLOFT) 100 MG tablet; Take 1 tablet (100 mg total) by mouth daily.  Dispense: 90 tablet; Refill:   Continue same regimen for now Follow-up in 3 months. Continue individual therapy.    [place] Crane, MD 06/11/2020, 11:53 AM

## 2020-06-27 NOTE — Progress Notes (Signed)
[facility]  Telephone:([phone] [phone]  HEMATOLOGY-ONCOLOGY TELEMEDICINE VISIT PROGRESS NOTE  ID: Erin Zamora OB: 09-19-1970  MR#: [id number]  TKW#:[id number]  Patient Care Team: Azzie Glatter, FNP as PCP - General (Family Medicine) Lloyd Huger, MD as Consulting Physician (Oncology)   CHIEF COMPLAINT: Thrombocytosis, iron deficiency anemia.  INTERVAL HISTORY: Patient returns to clinic today for repeat laboratory work, further evaluation and consideration of IV Venofer.  She currently feels well and is asymptomatic.  She does not complain of any weakness or fatigue.  She has no neurologic complaints.  She denies any recent fevers or illnesses.  She has a good appetite and denies weight loss.  She has no chest pain, shortness of breath, cough, or hemoptysis.  She denies any nausea, vomiting, constipation, or diarrhea.  She denies any melena or hematochezia.  She has no urinary complaints.  Patient offers no specific complaints today.    REVIEW OF SYSTEMS:   Review of Systems  Constitutional: Negative.  Negative for fever, malaise/fatigue and weight loss.  Respiratory: Negative.  Negative for cough, hemoptysis and shortness of breath.   Cardiovascular: Negative.  Negative for chest pain and leg swelling.  Gastrointestinal: Negative.  Negative for abdominal pain.  Genitourinary: Negative.  Negative for dysuria.  Musculoskeletal: Positive for back pain.  Skin: Negative.  Negative for rash.  Neurological: Negative.  Negative for dizziness, focal weakness, weakness and headaches.  Psychiatric/Behavioral: Negative.  The patient is not nervous/anxious.     As per HPI. Otherwise, a complete review of systems is negative.  PAST MEDICAL HISTORY: Past Medical History:  Diagnosis Date  . Allergy    seasonal  . Anemia   . Anxiety   . CAP (community acquired pneumonia) 10/2018  . Cough 10/2018  . Hyperlipidemia 10/2018  . Insomnia  10/2018  . Microcytic anemia   . Skin lesions 10/2019  . Thrombocytosis   . Vitamin D deficiency 10/2018    PAST SURGICAL HISTORY: Past Surgical History:  Procedure Laterality Date  . APPENDECTOMY  1991  . CHOLECYSTECTOMY  2003  . TUBAL LIGATION  2003    FAMILY HISTORY: Family History  Problem Relation Age of Onset  . Alcohol abuse Mother   . COPD Mother   . Hypertension Mother   . Alcohol abuse Father   . Diabetes Father   . Drug abuse Father   . Heart disease Father   . Hypertension Father   . Kidney disease Father   . Arthritis Maternal Grandmother   . Arthritis Maternal Grandfather   . Melanoma Maternal Grandfather   . Cancer Paternal Grandmother   . Diabetes Paternal Grandmother   . Heart disease Paternal Grandmother   . Diabetes Paternal Grandfather   . Heart disease Paternal Grandfather   . Kidney disease Paternal Grandfather   . Stroke Paternal Grandfather     ADVANCED DIRECTIVES (Y/N):  N  HEALTH MAINTENANCE: Social History   Tobacco Use  . Smoking status: Former Smoker    Packs/day: 0.25    Types: Cigarettes    Quit date: 10/01/2018    Years since quitting: 1.7  . Smokeless tobacco: Never Used  . Tobacco comment: 5-7 ciggs per day  Vaping Use  . Vaping Use: Never used  Substance Use Topics  . Alcohol use: Yes    Alcohol/week: 0.0 standard drinks    Comment: rare  . Drug use: No     Colonoscopy:  PAP:  Bone density:  Lipid panel:  Allergies  Allergen Reactions  . Codeine Itching and Nausea And Vomiting    Current Outpatient Medications  Medication Sig Dispense Refill  . acetaminophen (TYLENOL) 500 MG tablet Take 2 tablets (1,000 mg), by mouth, 2 times a day. 120 tablet 3  . gabapentin (NEURONTIN) 300 MG capsule Take 1 capsule (300 mg total) by mouth 3 (three) times daily. 90 capsule 3  . Glycopyrrolate-Formoterol (BEVESPI AEROSPHERE) 9-4.8 MCG/ACT AERO Inhale 2 puffs into the lungs 2 (two) times a day. 10.7 g 5  . hydrOXYzine  (ATARAX/VISTARIL) 10 MG tablet Take 1 tablet (10 mg total) by mouth 2 (two) times daily as needed for anxiety. 60 tablet 1  . ibuprofen (ADVIL) 800 MG tablet TAKE 1 TABLET BY MOUTH EVERY 8 HOURS AS NEEDED FOR MODERATE PAIN 30 tablet 0  . sertraline (ZOLOFT) 100 MG tablet Take 1 tablet (100 mg total) by mouth daily. 90 tablet 0  . Vitamin D, Ergocalciferol, (DRISDOL) 1.25 MG (50000 UT) CAPS capsule Take 1 capsule (50,000 Units total) by mouth every 7 (seven) days. 5 capsule 6   No current facility-administered medications for this visit.    OBJECTIVE: Vitals:   07/02/20 1401  BP: (!) 162/95  Pulse: 81  Resp: 16  Temp: 98.3 F (36.8 C)  SpO2: 96%     Body mass index is 42.27 kg/m.    ECOG FS:0 - Asymptomatic  General: Well-developed, well-nourished, no acute distress. Eyes: Pink conjunctiva, anicteric sclera. HEENT: Normocephalic, moist mucous membranes. Lungs: No audible wheezing or coughing. Heart: Regular rate and rhythm. Abdomen: Soft, nontender, no obvious distention. Musculoskeletal: No edema, cyanosis, or clubbing. Neuro: Alert, answering all questions appropriately. Cranial nerves grossly intact. Skin: No rashes or petechiae noted. Psych: Normal affect.  LAB RESULTS:  Lab Results  Component Value Date   NA 132 (L) 05/09/2020   K 3.5 05/09/2020   CL 94 (L) 05/09/2020   CO2 24 05/09/2020   GLUCOSE 127 (H) 05/09/2020   BUN 8 05/09/2020   CREATININE 0.76 05/09/2020   CALCIUM 9.0 05/09/2020   PROT 6.8 10/23/2018   ALBUMIN 4.6 10/23/2018   AST 13 10/23/2018   ALT 17 10/23/2018   ALKPHOS 66 10/23/2018   BILITOT 0.5 10/23/2018   GFRNONAA >60 05/09/2020   GFRAA >60 10/15/2018    Lab Results  Component Value Date   WBC 8.6 07/02/2020   NEUTROABS 5.2 07/02/2020   HGB 12.6 07/02/2020   HCT 39.3 07/02/2020   MCV 85.6 07/02/2020   PLT 459 (H) 07/02/2020   Lab Results  Component Value Date   IRON 33 07/02/2020   TIBC 528 (H) 07/02/2020   IRONPCTSAT 6 (L)  07/02/2020   Lab Results  Component Value Date   FERRITIN 8 (L) 07/02/2020     STUDIES: No results found.  ASSESSMENT: Thrombocytosis, iron deficiency anemia.  PLAN:  1.  Iron deficiency anemia: Patient's hemoglobin is now within normal limits, but her iron stores have trended down.  She is currently asymptomatic and does not require additional IV Venofer, but will likely need treatment at her next clinic visit.  She last received treatment on December 12, 2019.  Patient still will require a screening colonoscopy in the near future.  Return to clinic in 6 months with repeat laboratory work, further evaluation, and consideration of treatment.   2. Thrombocytosis: Chronic and unchanged.  Likely secondary to iron deficiency.  Previously, JAK-2 mutation was negative. 3. Back pain: Chronic and unchanged.   Patient expressed understanding that she can return to clinic  at any time if she has any questions, concerns, or complaints.   Lloyd Huger, MD 07/04/2020 7:37 AM

## 2020-07-02 ENCOUNTER — Encounter: Payer: Self-pay | Admitting: Oncology

## 2020-07-02 ENCOUNTER — Inpatient Hospital Stay (HOSPITAL_BASED_OUTPATIENT_CLINIC_OR_DEPARTMENT_OTHER): Payer: 59 | Admitting: Oncology

## 2020-07-02 ENCOUNTER — Inpatient Hospital Stay: Payer: 59 | Attending: Oncology

## 2020-07-02 ENCOUNTER — Inpatient Hospital Stay: Payer: 59

## 2020-07-02 VITALS — BP 162/95 | HR 81 | Temp 98.3°F | Resp 16 | Wt 254.0 lb

## 2020-07-02 DIAGNOSIS — D509 Iron deficiency anemia, unspecified: Secondary | ICD-10-CM | POA: Insufficient documentation

## 2020-07-02 DIAGNOSIS — D75839 Thrombocytosis, unspecified: Secondary | ICD-10-CM | POA: Diagnosis not present

## 2020-07-02 DIAGNOSIS — G8929 Other chronic pain: Secondary | ICD-10-CM | POA: Insufficient documentation

## 2020-07-02 DIAGNOSIS — E785 Hyperlipidemia, unspecified: Secondary | ICD-10-CM | POA: Insufficient documentation

## 2020-07-02 DIAGNOSIS — Z79899 Other long term (current) drug therapy: Secondary | ICD-10-CM | POA: Insufficient documentation

## 2020-07-02 DIAGNOSIS — E559 Vitamin D deficiency, unspecified: Secondary | ICD-10-CM | POA: Diagnosis not present

## 2020-07-02 DIAGNOSIS — Z87891 Personal history of nicotine dependence: Secondary | ICD-10-CM | POA: Insufficient documentation

## 2020-07-02 DIAGNOSIS — F419 Anxiety disorder, unspecified: Secondary | ICD-10-CM | POA: Insufficient documentation

## 2020-07-02 DIAGNOSIS — M549 Dorsalgia, unspecified: Secondary | ICD-10-CM | POA: Diagnosis not present

## 2020-07-02 LAB — CBC WITH DIFFERENTIAL/PLATELET
Abs Immature Granulocytes: 0.07 10*3/uL (ref 0.00–0.07)
Basophils Absolute: 0.1 10*3/uL (ref 0.0–0.1)
Basophils Relative: 1 %
Eosinophils Absolute: 0.4 10*3/uL (ref 0.0–0.5)
Eosinophils Relative: 4 %
HCT: 39.3 % (ref 36.0–46.0)
Hemoglobin: 12.6 g/dL (ref 12.0–15.0)
Immature Granulocytes: 1 %
Lymphocytes Relative: 23 %
Lymphs Abs: 2 10*3/uL (ref 0.7–4.0)
MCH: 27.5 pg (ref 26.0–34.0)
MCHC: 32.1 g/dL (ref 30.0–36.0)
MCV: 85.6 fL (ref 80.0–100.0)
Monocytes Absolute: 0.8 10*3/uL (ref 0.1–1.0)
Monocytes Relative: 10 %
Neutro Abs: 5.2 10*3/uL (ref 1.7–7.7)
Neutrophils Relative %: 61 %
Platelets: 459 10*3/uL — ABNORMAL HIGH (ref 150–400)
RBC: 4.59 MIL/uL (ref 3.87–5.11)
RDW: 13.9 % (ref 11.5–15.5)
WBC: 8.6 10*3/uL (ref 4.0–10.5)
nRBC: 0 % (ref 0.0–0.2)

## 2020-07-02 LAB — IRON AND TIBC
Iron: 33 ug/dL (ref 28–170)
Saturation Ratios: 6 % — ABNORMAL LOW (ref 10.4–31.8)
TIBC: 528 ug/dL — ABNORMAL HIGH (ref 250–450)
UIBC: 495 ug/dL

## 2020-07-02 LAB — FERRITIN: Ferritin: 8 ng/mL — ABNORMAL LOW (ref 11–307)

## 2020-07-02 NOTE — Progress Notes (Signed)
Patient here for oncology follow-up appointment, expresses no new complaints or concerns at this time.   

## 2020-07-16 ENCOUNTER — Other Ambulatory Visit: Payer: Self-pay

## 2020-07-16 ENCOUNTER — Emergency Department (HOSPITAL_BASED_OUTPATIENT_CLINIC_OR_DEPARTMENT_OTHER): Payer: 59 | Admitting: Radiology

## 2020-07-16 ENCOUNTER — Encounter (HOSPITAL_BASED_OUTPATIENT_CLINIC_OR_DEPARTMENT_OTHER): Payer: Self-pay | Admitting: *Deleted

## 2020-07-16 ENCOUNTER — Emergency Department (HOSPITAL_BASED_OUTPATIENT_CLINIC_OR_DEPARTMENT_OTHER)
Admission: EM | Admit: 2020-07-16 | Discharge: 2020-07-16 | Disposition: A | Discharge status: Home / Self Care | Payer: 59 | Attending: Emergency Medicine | Admitting: Emergency Medicine

## 2020-07-16 ENCOUNTER — Emergency Department (HOSPITAL_BASED_OUTPATIENT_CLINIC_OR_DEPARTMENT_OTHER): Payer: 59

## 2020-07-16 ENCOUNTER — Ambulatory Visit (HOSPITAL_COMMUNITY)
Admission: EM | Admit: 2020-07-16 | Discharge: 2020-07-16 | Disposition: A | Discharge status: Home / Self Care | Payer: 59

## 2020-07-16 DIAGNOSIS — R55 Syncope and collapse: Secondary | ICD-10-CM | POA: Diagnosis not present

## 2020-07-16 DIAGNOSIS — M899 Disorder of bone, unspecified: Secondary | ICD-10-CM | POA: Diagnosis not present

## 2020-07-16 DIAGNOSIS — M25511 Pain in right shoulder: Secondary | ICD-10-CM | POA: Insufficient documentation

## 2020-07-16 DIAGNOSIS — E876 Hypokalemia: Secondary | ICD-10-CM | POA: Diagnosis not present

## 2020-07-16 DIAGNOSIS — J0101 Acute recurrent maxillary sinusitis: Secondary | ICD-10-CM | POA: Diagnosis not present

## 2020-07-16 DIAGNOSIS — R519 Headache, unspecified: Secondary | ICD-10-CM | POA: Insufficient documentation

## 2020-07-16 DIAGNOSIS — Z20822 Contact with and (suspected) exposure to covid-19: Secondary | ICD-10-CM | POA: Insufficient documentation

## 2020-07-16 DIAGNOSIS — Z87891 Personal history of nicotine dependence: Secondary | ICD-10-CM | POA: Insufficient documentation

## 2020-07-16 DIAGNOSIS — J449 Chronic obstructive pulmonary disease, unspecified: Secondary | ICD-10-CM | POA: Insufficient documentation

## 2020-07-16 DIAGNOSIS — W19XXXA Unspecified fall, initial encounter: Secondary | ICD-10-CM | POA: Insufficient documentation

## 2020-07-16 DIAGNOSIS — S7001XA Contusion of right hip, initial encounter: Secondary | ICD-10-CM | POA: Insufficient documentation

## 2020-07-16 DIAGNOSIS — Y990 Civilian activity done for income or pay: Secondary | ICD-10-CM | POA: Diagnosis not present

## 2020-07-16 DIAGNOSIS — J0100 Acute maxillary sinusitis, unspecified: Secondary | ICD-10-CM

## 2020-07-16 DIAGNOSIS — S40021A Contusion of right upper arm, initial encounter: Secondary | ICD-10-CM | POA: Diagnosis not present

## 2020-07-16 DIAGNOSIS — S4991XA Unspecified injury of right shoulder and upper arm, initial encounter: Secondary | ICD-10-CM | POA: Diagnosis present

## 2020-07-16 LAB — COMPREHENSIVE METABOLIC PANEL
ALT: 18 U/L (ref 0–44)
AST: 16 U/L (ref 15–41)
Albumin: 4.2 g/dL (ref 3.5–5.0)
Alkaline Phosphatase: 70 U/L (ref 38–126)
Anion gap: 10 (ref 5–15)
BUN: 12 mg/dL (ref 6–20)
CO2: 28 mmol/L (ref 22–32)
Calcium: 8.9 mg/dL (ref 8.9–10.3)
Chloride: 100 mmol/L (ref 98–111)
Creatinine, Ser: 0.6 mg/dL (ref 0.44–1.00)
GFR, Estimated: >60 mL/min (ref >60)
Glucose, Bld: 110 mg/dL — ABNORMAL HIGH (ref 70–99)
Potassium: 3.3 mmol/L — ABNORMAL LOW (ref 3.5–5.1)
Sodium: 138 mmol/L (ref 135–145)
Total Bilirubin: 0.3 mg/dL (ref 0.3–1.2)
Total Protein: 6.9 g/dL (ref 6.5–8.1)

## 2020-07-16 LAB — CBC WITH DIFFERENTIAL/PLATELET
Abs Immature Granulocytes: 0.07 10*3/uL (ref 0.00–0.07)
Basophils Absolute: 0.1 10*3/uL (ref 0.0–0.1)
Basophils Relative: 1 %
Eosinophils Absolute: 0.3 10*3/uL (ref 0.0–0.5)
Eosinophils Relative: 3 %
HCT: 39 % (ref 36.0–46.0)
Hemoglobin: 12.4 g/dL (ref 12.0–15.0)
Immature Granulocytes: 1 %
Lymphocytes Relative: 24 %
Lymphs Abs: 2.2 10*3/uL (ref 0.7–4.0)
MCH: 26.7 pg (ref 26.0–34.0)
MCHC: 31.8 g/dL (ref 30.0–36.0)
MCV: 83.9 fL (ref 80.0–100.0)
Monocytes Absolute: 0.6 10*3/uL (ref 0.1–1.0)
Monocytes Relative: 6 %
Neutro Abs: 6 10*3/uL (ref 1.7–7.7)
Neutrophils Relative %: 65 %
Platelets: 469 10*3/uL — ABNORMAL HIGH (ref 150–400)
RBC: 4.65 MIL/uL (ref 3.87–5.11)
RDW: 13.7 % (ref 11.5–15.5)
WBC: 9.2 10*3/uL (ref 4.0–10.5)
nRBC: 0 % (ref 0.0–0.2)

## 2020-07-16 LAB — TROPONIN I (HIGH SENSITIVITY): Troponin I (High Sensitivity): 9 ng/L (ref <18)

## 2020-07-16 LAB — RESP PANEL BY RT-PCR (FLU A&B, COVID) ARPGX2
Influenza A by PCR: NEGATIVE
Influenza B by PCR: NEGATIVE
SARS Coronavirus 2 by RT PCR: NEGATIVE

## 2020-07-16 MED ORDER — DICLOFENAC SODIUM 1 % EX GEL: 2.0000 g | Freq: Four times a day (QID) | CUTANEOUS | 0 refills | Status: AC

## 2020-07-16 MED ORDER — AZITHROMYCIN 250 MG PO TABS: 250.0000 mg | ORAL_TABLET | Freq: Every day | ORAL | 0 refills | Status: DC

## 2020-07-16 MED ORDER — MORPHINE SULFATE (PF) 4 MG/ML IV SOLN
4.0000 mg | Freq: Once | INTRAVENOUS | Status: AC
  Administered 2020-07-16: 4 mg via INTRAVENOUS
  Filled 2020-07-16: qty 1

## 2020-07-16 MED ORDER — FLUTICASONE PROPIONATE 50 MCG/ACT NA SUSP: 2.0000 | Freq: Every day | NASAL | 0 refills | Status: DC

## 2020-07-16 MED ORDER — ONDANSETRON HCL 4 MG/2ML IJ SOLN
4.0000 mg | Freq: Once | INTRAMUSCULAR | Status: AC
  Administered 2020-07-16: 4 mg via INTRAVENOUS
  Filled 2020-07-16: qty 2

## 2020-07-16 NOTE — Discharge Instructions (Addendum)
You were seen in the emerge department today after passing out.  Your x-rays and CT scans did not show any breaks or bleeding.  Your lab work showed some mildly low potassium.  Your primary care doctor will need to see you in the next week to repeat some blood work and potentially send you for other tests as needed after your passing out episode.   When we performed a CT scan of your head we found an area on the scan that was abnormal in the bones of your face.  Please have the primary care doctor review these images.  The radiologist is recommending that you get an MRI with and without contrast for further evaluation of this area.  Return to the emergency department if you develop any chest pain, trouble breathing, additional passing out episodes.

## 2020-07-16 NOTE — ED Notes (Signed)
Pt went to the ED with her daughter.  Pt reported she fell and loss of consciousness.

## 2020-07-16 NOTE — ED Triage Notes (Signed)
Pt reports that she choked on a piece of candy today, states that she stood to walk out of the room and had a syncopal episode. Pt reports right shoulder, elbow and hip pain.  Pt ambulatory.

## 2020-07-16 NOTE — ED Provider Notes (Signed)
Emergency Department Provider Note   I have reviewed the triage vital signs and the nursing notes.   HISTORY  Chief Complaint Fall   HPI Erin Zamora is a 50 y.o. female with past medical history reviewed below including chronic pain presents to the emergency department after experiencing a syncope event at work today.  Patient states that she not infrequently gets some cough or choking type sensation.  She did not choke on an object.  She tells me that she has had cough and some congestion over the past several days after watching her granddaughter.  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  She does not recall having chest pain, shortness of breath worse than normal, heart palpitation.  She is not been having fevers.  After the fall she is experiencing some pain to the right side of the head, right elbow, right shoulder, right hip.  She has been able to walk on the hip but has discomfort with doing so.  She did note a small area of bleeding to the right elbow.   Past Medical History:  Diagnosis Date  . Allergy    seasonal  . Anemia   . Anxiety   . CAP (community acquired pneumonia) 10/2018  . Cough 10/2018  . Hyperlipidemia 10/2018  . Insomnia 10/2018  . Microcytic anemia   . Skin lesions 10/2019  . Thrombocytosis   . Vitamin D deficiency 10/2018    Patient Active Problem List   Diagnosis Date Noted  . Recurrent major depressive disorder, in full remission ([place]) 06/11/2020  . Protrusion of lumbar intervertebral disc 03/25/2020  . Spinal stenosis of lumbar region 03/25/2020  . Skin lesions, generalized 12/02/2019  . Recurrent major depressive disorder, in partial remission ([employer]) 09/23/2019  . MDD (major depressive disorder), recurrent episode, moderate ([place]) 07/17/2019  . Generalized anxiety disorder 07/17/2019  . Paranoia ([place]) 05/26/2019  . Chronic left-sided low back pain with left-sided sciatica  01/01/2019  . Numbness and tingling of left lower extremity 01/01/2019  . Iron deficiency anemia 11/09/2018  . Thrombocytosis 11/03/2018  . COPD with chronic bronchitis and emphysema ([place]) 10/29/2018  . Cough 10/23/2018  . Chest congestion 10/23/2018  . Anxiety 10/23/2018  . Anemia 10/23/2018  . RLL pneumonia 10/13/2018  . Elevated blood pressure reading 10/13/2018  . Obesity (BMI 30.0-34.9) 10/13/2018  . Vitamin D deficiency 10/2018    Past Surgical History:  Procedure Laterality Date  . APPENDECTOMY  1991  . CHOLECYSTECTOMY  2003  . TUBAL LIGATION  2003    Allergies Codeine  Family History  Problem Relation Age of Onset  . Alcohol abuse Mother   . COPD Mother   . Hypertension Mother   . Alcohol abuse Father   . Diabetes Father   . Drug abuse Father   . Heart disease Father   . Hypertension Father   . Kidney disease Father   . Arthritis Maternal Grandmother   . Arthritis Maternal Grandfather   . Melanoma Maternal Grandfather   . Cancer Paternal Grandmother   . Diabetes Paternal Grandmother   . Heart disease Paternal Grandmother   . Diabetes Paternal Grandfather   . Heart disease Paternal Grandfather   . Kidney disease Paternal Grandfather   . Stroke Paternal Grandfather     Social History Social History   Tobacco Use  . Smoking status: Former Smoker    Packs/day: 0.25    Types: Cigarettes  Quit date: 10/01/2018    Years since quitting: 1.7  . Smokeless tobacco: Never Used  . Tobacco comment: 5-7 ciggs per day  Vaping Use  . Vaping Use: Never used  Substance Use Topics  . Alcohol use: Yes    Alcohol/week: 0.0 standard drinks    Comment: rare  . Drug use: No    Review of Systems  Constitutional: No fever/chills Eyes: No visual changes. ENT: No sore throat. Positive congestion.  Cardiovascular: Denies chest pain. Positive syncope.  Respiratory: Denies shortness of breath. Positive cough.  Gastrointestinal: No abdominal pain.  No nausea, no  vomiting.  No diarrhea.  No constipation. Genitourinary: Negative for dysuria. Musculoskeletal: Negative for back pain. Positive right elbow pain, shoulder pain, and right hip pain. Skin: Negative for rash. Neurological: Negative for focal weakness or numbness. Positive HA.   10-point ROS otherwise negative.  ____________________________________________   PHYSICAL EXAM:  VITAL SIGNS: ED Triage Vitals  Enc Vitals Group     BP 07/16/20 1754 (!) 157/95     Pulse Rate 07/16/20 1754 74     Resp 07/16/20 1754 18     Temp 07/16/20 1754 98.4 F (36.9 C)     Temp Source 07/16/20 1754 Oral     SpO2 07/16/20 1754 96 %     Weight 07/16/20 1755 245 lb (111.1 kg)     Height 07/16/20 1755 5\' 5"  (1.651 m)   Constitutional: Alert and oriented. Well appearing and in no acute distress. Eyes: Conjunctivae are normal. PERRL. EOMI. Head: Atraumatic but some tenderness to the right parietal scalp.  Nose: No congestion/rhinnorhea. Mouth/Throat: Mucous membranes are moist.  Oropharynx non-erythematous. Neck: No stridor. Diffuse midline and paracervical tenderness.  Cardiovascular: Normal rate, regular rhythm. Good peripheral circulation. Grossly normal heart sounds.   Respiratory: Normal respiratory effort.  No retractions. Lungs CTAB. Gastrointestinal: Soft and nontender. No distention.  Musculoskeletal: Mild swelling over the right elbow with a small abrasion but no laceration.  Limited range of motion due to pain.  Pain with active range of motion of the right shoulder but range of motion is preserved.  No scaphoid or other wrist tenderness, bruising bilaterally.  Pain with active and passive range of motion of the right hip but range of motion is preserved.  No tenderness over the knees or ankles bilaterally.  Neurologic:  Normal speech and language. No gross focal neurologic deficits are appreciated.  Skin:  Skin is warm and dry. Abrasion to the right elbow as described.   ____________________________________________   LABS (all labs ordered are listed, but only abnormal results are displayed)  Labs Reviewed  COMPREHENSIVE METABOLIC PANEL - Abnormal; Notable for the following components:      Result Value   Potassium 3.3 (*)    Glucose, Bld 110 (*)    All other components within normal limits  CBC WITH DIFFERENTIAL/PLATELET - Abnormal; Notable for the following components:   Platelets 469 (*)    All other components within normal limits  RESP PANEL BY RT-PCR (FLU A&B, COVID) ARPGX2  TROPONIN I (HIGH SENSITIVITY)   ____________________________________________  EKG  Rate: 82 PR: 143 QTc: 451  Sinus rhythm. Narrow QRS. No ST elevation or depression. No STEMI     ____________________________________________  RADIOLOGY  DG Chest 2 View  Result Date: 07/16/2020 CLINICAL DATA:  Fall on right side, loss of consciousness. EXAM: CHEST - 2 VIEW COMPARISON:  Radiograph 05/10/2020 FINDINGS: The cardiomediastinal contours are normal. The lungs are clear. Pulmonary vasculature is normal. No consolidation,  pleural effusion, or pneumothorax. No acute osseous abnormalities are seen. IMPRESSION: No acute chest findings. Electronically Signed   By: Keith Rake M.D.   On: 07/16/2020 19:14   DG Shoulder Right  Result Date: 07/16/2020 CLINICAL DATA:  Fall on right side.  No loss of consciousness. EXAM: RIGHT SHOULDER - 2+ VIEW COMPARISON:  None. FINDINGS: There is no evidence of fracture or dislocation. Trace degenerative acromioclavicular and glenohumeral spurring. Soft tissues are unremarkable. IMPRESSION: No acute fracture or subluxation of the right shoulder. Electronically Signed   By: Keith Rake M.D.   On: 07/16/2020 19:15   DG Elbow Complete Right  Result Date: 07/16/2020 CLINICAL DATA:  Fall on right side.  Loss of consciousness. EXAM: RIGHT ELBOW - COMPLETE 3+ VIEW COMPARISON:  None. FINDINGS: There is no evidence of fracture, dislocation, or  joint effusion. Chronic osseous excrescence from the lateral humeral epicondyle may represent sequela of remote injury or enthesopathic change. Normal joint spaces and alignment. Soft tissues are unremarkable. IMPRESSION: No acute fracture or subluxation of the right elbow. Electronically Signed   By: Keith Rake M.D.   On: 07/16/2020 19:16   CT Head Wo Contrast  Result Date: 07/16/2020 CLINICAL DATA:  Syncope. EXAM: CT HEAD WITHOUT CONTRAST TECHNIQUE: Contiguous axial images were obtained from the base of the skull through the vertex without intravenous contrast. COMPARISON:  July 19, 2007. FINDINGS: Brain: No evidence of acute infarction, hemorrhage, hydrocephalus, extra-axial collection or mass lesion/mass effect. Vascular: No hyperdense vessel or unexpected calcification. Skull: Normal. Negative for fracture or focal lesion. Sinuses/Orbits: Bilateral maxillary sinusitis is noted. Other: There is noted expansile partially lucent and sclerotic lesion involving the junction of the left zygomatic arch and maxillary bone. IMPRESSION: 1. Bilateral maxillary sinusitis. No acute intracranial abnormality seen. 2. Expansile partially lucent and sclerotic lesion is seen involving the junction of the left zygomatic arch and maxillary bone. This is concerning for possible neoplasm. Further evaluation with MRI with and without gadolinium administration is recommended. Electronically Signed   By: Marijo Conception M.D.   On: 07/16/2020 18:50   CT Cervical Spine Wo Contrast  Result Date: 07/16/2020 CLINICAL DATA:  Syncope. EXAM: CT CERVICAL SPINE WITHOUT CONTRAST TECHNIQUE: Multidetector CT imaging of the cervical spine was performed without intravenous contrast. Multiplanar CT image reconstructions were also generated. COMPARISON:  None. FINDINGS: Alignment: Normal. Skull base and vertebrae: No acute fracture. No primary bone lesion or focal pathologic process. Soft tissues and spinal canal: No prevertebral fluid  or swelling. No visible canal hematoma. Disc levels: Very mild anterior osteophyte formation is seen at the level of C5-C6. Normal multilevel intervertebral disc spaces are seen. Mild bilateral multilevel facet joint hypertrophy is seen. Upper chest: Negative. Other: None. IMPRESSION: 1. No acute fracture or subluxation of the cervical spine. 2. Very mild degenerative changes at the level of C5-C6. Electronically Signed   By: Virgina Norfolk M.D.   On: 07/16/2020 18:55   DG Hip Unilat W or Wo Pelvis 2-3 Views Right  Result Date: 07/16/2020 CLINICAL DATA:  Fall on right side.  Loss of consciousness. EXAM: DG HIP (WITH OR WITHOUT PELVIS) 2-3V RIGHT COMPARISON:  None. FINDINGS: No acute fracture. Femoral head is well seated in the acetabulum. There is moderate lateral acetabular spurring. The pubic rami are intact. Pubic symphysis and sacroiliac joints are congruent. Unremarkable soft tissues. IMPRESSION: No acute fracture of the pelvis or right hip. Electronically Signed   By: Keith Rake M.D.   On: 07/16/2020 19:17  ____________________________________________   PROCEDURES  Procedure(s) performed:   Procedures  None  ____________________________________________   INITIAL IMPRESSION / ASSESSMENT AND PLAN / ED COURSE  Pertinent labs & imaging results that were available during my care of the patient were reviewed by me and considered in my medical decision making (see chart for details).   Patient presents to the emergency department for evaluation of syncope in the setting of URI type symptoms, right elbow and hip pain.  She has some tenderness to palpation over the scalp but no outward sign of trauma.  She is neuro intact.  Plan for Covid screening along with labs and syncope work-up including troponin and EKG.  Will obtain chest x-ray along with plain films of the tender areas as described above including shoulder on the right, elbow, hip.  No prior history of syncope.   07:35 PM   Patient's imaging reviewed.  The patient's head CT shows some sinusitis which may explain some of her congestion symptoms.  Plan for azithromycin and Flonase.  No acute traumatic findings on CT.  There is a sclerotic lesion as noted above by the radiologist with recommendation for MRI with and without gadolinium which is not available this facility.  I do not see an emergent indication for the study.  Patient has a primary care physician and will discuss this finding with them.  I have included in the discharge paperwork along with radiology recommendation.  Labs show very mild hypokalemia.  Plan for dietary supplementation and recheck in the next week to see if additional supplementation is necessary.  No acute changes on EKG, troponin, other blood work to suggest a cardiogenic etiology of syncope.   No fractures on the plain films which were also reviewed.  Patient does have some contusions.  Discussed supportive care and over-the-counter pain medications as needed.  Also provided prescription for Voltaren gel. Discussed ED return precautions. Patient is feeling well and ambulatory here in the ED.  ____________________________________________  FINAL CLINICAL IMPRESSION(S) / ED DIAGNOSES  Final diagnoses:  Syncope, unspecified syncope type  Arm contusion, right, initial encounter  Contusion of right hip, initial encounter  Acute non-recurrent maxillary sinusitis  Bone lesion  Hypokalemia     MEDICATIONS GIVEN DURING THIS VISIT:  Medications  morphine 4 MG/ML injection 4 mg (4 mg Intravenous Given 07/16/20 1824)  ondansetron (ZOFRAN) injection 4 mg (4 mg Intravenous Given 07/16/20 1824)     NEW OUTPATIENT MEDICATIONS STARTED DURING THIS VISIT:  New Prescriptions   AZITHROMYCIN (ZITHROMAX) 250 MG TABLET    Take 1 tablet (250 mg total) by mouth daily. Take first 2 tablets together, then 1 every day until finished.   DICLOFENAC SODIUM (VOLTAREN) 1 % GEL    Apply 2 g topically 4 (four) times  daily for 14 days.   FLUTICASONE (FLONASE) 50 MCG/ACT NASAL SPRAY    Place 2 sprays into both nostrils daily for 7 days.    Note:  This document was prepared using Dragon voice recognition software and may include unintentional dictation errors.  Nanda Quinton, MD, [facility]    Rosaelena Kemnitz, [facility], MD 07/16/20 1945

## 2020-07-24 ENCOUNTER — Other Ambulatory Visit: Payer: Self-pay

## 2020-07-24 ENCOUNTER — Telehealth (INDEPENDENT_AMBULATORY_CARE_PROVIDER_SITE_OTHER): Payer: 59 | Admitting: Family Medicine

## 2020-07-24 ENCOUNTER — Encounter: Payer: Self-pay | Admitting: Family Medicine

## 2020-07-24 VITALS — Ht 65.0 in | Wt 240.0 lb

## 2020-07-24 DIAGNOSIS — E876 Hypokalemia: Secondary | ICD-10-CM

## 2020-07-24 DIAGNOSIS — R202 Paresthesia of skin: Secondary | ICD-10-CM

## 2020-07-24 DIAGNOSIS — R9389 Abnormal findings on diagnostic imaging of other specified body structures: Secondary | ICD-10-CM

## 2020-07-24 DIAGNOSIS — Z09 Encounter for follow-up examination after completed treatment for conditions other than malignant neoplasm: Secondary | ICD-10-CM

## 2020-07-24 DIAGNOSIS — R200 Anesthesia of skin: Secondary | ICD-10-CM

## 2020-07-24 DIAGNOSIS — F419 Anxiety disorder, unspecified: Secondary | ICD-10-CM

## 2020-07-24 DIAGNOSIS — R42 Dizziness and giddiness: Secondary | ICD-10-CM

## 2020-07-24 DIAGNOSIS — R55 Syncope and collapse: Secondary | ICD-10-CM | POA: Diagnosis not present

## 2020-07-24 MED ORDER — POTASSIUM CHLORIDE CRYS ER 20 MEQ PO TBCR
20.0000 meq | EXTENDED_RELEASE_TABLET | Freq: Every day | ORAL | 1 refills | Status: DC
Start: 2020-07-24 — End: 2021-01-05

## 2020-07-24 NOTE — Progress Notes (Signed)
Virtual Visit via Telephone Note  I connected with Erin Zamora on 07/28/20 at  3:20 PM EDT by telephone and verified that I am speaking with the correct person using two identifiers.  Location: Patient: Home Provider: Office   I discussed the limitations, risks, security and privacy concerns of performing an evaluation and management service by telephone and the availability of in person appointments. I also discussed with the patient that there may be a patient responsible charge related to this service. The patient expressed understanding and agreed to proceed.   History of Present Illness:  Patient Active Problem List   Diagnosis Date Noted  . Recurrent major depressive disorder, in full remission ([place]) 06/11/2020  . Protrusion of lumbar intervertebral disc 03/25/2020  . Spinal stenosis of lumbar region 03/25/2020  . Skin lesions, generalized 12/02/2019  . Recurrent major depressive disorder, in partial remission ([place]) 09/23/2019  . MDD (major depressive disorder), recurrent episode, moderate ([place]) 07/17/2019  . Generalized anxiety disorder 07/17/2019  . Paranoia ([place]) 05/26/2019  . Chronic left-sided low back pain with left-sided sciatica 01/01/2019  . Numbness and tingling of left lower extremity 01/01/2019  . Iron deficiency anemia 11/09/2018  . Thrombocytosis 11/03/2018  . COPD with chronic bronchitis and emphysema ([place]) 10/29/2018  . Cough 10/23/2018  . Chest congestion 10/23/2018  . Anxiety 10/23/2018  . Anemia 10/23/2018  . RLL pneumonia 10/13/2018  . Elevated blood pressure reading 10/13/2018  . Obesity (BMI 30.0-34.9) 10/13/2018  . Vitamin D deficiency 10/2018    Current Status: Since her last office visit, she is doing well with no complaints. She denies fevers, chills, fatigue, recent infections, weight loss, and night sweats. She has not had any headaches, visual changes, dizziness, and falls. No chest pain, heart palpitations, cough and shortness of  breath reported. Denies GI problems such as nausea, vomiting, diarrhea, and constipation. She has no reports of blood in stools, dysuria and hematuria. No depression or anxiety reported today. She is taking all medications as prescribed. She denies pain today.    Observations/Objective:  Telephone Virtual Visit   Assessment and Plan:  1. Hospital discharge follow-up  2. Syncope, unspecified syncope type Stable today.   3. Abnormal finding on CT scan 07/16/2020. Possible lesion. Will will order MRI for further assessment.   4. Dizziness  5. Numbness and tingling of left lower extremity  6. Hypokalemia - potassium chloride SA (KLOR-CON) 20 MEQ tablet; Take 1 tablet (20 mEq total) by mouth daily.  Dispense: 30 tablet; Refill: 1  7. Anxiety  8. Follow up She will keep follow up appointment.   Meds ordered this encounter  Medications  . potassium chloride SA (KLOR-CON) 20 MEQ tablet    Sig: Take 1 tablet (20 mEq total) by mouth daily.    Dispense:  30 tablet    Refill:  1   No orders of the defined types were placed in this encounter.   Referral Orders  No referral(s) requested today    Kathe Becton, MSN, ANE, FNP-BC [facility] [place] [address] [place], [place] [address] [phone] fax     I discussed the assessment and treatment plan with the patient. The patient was provided an opportunity to ask questions and all were answered. The patient agreed with the plan and demonstrated an understanding of the instructions.   The patient was advised to call back or seek an in-person evaluation if the symptoms worsen or if the  condition fails to improve as anticipated.  I provided 20 minutes of non-face-to-face time during this encounter.   Azzie Glatter, FNP

## 2020-07-28 ENCOUNTER — Telehealth: Payer: Self-pay | Admitting: Family Medicine

## 2020-07-28 ENCOUNTER — Encounter: Payer: Self-pay | Admitting: Family Medicine

## 2020-07-29 ENCOUNTER — Other Ambulatory Visit: Payer: Self-pay | Admitting: Family Medicine

## 2020-07-29 ENCOUNTER — Telehealth: Payer: Self-pay | Admitting: Family Medicine

## 2020-07-29 DIAGNOSIS — R0989 Other specified symptoms and signs involving the circulatory and respiratory systems: Secondary | ICD-10-CM

## 2020-07-29 MED ORDER — AZITHROMYCIN 250 MG PO TABS: 250.0000 mg | ORAL_TABLET | Freq: Every day | ORAL | 0 refills | Status: DC

## 2020-07-31 NOTE — Telephone Encounter (Signed)
Sent to provider 

## 2020-08-01 ENCOUNTER — Other Ambulatory Visit: Payer: Self-pay | Admitting: Family Medicine

## 2020-08-01 DIAGNOSIS — R55 Syncope and collapse: Secondary | ICD-10-CM

## 2020-08-01 DIAGNOSIS — R9389 Abnormal findings on diagnostic imaging of other specified body structures: Secondary | ICD-10-CM

## 2020-08-10 ENCOUNTER — Other Ambulatory Visit: Payer: Self-pay

## 2020-08-10 DIAGNOSIS — R55 Syncope and collapse: Secondary | ICD-10-CM

## 2020-08-10 DIAGNOSIS — R9389 Abnormal findings on diagnostic imaging of other specified body structures: Secondary | ICD-10-CM

## 2020-09-03 ENCOUNTER — Other Ambulatory Visit: Payer: Self-pay

## 2020-09-03 ENCOUNTER — Encounter (HOSPITAL_COMMUNITY): Payer: Self-pay | Admitting: Psychiatry

## 2020-09-03 ENCOUNTER — Telehealth (INDEPENDENT_AMBULATORY_CARE_PROVIDER_SITE_OTHER): Payer: 59 | Admitting: Psychiatry

## 2020-09-03 DIAGNOSIS — F3342 Major depressive disorder, recurrent, in full remission: Secondary | ICD-10-CM | POA: Diagnosis not present

## 2020-09-03 DIAGNOSIS — F411 Generalized anxiety disorder: Secondary | ICD-10-CM

## 2020-09-03 MED ORDER — SERTRALINE HCL 100 MG PO TABS: 100.0000 mg | ORAL_TABLET | Freq: Every day | ORAL | 0 refills | Status: DC

## 2020-09-03 NOTE — Progress Notes (Signed)
[facility] MD/PA/NP OP Progress Note  Virtual Visit via Telephone Note  I connected with Erin Zamora on 09/03/20 at  1:00 PM EDT by telephone and verified that I am speaking with the correct person using two identifiers.  Location: Patient: home Provider: Clinic   I discussed the limitations, risks, security and privacy concerns of performing an evaluation and management service by telephone and the availability of in person appointments. I also discussed with the patient that there may be a patient responsible charge related to this service. The patient expressed understanding and agreed to proceed.   I provided 14 minutes of non-face-to-face time during this encounter.      09/03/2020 1:01 PM Erin Zamora  MRN:  [id number]  Chief Complaint: " Everything is going fine."   HPI: Patient reports she is doing well.  She informed that her mood has been stable.  She has been taking sertraline every day and that has been helpful with her mood and anxiety. She informed that she was leaving from work early today because she has lot of some fever.  She is not sure of the cause because she does not have any other symptoms.  She is hoping that is not COVID again because she did contracted a few months ago. Writer noted that she was in the ED in March for an episode of syncope.  Patient followed up with her PCP following that and has been doing well since then. Patient stated that few weeks ago she went for vacation and had a good time with her family. She stated that otherwise she stays busy with work but things are progressing okay for now. She denies any other concerns about her mood or anxiety today.   Visit Diagnosis:    ICD-10-CM   1. Recurrent major depressive disorder, in full remission ([place])  F33.42   2. Generalized anxiety disorder  F41.1     Past Psychiatric History: MDD, anxiety  Past Medical History:  Past Medical History:  Diagnosis Date  . Allergy    seasonal  .  Anemia   . Anxiety   . CAP (community acquired pneumonia) 10/2018  . Cough 10/2018  . Hyperlipidemia 10/2018  . Hypokalemia   . Insomnia 10/2018  . Microcytic anemia   . Skin lesions 10/2019  . Thrombocytosis   . Vitamin D deficiency 10/2018    Past Surgical History:  Procedure Laterality Date  . APPENDECTOMY  1991  . CHOLECYSTECTOMY  2003  . TUBAL LIGATION  2003    Family Psychiatric History: see below  Family History:  Family History  Problem Relation Age of Onset  . Alcohol abuse Mother   . COPD Mother   . Hypertension Mother   . Alcohol abuse Father   . Diabetes Father   . Drug abuse Father   . Heart disease Father   . Hypertension Father   . Kidney disease Father   . Arthritis Maternal Grandmother   . Arthritis Maternal Grandfather   . Melanoma Maternal Grandfather   . Cancer Paternal Grandmother   . Diabetes Paternal Grandmother   . Heart disease Paternal Grandmother   . Diabetes Paternal Grandfather   . Heart disease Paternal Grandfather   . Kidney disease Paternal Grandfather   . Stroke Paternal Grandfather     Social History:  Social History   Socioeconomic History  . Marital status: Single    Spouse name: Not on file  . Number of children: Not on file  . Years of  education: Not on file  . Highest education level: Not on file  Occupational History  . Not on file  Tobacco Use  . Smoking status: Former Smoker    Packs/day: 0.25    Types: Cigarettes    Quit date: 10/01/2018    Years since quitting: 1.9  . Smokeless tobacco: Never Used  . Tobacco comment: 5-7 ciggs per day  Vaping Use  . Vaping Use: Never used  Substance and Sexual Activity  . Alcohol use: Yes    Alcohol/week: 0.0 standard drinks    Comment: rare  . Drug use: No  . Sexual activity: Yes    Partners: Male    Birth control/protection: None  Other Topics Concern  . Not on file  Social History Narrative   49 year old and 70 y/o daughters   88 year old granddaughter - takes  care of her in the afternoon after school   Significant other - 15 years   Work: Web designer   Social Determinants of Radio broadcast assistant Strain: Not on [employer] Insecurity: Not on file  Transportation Needs: Not on file  Physical Activity: Not on file  Stress: Not on file  Social Connections: Not on file    Allergies:  Allergies  Allergen Reactions  . Codeine Itching and Nausea And Vomiting  . Iodine     Unknown  Patient states that her mother had seizure reaction to Iodine.     Metabolic Disorder Labs: Lab Results  Component Value Date   HGBA1C 5.9 (A) 11/22/2019   HGBA1C 5.9 11/22/2019   HGBA1C 5.9 11/22/2019   HGBA1C 5.9 11/22/2019   No results found for: PROLACTIN Lab Results  Component Value Date   CHOL 168 10/23/2018   TRIG 228 (H) 10/23/2018   HDL 42 10/23/2018   CHOLHDL 4.0 10/23/2018   [place] 80 10/23/2018   Lab Results  Component Value Date   TSH 0.961 10/14/2018    Therapeutic Level Labs: No results found for: LITHIUM No results found for: VALPROATE No components found for:  CBMZ  Current Medications: Current Outpatient Medications  Medication Sig Dispense Refill  . azithromycin (ZITHROMAX) 250 MG tablet Take 1 tablet (250 mg total) by mouth daily. Take first 2 tablets together, then 1 every day until finished. 6 tablet 0  . fluticasone (FLONASE) 50 MCG/ACT nasal spray Place 2 sprays into both nostrils daily for 7 days. (Patient not taking: Reported on 07/24/2020) 1 g 0  . gabapentin (NEURONTIN) 300 MG capsule Take 1 capsule (300 mg total) by mouth 3 (three) times daily. 90 capsule 3  . ibuprofen (ADVIL) 800 MG tablet TAKE 1 TABLET BY MOUTH EVERY 8 HOURS AS NEEDED FOR MODERATE PAIN 30 tablet 0  . potassium chloride SA (KLOR-CON) 20 MEQ tablet Take 1 tablet (20 mEq total) by mouth daily. 30 tablet 1  . sertraline (ZOLOFT) 100 MG tablet Take 1 tablet (100 mg total) by mouth daily. 90 tablet 0  . Vitamin D, Ergocalciferol,  (DRISDOL) 1.25 MG (50000 UT) CAPS capsule Take 1 capsule (50,000 Units total) by mouth every 7 (seven) days. 5 capsule 6   No current facility-administered medications for this visit.     Psychiatric Specialty Exam: Review of Systems  There were no vitals taken for this visit.There is no height or weight on file to calculate BMI.  General Appearance: unable to assess due to phone visit  Eye Contact:  unable to assess due to phone visit  Speech:  Clear  and Coherent and Normal Rate  Volume:  Normal  Mood:  Anxious  Affect:  Congruent  Thought Process:  Goal Directed, Linear and Descriptions of Associations: Intact  Orientation:  Full (Time, Place, and Person)  Thought Content: Logical   Suicidal Thoughts:  No  Homicidal Thoughts:  No  Memory:  Recent;   Good Remote;   Good  Judgement:  Good  Insight:  Good  Psychomotor Activity:  Normal  Concentration:  Concentration: Good and Attention Span: Good  Recall:  Good  Fund of Knowledge: Good  Language: Good  Akathisia:  Negative  Handed:  Right  AIMS (if indicated): not done  Assets:  Communication Skills Desire for Improvement Financial Resources/Insurance Housing  ADL's:  Intact  Cognition: WNL  Sleep:  Fair     Screenings: [place] from 07/24/2020 in [facility] Office Visit from 01/24/2020 in [facility] Office Visit from 01/01/2019 in [facility] Office Visit from 11/20/2018 in [place] [facility] Office Visit from 10/23/2018 in [facility]  PHQ-2 Total Score 0 0 0 0 0    [place] [facility] from 07/16/2020 in [facility]  C-SSRS RISK CATEGORY No Risk       Assessment and Plan: Patient's mood anxiety symptoms seem to be stable.    1. Recurrent major depressive disorder, in remission ([facility])  - sertraline (ZOLOFT) 100 MG tablet; Take 1 tablet (100 mg total) by mouth daily.   Dispense: 90 tablet; Refill: 1   2. Generalized anxiety disorder  - sertraline (ZOLOFT) 100 MG tablet; Take 1 tablet (100 mg total) by mouth daily.  Dispense: 90 tablet; Refill:   Continue same regimen for now Follow-up in 3 months. Continue individual therapy. Patient was informed that her care is being transferred to a different provider and [place] psychiatry clinic.  She verbalized her understanding.   Nevada Crane, MD 09/03/2020, 1:01 PM

## 2020-10-21 ENCOUNTER — Other Ambulatory Visit (HOSPITAL_COMMUNITY): Payer: Self-pay | Admitting: Psychiatry

## 2020-10-21 DIAGNOSIS — F3342 Major depressive disorder, recurrent, in full remission: Secondary | ICD-10-CM

## 2020-10-21 DIAGNOSIS — F411 Generalized anxiety disorder: Secondary | ICD-10-CM

## 2020-10-22 IMAGING — CT CT ANGIOGRAPHY CHEST
2 of 9 series · 19 of 46 positions shown · IV contrast (omnipaque)
Comparison: None

CLINICAL DATA: Worsening dyspnea over 3-4 days, shortness of breath
which is constant and worse with exertion, orthopnea, associated
cough productive of yellow to clear sputum, intermittent RIGHT mid
back pain with coughing and deep breathing, former smoker, question
pulmonary embolism

EXAM:
CT ANGIOGRAPHY CHEST WITH CONTRAST
TECHNIQUE: Multidetector CT imaging of the chest was performed using the
standard protocol during bolus administration of intravenous
contrast. Multiplanar CT image reconstructions and MIPs were
obtained to evaluate the vascular anatomy.
CONTRAST:  75mL OMNIPAQUE IOHEXOL 350 MG/ML SOLN IV

[Series 6: thins · axial · 0.98mm/px · z∈[+923,+1179]mm · 16 of 289 slices shown]
[im 17/289  lung]
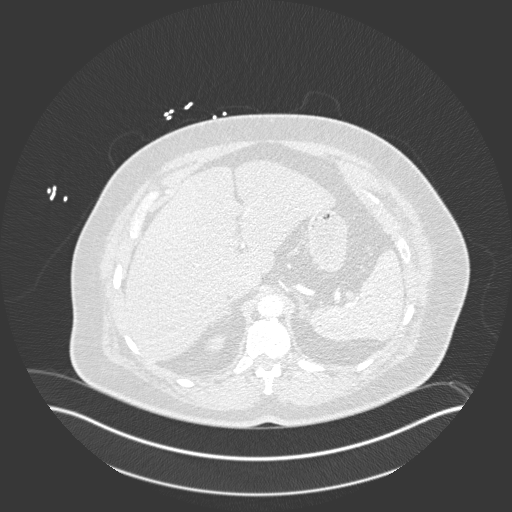
[im 33/289  soft-tissue]
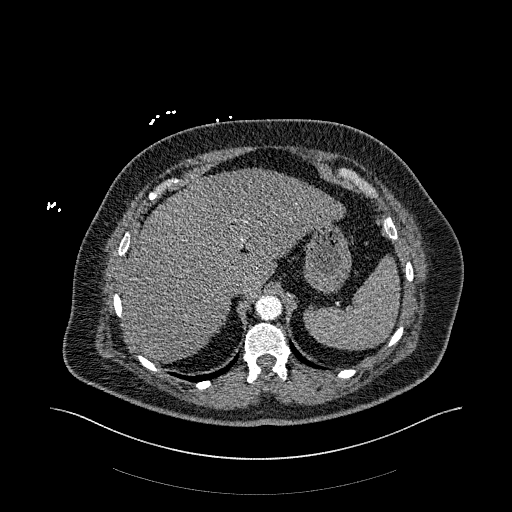
[im 49/289  lung]
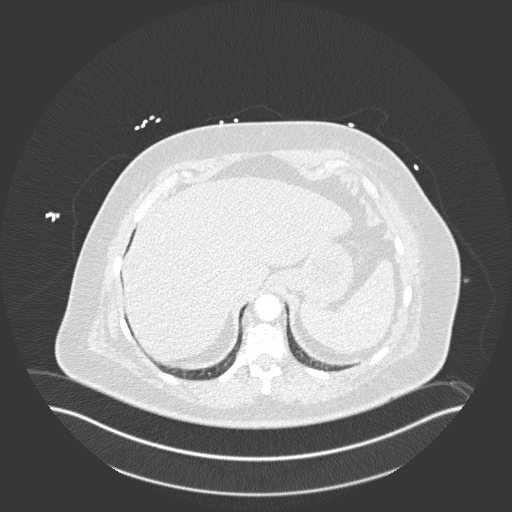
[im 65/289  soft-tissue]
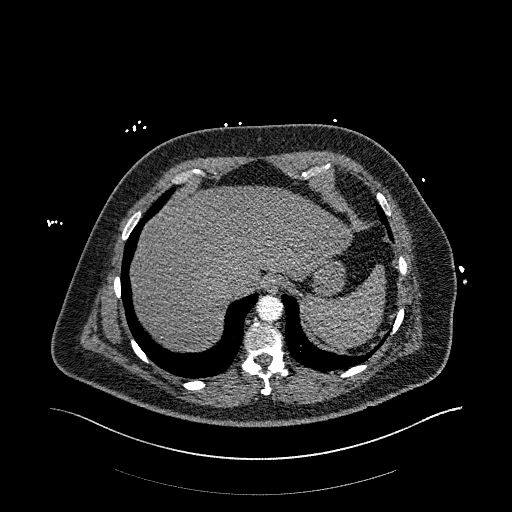
[im 81/289  lung]
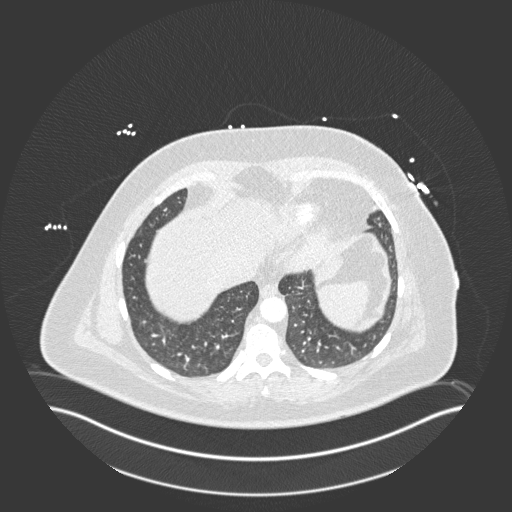
[im 97/289  soft-tissue]
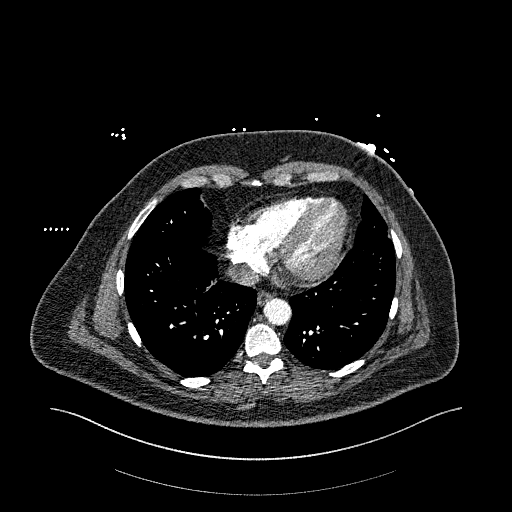
[im 113/289  lung]
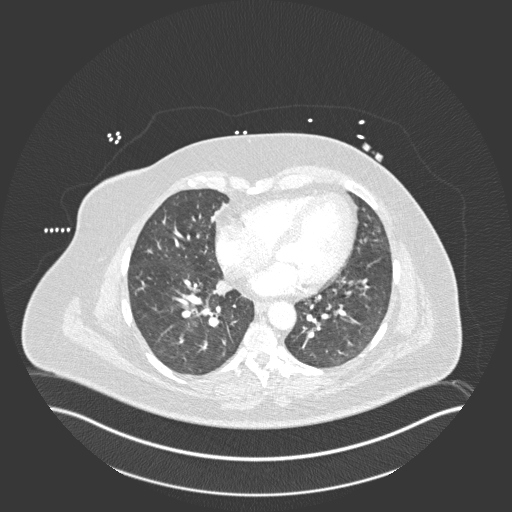
[im 129/289  soft-tissue]
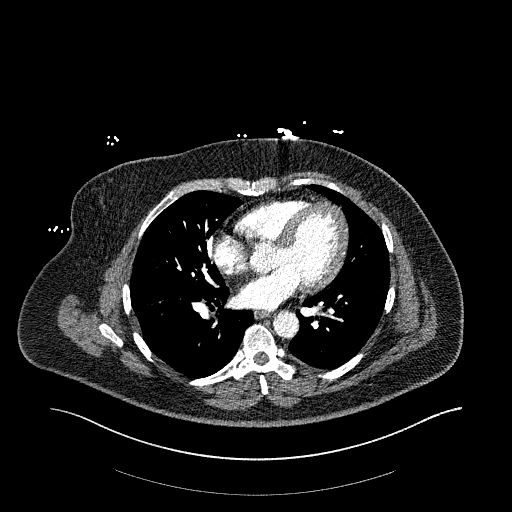
[im 161/289  lung]
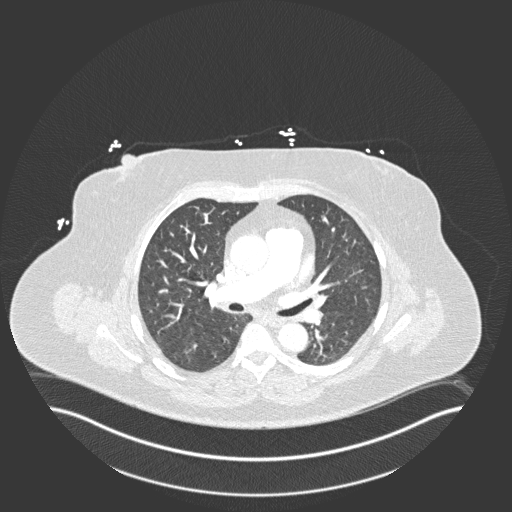
[im 177/289  soft-tissue]
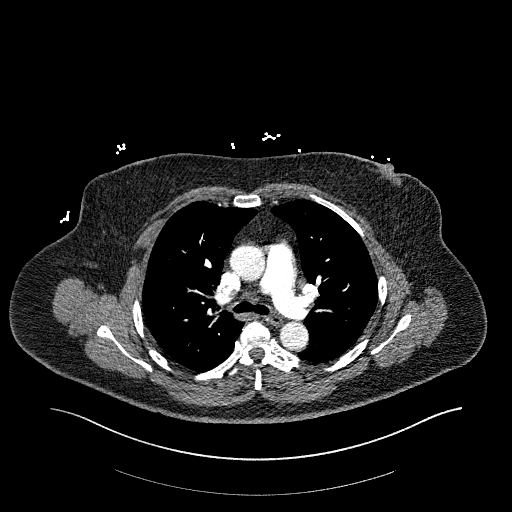
[im 193/289  lung]
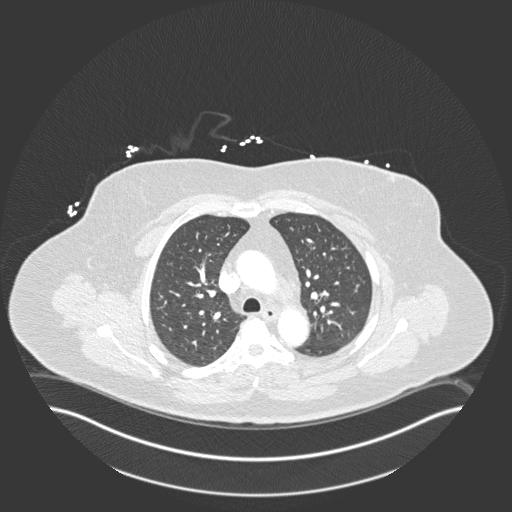
[im 209/289  soft-tissue]
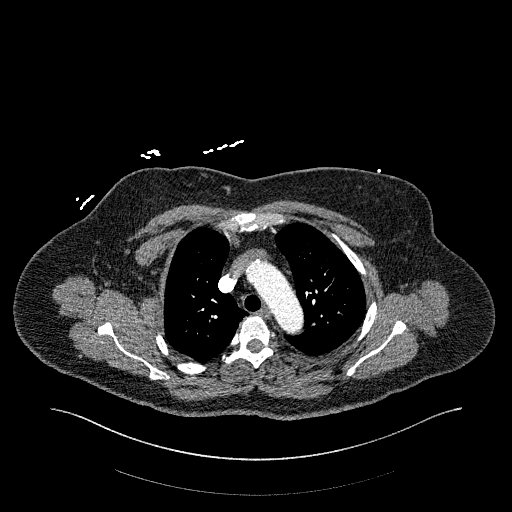
[im 225/289  lung]
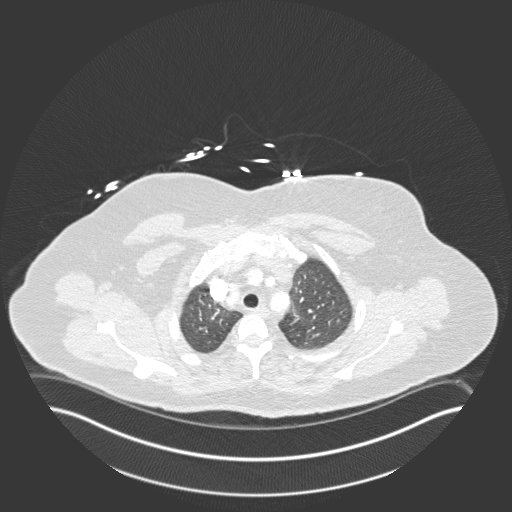
[im 241/289  soft-tissue]
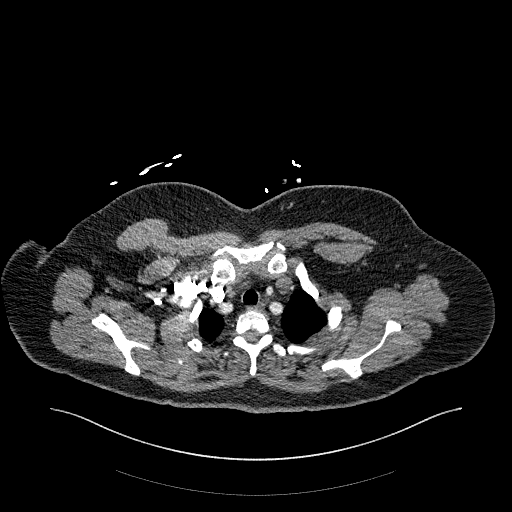
[im 257/289  lung]
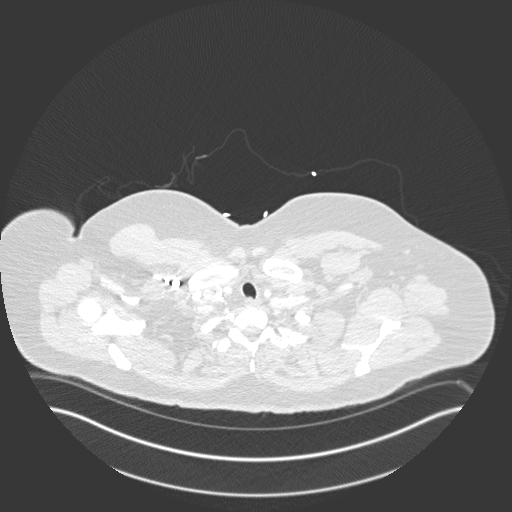
[im 273/289  soft-tissue]
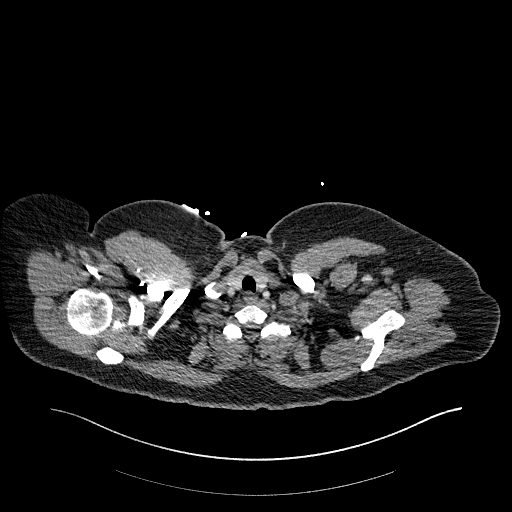

[Series 8: coronal mpr · coronal · 0.59mm/px · 3 of 151 slices shown]
[im 38/151  soft-tissue]
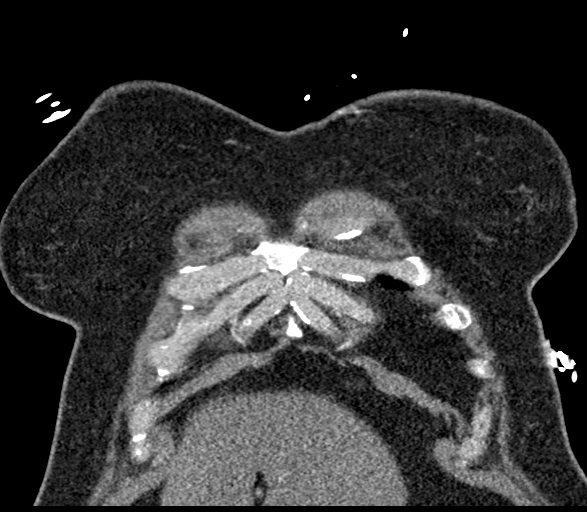
[im 76/151  soft-tissue]
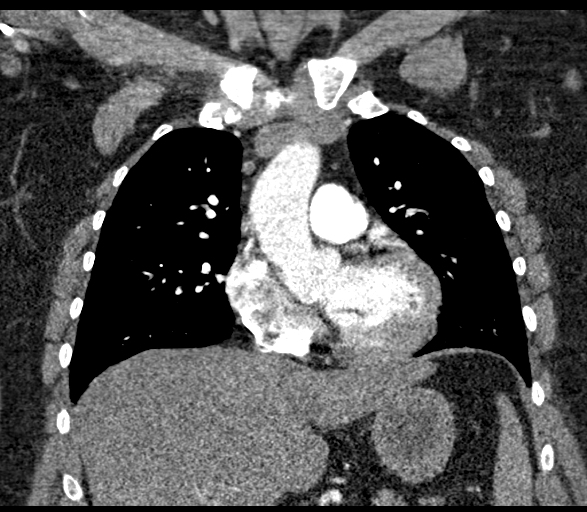
[im 113/151  soft-tissue]
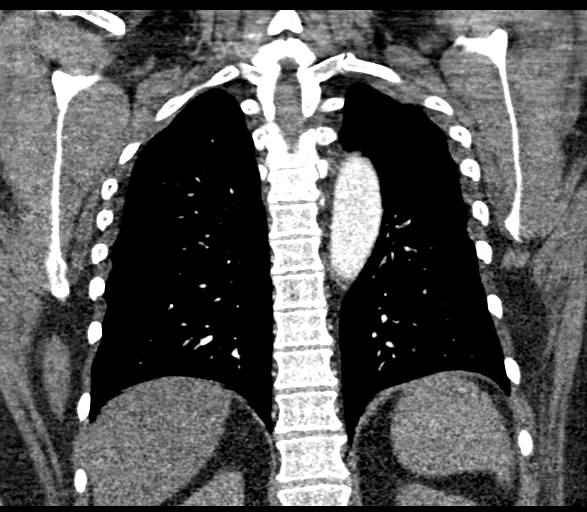

[19 of 46 positions shown; findings below may reference images not displayed]

FINDINGS: Cardiovascular: Minimal atherosclerotic calcification of thoracic
aorta and coronary arteries. Aorta upper normal caliber 3.8 cm
diameter. No aortic aneurysm or dissection. No pericardial effusion.
Pulmonary arteries adequately opacified and patent. No evidence of
pulmonary embolism.

Mediastinum/Nodes: Esophagus normal appearance. Base of cervical
region unremarkable. No thoracic adenopathy.

Lungs/Pleura: Minimal central infiltrate LEFT lower lobe. Mild
peribronchial thickening. Lungs otherwise clear. No additional
infiltrate, pleural effusion or pneumothorax.

Upper Abdomen: Unremarkable

Musculoskeletal: No acute osseous findings.

Review of the MIP images confirms the above findings.
IMPRESSION: No evidence of pulmonary embolism.

Bronchitic changes with minimal central RIGHT lower lobe infiltrate.

Aortic Atherosclerosis ([id number]).

## 2020-10-22 IMAGING — DX PORTABLE CHEST - 1 VIEW
1 series · 1 of 1 positions shown · non-contrast
Comparison: 01/29/2018

CLINICAL DATA: Coughing with low-grade fever.

EXAM:
PORTABLE CHEST 1 VIEW

[chest ap]
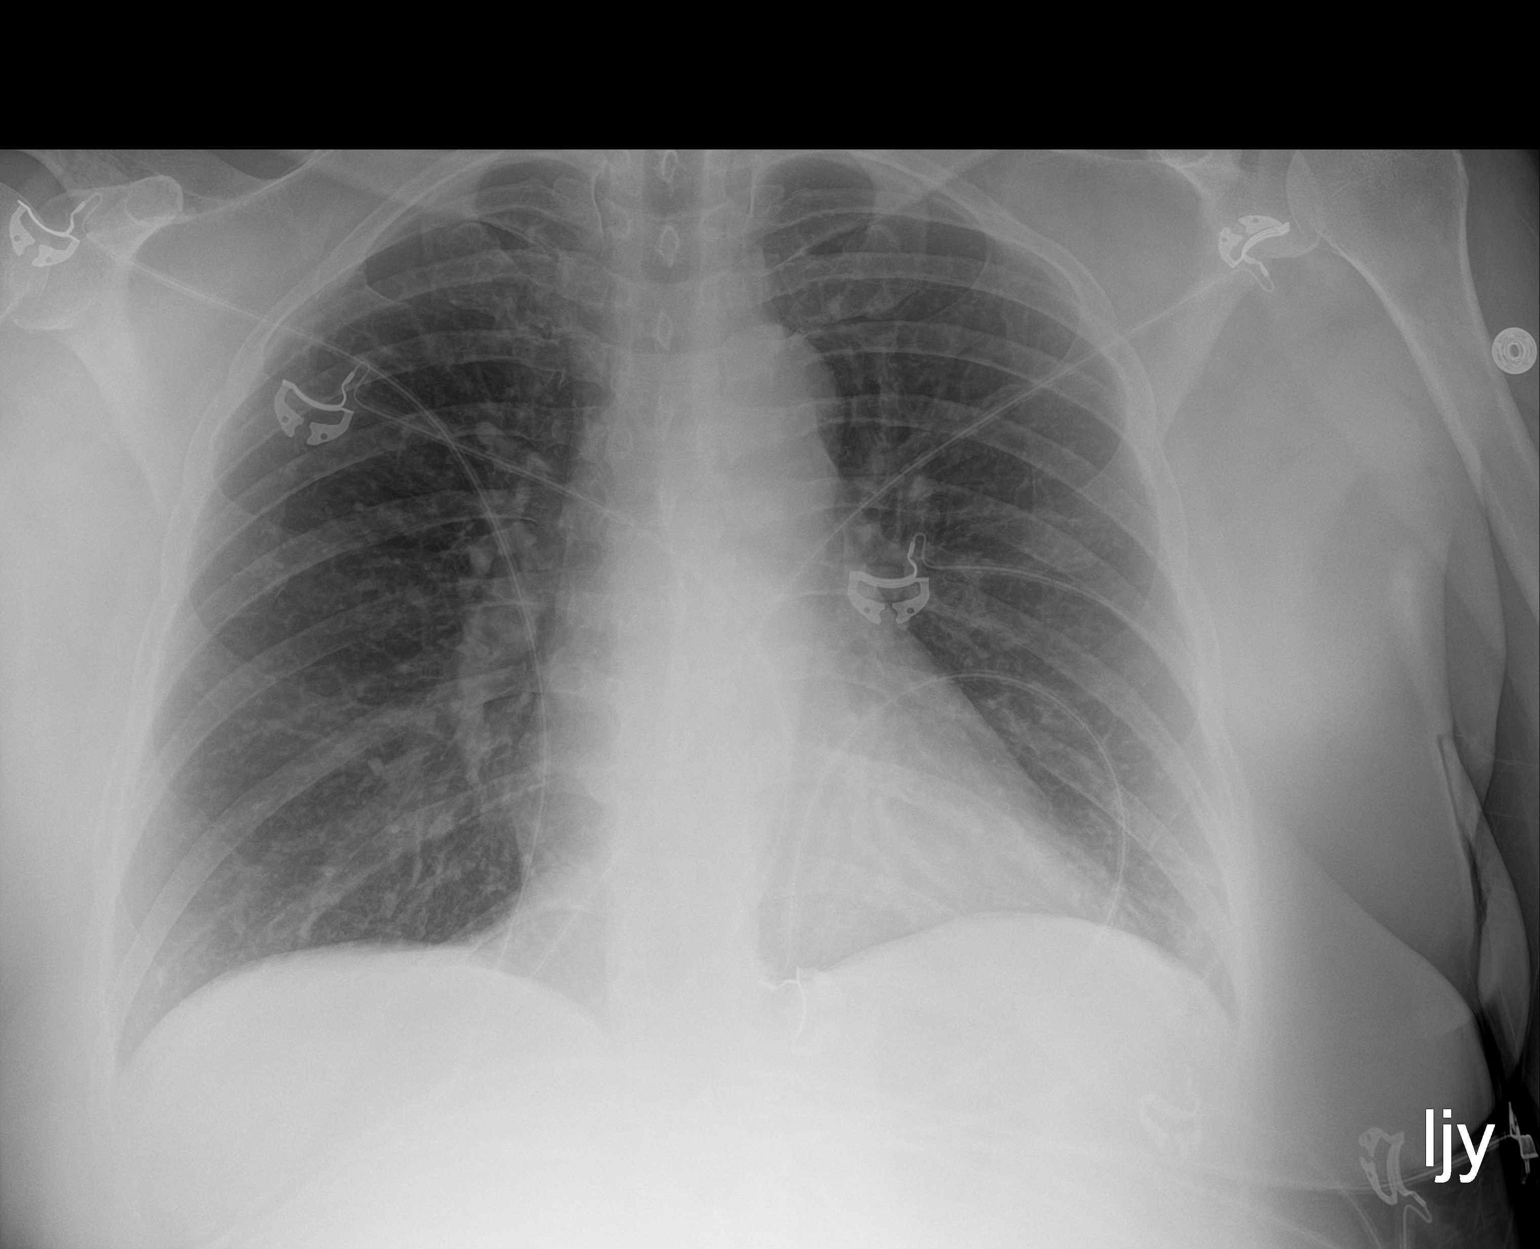

[1 of 1 positions shown; findings below may reference images not displayed]

FINDINGS: Cardiac silhouette is normal in size and configuration. No
mediastinal or hilar masses. There is no evidence of adenopathy.

Clear lungs.  No pleural effusion or pneumothorax.

Skeletal structures are grossly intact.
IMPRESSION: No active disease.

## 2020-11-23 ENCOUNTER — Other Ambulatory Visit (HOSPITAL_COMMUNITY): Payer: Self-pay | Admitting: Psychiatry

## 2020-11-23 ENCOUNTER — Telehealth (HOSPITAL_COMMUNITY): Payer: Self-pay | Admitting: *Deleted

## 2020-11-23 DIAGNOSIS — F411 Generalized anxiety disorder: Secondary | ICD-10-CM

## 2020-11-23 DIAGNOSIS — F3342 Major depressive disorder, recurrent, in full remission: Secondary | ICD-10-CM

## 2020-11-23 MED ORDER — SERTRALINE HCL 100 MG PO TABS: 100.0000 mg | ORAL_TABLET | Freq: Every day | ORAL | 0 refills | Status: DC

## 2020-11-23 NOTE — Telephone Encounter (Signed)
FORMER PATIENT OF DR Toy Care WITH NO FUTURE APPOINTMENT MADE CALLED & REQUESTED A REFILL ON  sertraline (ZOLOFT) 100 MG tablet.  DID REMIND PATIENT OF 09/03/20 LAST NOTE THAT DR [facility] Care Patient was informed that her care is being transferred to a different provider and [facility] psychiatry clinic.  She verbalized her understanding. PATIENT WAS GIVEN THE # TO [facility]. I DID INFORM THAT I WOULD ASK A PROVIDER HERE BRIDGE HER MEDICATION FOR 30 DAYS

## 2020-11-23 NOTE — Telephone Encounter (Signed)
Provider bridged patient medications.  She can follow-up at [facility].

## 2020-12-24 ENCOUNTER — Ambulatory Visit
Admission: EM | Admit: 2020-12-24 | Discharge: 2020-12-24 | Disposition: A | Discharge status: Home / Self Care | Payer: 59 | Attending: Emergency Medicine | Admitting: Emergency Medicine

## 2020-12-24 ENCOUNTER — Ambulatory Visit (INDEPENDENT_AMBULATORY_CARE_PROVIDER_SITE_OTHER): Payer: 59

## 2020-12-24 ENCOUNTER — Other Ambulatory Visit: Payer: Self-pay

## 2020-12-24 ENCOUNTER — Encounter: Payer: Self-pay | Admitting: Emergency Medicine

## 2020-12-24 DIAGNOSIS — W19XXXA Unspecified fall, initial encounter: Secondary | ICD-10-CM | POA: Diagnosis not present

## 2020-12-24 DIAGNOSIS — M79644 Pain in right finger(s): Secondary | ICD-10-CM

## 2020-12-24 DIAGNOSIS — M79641 Pain in right hand: Secondary | ICD-10-CM

## 2020-12-24 DIAGNOSIS — S6991XA Unspecified injury of right wrist, hand and finger(s), initial encounter: Secondary | ICD-10-CM

## 2020-12-24 MED ORDER — PREDNISONE 20 MG PO TABS: 20.0000 mg | ORAL_TABLET | Freq: Two times a day (BID) | ORAL | 0 refills | Status: AC

## 2020-12-24 NOTE — Discharge Instructions (Addendum)
X-rays negative for fracture or dislocation Continue conservative management of rest, ice, and elevation Thumb spica applied Prednisone prescribed Follow up with hand specialist for further evaluation and management Return or go to the ER if you have any new or worsening symptoms (fever, chills, redness, swelling, bruising, deformity, etc...)

## 2020-12-24 NOTE — ED Triage Notes (Signed)
[place] a couple of weeks ago on right hand.  Hand slightly swollen around thumb area.  Pain shoots up arm.  States it hurts to use thumb

## 2020-12-24 NOTE — ED Provider Notes (Signed)
[place]   [id number] 12/24/20 Arrival Time: 1841  CC:RT thumb PAIN  SUBJECTIVE: History from: patient. Erin Zamora is a 50 y.o. female complains of RT thumb pain and injury that occurred 2 weeks ago.  Reports fall on outstretched hand.  Localizes the pain to the base of thumb and thumb.  Describes the pain as constant, dull, sharp, throbbing, and achy in character.  Has tried OTC medications without relief.  Symptoms are made worse at night and with thumb ROM.  Denies similar symptoms in the past.  Complains of associated numbness/ tingling at night.  Denies fever, chills, erythema, ecchymosis, effusion.  ROS: As per HPI.  All other pertinent ROS negative.     Past Medical History:  Diagnosis Date   Allergy    seasonal   Anemia    Anxiety    CAP (community acquired pneumonia) 10/2018   Cough 10/2018   Hyperlipidemia 10/2018   Hypokalemia    Insomnia 10/2018   Microcytic anemia    Skin lesions 10/2019   Thrombocytosis    Vitamin D deficiency 10/2018   Past Surgical History:  Procedure Laterality Date   APPENDECTOMY  1991   CHOLECYSTECTOMY  2003   TUBAL LIGATION  2003   Allergies  Allergen Reactions   Codeine Itching and Nausea And Vomiting   Iodine     Unknown  Patient states that her mother had seizure reaction to Iodine.    No current facility-administered medications on file prior to encounter.   Current Outpatient Medications on File Prior to Encounter  Medication Sig Dispense Refill   gabapentin (NEURONTIN) 300 MG capsule Take 1 capsule (300 mg total) by mouth 3 (three) times daily. 90 capsule 3   potassium chloride SA (KLOR-CON) 20 MEQ tablet Take 1 tablet (20 mEq total) by mouth daily. 30 tablet 1   sertraline (ZOLOFT) 100 MG tablet Take 1 tablet (100 mg total) by mouth daily. 30 tablet 0   Vitamin D, Ergocalciferol, (DRISDOL) 1.25 MG (50000 UT) CAPS capsule Take 1 capsule (50,000 Units total) by mouth every 7 (seven) days. 5 capsule 6    Social History   Socioeconomic History   Marital status: Single    Spouse name: Not on file   Number of children: Not on file   Years of education: Not on file   Highest education level: Not on file  Occupational History   Not on file  Tobacco Use   Smoking status: Former    Packs/day: 0.25    Types: Cigarettes    Quit date: 10/01/2018    Years since quitting: 2.2   Smokeless tobacco: Never   Tobacco comments:    5-7 ciggs per day  Vaping Use   Vaping Use: Never used  Substance and Sexual Activity   Alcohol use: Yes    Alcohol/week: 0.0 standard drinks    Comment: rare   Drug use: No   Sexual activity: Yes    Partners: Male    Birth control/protection: None  Other Topics Concern   Not on file  Social History Narrative   49 year old and 24 y/o daughters   34 year old granddaughter - takes care of her in the afternoon after school   Significant other - 15 years   Work: Web designer   Social Determinants of Radio broadcast assistant Strain: Not on file  Food Insecurity: Not on file  Transportation Needs: Not on file  Physical Activity: Not on file  Stress: Not  on file  Social Connections: Not on file  Intimate Partner Violence: Not on file   Family History  Problem Relation Age of Onset   Alcohol abuse Mother    COPD Mother    Hypertension Mother    Alcohol abuse Father    Diabetes Father    Drug abuse Father    Heart disease Father    Hypertension Father    Kidney disease Father    Arthritis Maternal Grandmother    Arthritis Maternal Grandfather    Melanoma Maternal Grandfather    Cancer Paternal Grandmother    Diabetes Paternal Grandmother    Heart disease Paternal Grandmother    Diabetes Paternal Grandfather    Heart disease Paternal Grandfather    Kidney disease Paternal Grandfather    Stroke Paternal Grandfather     OBJECTIVE:  Vitals:   12/24/20 1851  BP: 138/88  Pulse: 88  Resp: 16  Temp: 98.7 F (37.1 C)  TempSrc: Oral   SpO2: 95%    General appearance: ALERT; in no acute distress.  Head: NCAT Lungs: Normal respiratory effort CV: Radial pulse 2+; cap refill < 2 secs Musculoskeletal: RT hand Inspection: Thumb with swelling, slight ecchymosis Palpation: diffusely TTP over 1st digit, thenar prominence, base of thumb and snuff box  ROM: LROM; clicking with extension and flexion about the DIP joint of first digit Strength: deferred Skin: warm and dry Neurologic: Ambulates without difficulty Psychological: alert and cooperative; normal mood and affect  DIAGNOSTIC STUDIES:  DG Hand Complete Right  Result Date: 12/24/2020 CLINICAL DATA:  Fall 2 weeks ago with right hand pain, initial encounter EXAM: RIGHT HAND - COMPLETE 3+ VIEW COMPARISON:  None. FINDINGS: There is no evidence of fracture or dislocation. There is no evidence of arthropathy or other focal bone abnormality. Soft tissues are unremarkable. IMPRESSION: No acute abnormality noted. Electronically Signed   By: Inez Catalina M.D.   On: 12/24/2020 19:09     ASSESSMENT & PLAN:  1. Thumb pain, right   2. Injury of right thumb, initial encounter     Meds ordered this encounter  Medications   predniSONE (DELTASONE) 20 MG tablet    Sig: Take 1 tablet (20 mg total) by mouth 2 (two) times daily with a meal for 5 days.    Dispense:  10 tablet    Refill:  0    Order Specific Question:   Supervising Provider    Answer:   Raylene Everts [id number]   X-rays negative for fracture or dislocation Continue conservative management of rest, ice, and elevation Thumb spica applied Prednisone prescribed Follow up with hand specialist for further evaluation and management Return or go to the ER if you have any new or worsening symptoms (fever, chills, redness, swelling, bruising, deformity, etc...)   Reviewed expectations re: course of current medical issues. Questions answered. Outlined signs and symptoms indicating need for more acute  intervention. Patient verbalized understanding. After Visit Summary given.     Lestine Box, PA-C 12/24/20 1929

## 2020-12-29 ENCOUNTER — Encounter: Payer: Self-pay | Admitting: Oncology

## 2021-01-04 ENCOUNTER — Encounter (HOSPITAL_BASED_OUTPATIENT_CLINIC_OR_DEPARTMENT_OTHER): Payer: Self-pay | Admitting: Emergency Medicine

## 2021-01-04 ENCOUNTER — Other Ambulatory Visit: Payer: Self-pay

## 2021-01-04 ENCOUNTER — Emergency Department (HOSPITAL_BASED_OUTPATIENT_CLINIC_OR_DEPARTMENT_OTHER)
Admission: EM | Admit: 2021-01-04 | Discharge: 2021-01-04 | Disposition: A | Discharge status: Home / Self Care | Payer: 59 | Attending: Emergency Medicine | Admitting: Emergency Medicine

## 2021-01-04 DIAGNOSIS — M5440 Lumbago with sciatica, unspecified side: Secondary | ICD-10-CM | POA: Diagnosis not present

## 2021-01-04 DIAGNOSIS — Z87891 Personal history of nicotine dependence: Secondary | ICD-10-CM | POA: Insufficient documentation

## 2021-01-04 DIAGNOSIS — G8929 Other chronic pain: Secondary | ICD-10-CM | POA: Insufficient documentation

## 2021-01-04 MED ORDER — ORPHENADRINE CITRATE ER 100 MG PO TB12
100.0000 mg | ORAL_TABLET | Freq: Once | ORAL | Status: DC
  Filled 2021-01-04: qty 1

## 2021-01-04 MED ORDER — ACETAMINOPHEN 325 MG PO TABS
650.0000 mg | ORAL_TABLET | Freq: Once | ORAL | Status: AC
  Administered 2021-01-04: 650 mg via ORAL
  Filled 2021-01-04: qty 2

## 2021-01-04 MED ORDER — METHOCARBAMOL 500 MG PO TABS: 1000.0000 mg | ORAL_TABLET | Freq: Two times a day (BID) | ORAL | 0 refills | Status: AC

## 2021-01-04 MED ORDER — ORPHENADRINE CITRATE 30 MG/ML IJ SOLN: 60.0000 mg | Freq: Once | INTRAMUSCULAR | Status: DC

## 2021-01-04 MED ORDER — ACETAMINOPHEN 325 MG PO TABS: 650.0000 mg | ORAL_TABLET | Freq: Four times a day (QID) | ORAL | 0 refills | Status: AC | PRN

## 2021-01-04 MED ORDER — KETOROLAC TROMETHAMINE 60 MG/2ML IM SOLN
60.0000 mg | Freq: Once | INTRAMUSCULAR | Status: AC
  Administered 2021-01-04: 60 mg via INTRAMUSCULAR
  Filled 2021-01-04: qty 2

## 2021-01-04 MED ORDER — LIDOCAINE 5 % EX PTCH
1.0000 | MEDICATED_PATCH | Freq: Once | CUTANEOUS | Status: DC
  Administered 2021-01-04: 1 via TRANSDERMAL
  Filled 2021-01-04: qty 1

## 2021-01-04 MED ORDER — METHOCARBAMOL 500 MG PO TABS
1000.0000 mg | ORAL_TABLET | Freq: Once | ORAL | Status: AC
  Administered 2021-01-04: 1000 mg via ORAL
  Filled 2021-01-04: qty 2

## 2021-01-04 MED ORDER — LIDOCAINE 5 % EX PTCH: 1.0000 | MEDICATED_PATCH | Freq: Every day | CUTANEOUS | 0 refills | Status: DC | PRN

## 2021-01-04 NOTE — ED Triage Notes (Signed)
Pt from home c/o a herniated disk. Pt complains of constant pain in her back with that is radiating down both legs Pt complains of more pain in the right leg than the left. Pain started Saturday and gotten progressively.

## 2021-01-04 NOTE — ED Provider Notes (Signed)
[facility] EMERGENC[facility] DEPT Provider Note   CSN: [id number] Arrival date & time: 01/04/21  1032     History Chief Complaint  Patient presents with   Back Pain    Erin Zamora is a 50 y.o. female.  50 year old female presents to the ER secondary to low back pain.  Medical history as listed below; significant for lumbar disc protrusion, chronic low back pain.  Patient reports over the past week she has had progressive worsening of her chronic underlying back pain.  She has been taking Motrin intermittently without significant provement her symptoms.  Last took Motrin yesterday evening, 800 milligrams.  Pain localized to her right lower lumbar region.  She does have some mild sciatica.  Typical of her chronic back pain.  She is having difficulty lying flat secondary to back pain.  Pain relieved with elevating her knees and ambulation.  Worsened with lying flat and bending over.  Pain is sharp, stabbing.  No acute neurologic deficits.  No saddle paresthesias.  No urinary overflow or incontinence of bowel.  No IV drug use.  No recent spinal injections.  The history is provided by the patient. No language interpreter was used.  Back Pain Associated symptoms: no abdominal pain, no chest pain, no fever and no headaches       Past Medical History:  Diagnosis Date   Allergy    seasonal   Anemia    Anxiety    CAP (community acquired pneumonia) 10/2018   Cough 10/2018   Hyperlipidemia 10/2018   Hypokalemia    Insomnia 10/2018   Microcytic anemia    Skin lesions 10/2019   Thrombocytosis    Vitamin D deficiency 10/2018    Patient Active Problem List   Diagnosis Date Noted   Recurrent major depressive disorder, in full remission ([place]) 06/11/2020   Protrusion of lumbar intervertebral disc 03/25/2020   Spinal stenosis of lumbar region 03/25/2020   Skin lesions, generalized 12/02/2019   Recurrent major depressive disorder, in partial remission ([place]) 09/23/2019   MDD  (major depressive disorder), recurrent episode, moderate ([employer] [place]) 07/17/2019   Generalized anxiety disorder 07/17/2019   Paranoia ([place]) 05/26/2019   Chronic left-sided low back pain with left-sided sciatica 01/01/2019   Numbness and tingling of left lower extremity 01/01/2019   Iron deficiency anemia 11/09/2018   Thrombocytosis 11/03/2018   COPD with chronic bronchitis and emphysema ([place]) 10/29/2018   Cough 10/23/2018   Chest congestion 10/23/2018   Anxiety 10/23/2018   Anemia 10/23/2018   RLL pneumonia 10/13/2018   Elevated blood pressure reading 10/13/2018   Obesity (BMI 30.0-34.9) 10/13/2018   Vitamin D deficiency 10/2018    Past Surgical History:  Procedure Laterality Date   APPENDECTOMY  1991   CHOLECYSTECTOMY  2003   TUBAL LIGATION  2003     OB History     Gravida  2   Para      Term      Preterm      AB      Living  2      SAB      IAB      Ectopic      Multiple      Live Births              Family History  Problem Relation Age of Onset   Alcohol abuse Mother    COPD Mother    Hypertension Mother    Alcohol abuse Father    Diabetes Father  Drug abuse Father    Heart disease Father    Hypertension Father    Kidney disease Father    Arthritis Maternal Grandmother    Arthritis Maternal Grandfather    Melanoma Maternal Grandfather    Cancer Paternal Grandmother    Diabetes Paternal Grandmother    Heart disease Paternal Grandmother    Diabetes Paternal Grandfather    Heart disease Paternal Grandfather    Kidney disease Paternal Grandfather    Stroke Paternal Grandfather     Social History   Tobacco Use   Smoking status: Former    Packs/day: 0.25    Types: Cigarettes    Quit date: 10/01/2018    Years since quitting: 2.2   Smokeless tobacco: Never   Tobacco comments:    5-7 ciggs per day  Vaping Use   Vaping Use: Never used  Substance Use Topics   Alcohol use: Yes    Alcohol/week: 0.0 standard drinks    Comment: rare   Drug  use: No    Home Medications Prior to Admission medications   Medication Sig Start Date End Date Taking? Authorizing Provider  acetaminophen (TYLENOL) 325 MG tablet Take 2 tablets (650 mg total) by mouth every 6 (six) hours as needed. 01/04/21  Yes Wynona Dove A, DO  gabapentin (NEURONTIN) 300 MG capsule Take 1 capsule (300 mg total) by mouth 3 (three) times daily. 11/22/19  Yes Azzie Glatter, FNP  lidocaine (LIDODERM) 5 % Place 1 patch onto the skin daily as needed. Remove & Discard patch within 12 hours or as directed by MD 01/04/21  Yes Jeanell Sparrow, DO  methocarbamol (ROBAXIN) 500 MG tablet Take 2 tablets (1,000 mg total) by mouth 2 (two) times daily for 7 days. 01/04/21 01/11/21 Yes Wynona Dove A, DO  sertraline (ZOLOFT) 100 MG tablet Take 1 tablet (100 mg total) by mouth daily. 11/23/20  Yes Eulis Canner E, NP  Vitamin D, Ergocalciferol, (DRISDOL) 1.25 MG (50000 UT) CAPS capsule Take 1 capsule (50,000 Units total) by mouth every 7 (seven) days. 10/29/18  Yes Azzie Glatter, FNP  potassium chloride SA (KLOR-CON) 20 MEQ tablet Take 1 tablet (20 mEq total) by mouth daily. 07/24/20   Azzie Glatter, FNP    Allergies    Codeine and Iodine  Review of Systems   Review of Systems  Constitutional:  Negative for chills and fever.  HENT:  Negative for facial swelling and trouble swallowing.   Eyes:  Negative for photophobia and visual disturbance.  Respiratory:  Negative for cough and shortness of breath.   Cardiovascular:  Negative for chest pain and palpitations.  Gastrointestinal:  Negative for abdominal pain, nausea and vomiting.  Endocrine: Negative for polydipsia and polyuria.  Genitourinary:  Negative for difficulty urinating and hematuria.  Musculoskeletal:  Positive for back pain. Negative for gait problem and joint swelling.  Skin:  Negative for pallor and rash.  Neurological:  Negative for syncope and headaches.  Psychiatric/Behavioral:  Negative for agitation and  confusion.    Physical Exam Updated Vital Signs BP (!) 148/84 (BP Location: Left Arm)   Pulse (!) 58   Temp 98.3 F (36.8 C) (Oral)   Resp 18   Ht '5\' 5"'$  (1.651 m)   Wt 108.9 kg   LMP 12/17/2020 (Exact Date)   SpO2 96%   BMI 39.94 kg/m   Physical Exam Vitals and nursing note reviewed.  Constitutional:      General: She is not in acute distress.    Appearance:  Normal appearance.  HENT:     Head: Normocephalic and atraumatic.     Right Ear: External ear normal.     Left Ear: External ear normal.     Nose: Nose normal.     Mouth/Throat:     Mouth: Mucous membranes are moist.  Eyes:     General: No scleral icterus.       Right eye: No discharge.        Left eye: No discharge.     Extraocular Movements: Extraocular movements intact.     Pupils: Pupils are equal, round, and reactive to light.  Cardiovascular:     Rate and Rhythm: Normal rate and regular rhythm.     Pulses: Normal pulses.     Heart sounds: Normal heart sounds.  Pulmonary:     Effort: Pulmonary effort is normal. No respiratory distress.     Breath sounds: Normal breath sounds.  Abdominal:     General: Abdomen is flat.     Tenderness: There is no abdominal tenderness.  Musculoskeletal:        General: Normal range of motion.       Arms:     Cervical back: Normal range of motion.     Right lower leg: No edema.     Left lower leg: No edema.  Skin:    General: Skin is warm and dry.     Capillary Refill: Capillary refill takes less than 2 seconds.  Neurological:     General: No focal deficit present.     Mental Status: She is alert and oriented to person, place, and time.     GCS: GCS eye subscore is 4. GCS verbal subscore is 5. GCS motor subscore is 6.     Cranial Nerves: Cranial nerves are intact.     Sensory: Sensation is intact.     Motor: Motor function is intact. No tremor.     Coordination: Coordination is intact.     Gait: Gait is intact.  Psychiatric:        Mood and Affect: Mood normal.         Behavior: Behavior normal.    ED Results / Procedures / Treatments   Labs (all labs ordered are listed, but only abnormal results are displayed) Labs Reviewed - No data to display  EKG None  Radiology No results found.  Procedures Procedures   Medications Ordered in ED Medications  lidocaine (LIDODERM) 5 % 1 patch (1 patch Transdermal Patch Applied 01/04/21 1230)  ketorolac (TORADOL) injection 60 mg (60 mg Intramuscular Given 01/04/21 1232)  acetaminophen (TYLENOL) tablet 650 mg (650 mg Oral Given 01/04/21 1229)  methocarbamol (ROBAXIN) tablet 1,000 mg (1,000 mg Oral Given 01/04/21 1435)    ED Course  I have reviewed the triage vital signs and the nursing notes.  Pertinent labs & imaging results that were available during my care of the patient were reviewed by me and considered in my medical decision making (see chart for details).    MDM Rules/Calculators/A&P                            Patient presents with low back pain without signs of spinal cord compression, cauda equina syndrome, infection, aneurysm, or other serious etiology. The patient is neurologically intact. Given the extremely low risk of these diagnoses further testing and evaluation for these possibilities does not appear to be indicated at this time.   Symptoms greatly improved, she  is ambulatory.  She is tolerating oral intake without difficulty.   Detailed discussions were had with the patient and/or family and caregivers, regarding current findings, and need for close f/u with PCP or on call doctor.  Patient may benefit from evaluation by PMNR in the future.  This is been recommended as this patient's back pain is chronic.  The patient has been instructed to return immediately if the symptoms worsen in any way. Patient verbalized understanding and is in agreement with current care plan. All questions answered prior to discharge.       Final Clinical Impression(s) / ED Diagnoses Final diagnoses:   Chronic right-sided low back pain with sciatica, sciatica laterality unspecified    Rx / DC Orders ED Discharge Orders          Ordered    methocarbamol (ROBAXIN) 500 MG tablet  2 times daily        01/04/21 1421    acetaminophen (TYLENOL) 325 MG tablet  Every 6 hours PRN        01/04/21 1421    lidocaine (LIDODERM) 5 %  Daily PRN        01/04/21 1421             Wynona Dove A, DO 01/04/21 1600

## 2021-01-04 NOTE — Discharge Instructions (Addendum)
Please follow-up with your Primary Care Physician within the next week. Please take your medications as instructed and discuss any changes to your medications with your primary care physician.    Please return to the Emergency Department if you have any leg numbness, leg weakness, difficulty walking, fevers, worsening of pain, lightheadedness, lose consciousness, severe abdominal pain, severe headache, difficulty urinating, or difficulty having a bowel movement.   Please return to the emergency department immediately for any new or concerning symptoms, or if you get worse.   

## 2021-01-05 ENCOUNTER — Ambulatory Visit: Payer: Self-pay | Admitting: Nurse Practitioner

## 2021-01-05 ENCOUNTER — Encounter: Payer: Self-pay | Admitting: Nurse Practitioner

## 2021-01-05 ENCOUNTER — Ambulatory Visit (INDEPENDENT_AMBULATORY_CARE_PROVIDER_SITE_OTHER): Payer: 59 | Admitting: Nurse Practitioner

## 2021-01-05 VITALS — BP 133/77 | HR 80 | Temp 98.1°F | Ht 65.0 in | Wt 245.6 lb

## 2021-01-05 DIAGNOSIS — F411 Generalized anxiety disorder: Secondary | ICD-10-CM

## 2021-01-05 DIAGNOSIS — F3342 Major depressive disorder, recurrent, in full remission: Secondary | ICD-10-CM

## 2021-01-05 DIAGNOSIS — E876 Hypokalemia: Secondary | ICD-10-CM

## 2021-01-05 DIAGNOSIS — Z0000 Encounter for general adult medical examination without abnormal findings: Secondary | ICD-10-CM

## 2021-01-05 DIAGNOSIS — F419 Anxiety disorder, unspecified: Secondary | ICD-10-CM | POA: Diagnosis not present

## 2021-01-05 DIAGNOSIS — D229 Melanocytic nevi, unspecified: Secondary | ICD-10-CM

## 2021-01-05 DIAGNOSIS — G8929 Other chronic pain: Secondary | ICD-10-CM

## 2021-01-05 DIAGNOSIS — R200 Anesthesia of skin: Secondary | ICD-10-CM

## 2021-01-05 DIAGNOSIS — M5442 Lumbago with sciatica, left side: Secondary | ICD-10-CM

## 2021-01-05 DIAGNOSIS — F32A Depression, unspecified: Secondary | ICD-10-CM | POA: Diagnosis not present

## 2021-01-05 DIAGNOSIS — E559 Vitamin D deficiency, unspecified: Secondary | ICD-10-CM

## 2021-01-05 DIAGNOSIS — D509 Iron deficiency anemia, unspecified: Secondary | ICD-10-CM

## 2021-01-05 DIAGNOSIS — R202 Paresthesia of skin: Secondary | ICD-10-CM

## 2021-01-05 LAB — POCT URINALYSIS DIP (CLINITEK)
Bilirubin, UA: NEGATIVE
Glucose, UA: NEGATIVE mg/dL
Ketones, POC UA: NEGATIVE mg/dL
Leukocytes, UA: NEGATIVE
Nitrite, UA: NEGATIVE
POC PROTEIN,UA: 30 — AB
Spec Grav, UA: >=1.03 — AB (ref 1.010–1.025)
Urobilinogen, UA: 0.2 E.U./dL
pH, UA: 6.5 (ref 5.0–8.0)

## 2021-01-05 MED ORDER — VITAMIN D (ERGOCALCIFEROL) 1.25 MG (50000 UNIT) PO CAPS: 50000.0000 [IU] | ORAL_CAPSULE | ORAL | 6 refills | Status: DC

## 2021-01-05 MED ORDER — GABAPENTIN 300 MG PO CAPS: 300.0000 mg | ORAL_CAPSULE | Freq: Three times a day (TID) | ORAL | 3 refills | Status: DC

## 2021-01-05 MED ORDER — METHYLPREDNISOLONE SODIUM SUCC 40 MG IJ SOLR
40.0000 mg | Freq: Once | INTRAMUSCULAR | Status: AC
  Administered 2021-01-05: 40 mg via INTRAMUSCULAR

## 2021-01-05 MED ORDER — SERTRALINE HCL 100 MG PO TABS: 100.0000 mg | ORAL_TABLET | Freq: Every day | ORAL | 3 refills | Status: DC

## 2021-01-05 MED ORDER — POTASSIUM CHLORIDE CRYS ER 20 MEQ PO TBCR: 20.0000 meq | EXTENDED_RELEASE_TABLET | Freq: Every day | ORAL | 1 refills | Status: DC

## 2021-01-05 NOTE — Progress Notes (Signed)
Erin Zamora, [place]  [address] Phone:  [phone]   Fax:  [phone] Subjective:   Patient ID: Erin Zamora, female    DOB: 1970-11-02, 50 y.o.   MRN: [id number]  Chief Complaint  Patient presents with   Follow-up    Pt is here today to do her annual wellness exam. Pt is also wanting to see if she can get a refill on her Zoloft. Pt is taking Zoloft for depression/anxiety.   HPI Erin Zamora 50 y.o. female presents with for follow up of chronic illness and acute on chronic back pain. Patient states that she has had chronic back pain that worsened 2 days after bending forward. Was treated yesterday in the ED and given pain injection and prescriptions. She states that her prescriptions were filled today, so she has not started taking them. Currently rates pain 6-7/10 and describes as throbbing. Pain in lower back, non radiating and increases with movement. States that she has history of herniated disc and plans to schedule follow up with orthopedics.  Patient also concerned about mole in middle of forehead. Has has for 3 yrs and it has somewhat increased in size. Concerned it may be cancerous. Requesting referral to dermatology for biopsy and removal.  When questioned about her anxiety, patient states that Zoloft is effective in managing depression, but not anxiety. Sometimes smokes marijuana to help with managing anxiety. She has had an increase in stressors/ triggers recently. Has not had therapy in 2 mths, currently looking for a new therapist. Her previous therapist provided her with several tools to manage her anxiety, which she continues to use. Requesting a refill of Zoloft.   Compliant with all medications and denies any other complaints today.   Past Medical History:  Diagnosis Date   Allergy    seasonal   Anemia    Anxiety    CAP (community acquired pneumonia) 10/2018   Cough 10/2018   Hyperlipidemia 10/2018    Hypokalemia    Insomnia 10/2018   Microcytic anemia    Skin lesions 10/2019   Thrombocytosis    Vitamin D deficiency 10/2018    Past Surgical History:  Procedure Laterality Date   APPENDECTOMY  1991   CHOLECYSTECTOMY  2003   TUBAL LIGATION  2003    Family History  Problem Relation Age of Onset   Alcohol abuse Mother    COPD Mother    Hypertension Mother    Alcohol abuse Father    Diabetes Father    Drug abuse Father    Heart disease Father    Hypertension Father    Kidney disease Father    Arthritis Maternal Grandmother    Arthritis Maternal Grandfather    Melanoma Maternal Grandfather    Cancer Paternal Grandmother    Diabetes Paternal Grandmother    Heart disease Paternal Grandmother    Diabetes Paternal Grandfather    Heart disease Paternal Grandfather    Kidney disease Paternal Grandfather    Stroke Paternal Grandfather     Social History   Socioeconomic History   Marital status: Single    Spouse name: Not on file   Number of children: Not on file   Years of education: Not on file   Highest education level: Not on file  Occupational History   Not on file  Tobacco Use   Smoking status: Every Day    Packs/day: 0.50    Types: Cigarettes    Last attempt to  quit: 10/01/2018    Years since quitting: 2.2   Smokeless tobacco: Never   Tobacco comments:    5-7 ciggs per day  Vaping Use   Vaping Use: Never used  Substance and Sexual Activity   Alcohol use: Yes    Alcohol/week: 0.0 standard drinks    Comment: rare   Drug use: No   Sexual activity: Yes    Partners: Male    Birth control/protection: None  Other Topics Concern   Not on file  Social History Narrative   49 year old and 59 y/o daughters   47 year old granddaughter - takes care of her in the afternoon after school   Significant other - 15 years   Work: Web designer   Social Determinants of Radio broadcast assistant Strain: Not on Art therapist Insecurity: Not on file   Transportation Needs: Not on file  Physical Activity: Not on file  Stress: Not on file  Social Connections: Not on file  Intimate Partner Violence: Not on file    Outpatient Medications Prior to Visit  Medication Sig Dispense Refill   acetaminophen (TYLENOL) 325 MG tablet Take 2 tablets (650 mg total) by mouth every 6 (six) hours as needed. 36 tablet 0   lidocaine (LIDODERM) 5 % Place 1 patch onto the skin daily as needed. Remove & Discard patch within 12 hours or as directed by MD 15 patch 0   methocarbamol (ROBAXIN) 500 MG tablet Take 2 tablets (1,000 mg total) by mouth 2 (two) times daily for 7 days. 28 tablet 0   predniSONE (DELTASONE) 20 MG tablet Take 20 mg by mouth 2 (two) times daily.     gabapentin (NEURONTIN) 300 MG capsule Take 1 capsule (300 mg total) by mouth 3 (three) times daily. 90 capsule 3   potassium chloride SA (KLOR-CON) 20 MEQ tablet Take 1 tablet (20 mEq total) by mouth daily. 30 tablet 1   sertraline (ZOLOFT) 100 MG tablet Take 1 tablet (100 mg total) by mouth daily. 30 tablet 0   Vitamin D, Ergocalciferol, (DRISDOL) 1.25 MG (50000 UT) CAPS capsule Take 1 capsule (50,000 Units total) by mouth every 7 (seven) days. 5 capsule 6   No facility-administered medications prior to visit.    Allergies  Allergen Reactions   Codeine Itching and Nausea And Vomiting   Iodine     Unknown  Patient states that her mother had seizure reaction to Iodine.     Review of Systems  Constitutional:  Negative for chills, fever and malaise/fatigue.  Eyes: Negative.   Respiratory:  Negative for cough and shortness of breath.   Cardiovascular:  Negative for chest pain, palpitations and leg swelling.  Gastrointestinal:  Negative for abdominal pain, blood in stool, constipation, diarrhea, nausea and vomiting.  Musculoskeletal:  Positive for back pain.  Skin:        Forehead mole  Neurological: Negative.   Psychiatric/Behavioral:  Positive for depression. The patient is  nervous/anxious.   All other systems reviewed and are negative.     Objective:    Physical Exam Vitals reviewed.  Constitutional:      General: She is not in acute distress.    Appearance: Normal appearance.  HENT:     Head: Normocephalic.  Eyes:     Extraocular Movements: Extraocular movements intact.     Conjunctiva/sclera: Conjunctivae normal.     Pupils: Pupils are equal, round, and reactive to light.  Cardiovascular:     Rate and Rhythm: Normal rate  and regular rhythm.     Pulses: Normal pulses.     Heart sounds: Normal heart sounds.     Comments: No obvious peripheral edema Pulmonary:     Effort: Pulmonary effort is normal.     Breath sounds: Normal breath sounds.  Musculoskeletal:        General: Normal range of motion.     Cervical back: Normal range of motion.     Comments: Moderate tenderness noted with palpation of the diffuse lower lumbar  Skin:    General: Skin is warm and dry.     Capillary Refill: Capillary refill takes less than 2 seconds.     Comments: 1 cm nevus noted to mid forehead, no erythema or other discoloration. Non tender to palpation  Neurological:     General: No focal deficit present.     Mental Status: She is alert and oriented to person, place, and time.  Psychiatric:        Mood and Affect: Mood normal.        Behavior: Behavior normal.        Thought Content: Thought content normal.        Judgment: Judgment normal.    BP 133/77   Pulse 80   Temp 98.1 F (36.7 C)   Ht '5\' 5"'$  (1.651 m)   Wt 245 lb 9.6 oz (111.4 kg)   LMP 12/17/2020 (Exact Date)   SpO2 95%   BMI 40.87 kg/m  Wt Readings from Last 3 Encounters:  01/05/21 245 lb 9.6 oz (111.4 kg)  01/04/21 240 lb (108.9 kg)  07/24/20 240 lb (108.9 kg)     There is no immunization history on file for this patient.  Diabetic Foot Exam - Simple   No data filed     Lab Results  Component Value Date   TSH 0.961 10/14/2018   Lab Results  Component Value Date   WBC 9.2  07/16/2020   HGB 12.4 07/16/2020   HCT 39.0 07/16/2020   MCV 83.9 07/16/2020   PLT 469 (H) 07/16/2020   Lab Results  Component Value Date   NA 138 07/16/2020   K 3.3 (L) 07/16/2020   CO2 28 07/16/2020   GLUCOSE 110 (H) 07/16/2020   BUN 12 07/16/2020   CREATININE 0.60 07/16/2020   BILITOT 0.3 07/16/2020   ALKPHOS 70 07/16/2020   AST 16 07/16/2020   ALT 18 07/16/2020   PROT 6.9 07/16/2020   ALBUMIN 4.2 07/16/2020   CALCIUM 8.9 07/16/2020   ANIONGAP 10 07/16/2020   Lab Results  Component Value Date   CHOL 168 10/23/2018   Lab Results  Component Value Date   HDL 42 10/23/2018   Lab Results  Component Value Date   LDLCALC 80 10/23/2018   Lab Results  Component Value Date   TRIG 228 (H) 10/23/2018   Lab Results  Component Value Date   CHOLHDL 4.0 10/23/2018   Lab Results  Component Value Date   HGBA1C 5.9 (A) 11/22/2019   HGBA1C 5.9 11/22/2019   HGBA1C 5.9 11/22/2019   HGBA1C 5.9 11/22/2019       Assessment & Plan:   Problem List Items Addressed This Visit       Nervous and Auditory   Chronic left-sided low back pain with left-sided sciatica   Relevant Medications   predniSONE (DELTASONE) 20 MG tablet   sertraline (ZOLOFT) 100 MG tablet   gabapentin (NEURONTIN) 300 MG capsule Encouraged patient to follow up with orthopedics and take previously prescribed  medications for pain management      Other   Anxiety   Relevant Medications   sertraline (ZOLOFT) 100 MG tablet Encouraged patient to make an appointment with a new counselor to assist with management of anxiety    Vitamin D deficiency   Relevant Medications   Vitamin D, Ergocalciferol, (DRISDOL) 1.25 MG (50000 UNIT) CAPS capsule   Iron deficiency anemia   Relevant Orders   CBC with Differential/Platelet   Numbness and tingling of left lower extremity   Relevant Medications   gabapentin (NEURONTIN) 300 MG capsule   Generalized anxiety disorder   Relevant Medications   sertraline (ZOLOFT) 100  MG tablet   Recurrent major depressive disorder, in full remission (HCC)   Relevant Medications   sertraline (ZOLOFT) 100 MG tablet   Other Visit Diagnoses     Healthcare maintenance    -  Primary   Relevant Orders   Lipid panel   Hemoglobin A1c   POCT URINALYSIS DIP (CLINITEK) (Completed)   CBC with Differential/Platelet   Comprehensive metabolic panel   Ambulatory referral to Gastroenterology   Depression, unspecified depression type       Relevant Medications   sertraline (ZOLOFT) 100 MG tablet   Nevus       Relevant Orders   Ambulatory referral to Dermatology   Hypokalemia       Relevant Medications   potassium chloride SA (KLOR-CON) 20 MEQ tablet   Follow up in 3 mths for pap smear and reevaluation of chronic illness     I have changed Anniebell L. Aliberti's Vitamin D (Ergocalciferol). I am also having her maintain her methocarbamol, acetaminophen, lidocaine, predniSONE, sertraline, gabapentin, and potassium chloride SA. We administered methylPREDNISolone sodium succinate.  Meds ordered this encounter  Medications   sertraline (ZOLOFT) 100 MG tablet    Sig: Take 1 tablet (100 mg total) by mouth daily.    Dispense:  30 tablet    Refill:  3   methylPREDNISolone sodium succinate (SOLU-MEDROL) 40 mg/mL injection 40 mg   gabapentin (NEURONTIN) 300 MG capsule    Sig: Take 1 capsule (300 mg total) by mouth 3 (three) times daily.    Dispense:  90 capsule    Refill:  3   potassium chloride SA (KLOR-CON) 20 MEQ tablet    Sig: Take 1 tablet (20 mEq total) by mouth daily.    Dispense:  30 tablet    Refill:  1   Vitamin D, Ergocalciferol, (DRISDOL) 1.25 MG (50000 UNIT) CAPS capsule    Sig: Take 1 capsule (50,000 Units total) by mouth every 7 (seven) days.    Dispense:  5 capsule    Refill:  6     Teena Dunk, NP

## 2021-01-05 NOTE — Patient Instructions (Signed)
You were seen today in the [facility] for physical exam and follow up. Labs were collected, results will be available via MyChart or, if abnormal, you will be contacted by clinic staff. Your medications were refill, please continue taking as directed. Please follow up in 3 mths for pap smear.  Referral sent to dermatology at your request.

## 2021-01-06 LAB — CBC WITH DIFFERENTIAL/PLATELET
Basophils Absolute: 0.1 10*3/uL (ref 0.0–0.2)
Basos: 1 %
EOS (ABSOLUTE): 0.3 10*3/uL (ref 0.0–0.4)
Eos: 2 %
Hematocrit: 40.4 % (ref 34.0–46.6)
Hemoglobin: 12.7 g/dL (ref 11.1–15.9)
Immature Grans (Abs): 0.1 10*3/uL (ref 0.0–0.1)
Immature Granulocytes: 1 %
Lymphocytes Absolute: 2.7 10*3/uL (ref 0.7–3.1)
Lymphs: 23 %
MCH: 24.2 pg — ABNORMAL LOW (ref 26.6–33.0)
MCHC: 31.4 g/dL — ABNORMAL LOW (ref 31.5–35.7)
MCV: 77 fL — ABNORMAL LOW (ref 79–97)
Monocytes Absolute: 0.9 10*3/uL (ref 0.1–0.9)
Monocytes: 8 %
Neutrophils Absolute: 7.4 10*3/uL — ABNORMAL HIGH (ref 1.4–7.0)
Neutrophils: 65 %
Platelets: 598 10*3/uL — ABNORMAL HIGH (ref 150–450)
RBC: 5.24 x10E6/uL (ref 3.77–5.28)
RDW: 15.8 % — ABNORMAL HIGH (ref 11.7–15.4)
WBC: 11.5 10*3/uL — ABNORMAL HIGH (ref 3.4–10.8)

## 2021-01-06 LAB — COMPREHENSIVE METABOLIC PANEL
ALT: 14 IU/L (ref 0–32)
AST: 14 IU/L (ref 0–40)
Albumin/Globulin Ratio: 1.8 (ref 1.2–2.2)
Albumin: 4.6 g/dL (ref 3.8–4.8)
Alkaline Phosphatase: 90 IU/L (ref 44–121)
BUN/Creatinine Ratio: 13 (ref 9–23)
BUN: 14 mg/dL (ref 6–24)
Bilirubin Total: <0.2 mg/dL (ref 0.0–1.2)
CO2: 29 mmol/L (ref 20–29)
Calcium: 10 mg/dL (ref 8.7–10.2)
Chloride: 100 mmol/L (ref 96–106)
Creatinine, Ser: 1.1 mg/dL — ABNORMAL HIGH (ref 0.57–1.00)
Globulin, Total: 2.5 g/dL (ref 1.5–4.5)
Glucose: 96 mg/dL (ref 65–99)
Potassium: 4.3 mmol/L (ref 3.5–5.2)
Sodium: 142 mmol/L (ref 134–144)
Total Protein: 7.1 g/dL (ref 6.0–8.5)
eGFR: 62 mL/min/{1.73_m2} (ref >59)

## 2021-01-06 LAB — HEMOGLOBIN A1C
Est. average glucose Bld gHb Est-mCnc: 154 mg/dL
Hgb A1c MFr Bld: 7 % — ABNORMAL HIGH (ref 4.8–5.6)

## 2021-01-06 LAB — LIPID PANEL
Chol/HDL Ratio: 5.1 ratio — ABNORMAL HIGH (ref 0.0–4.4)
Cholesterol, Total: 167 mg/dL (ref 100–199)
HDL: 33 mg/dL — ABNORMAL LOW (ref >39)
LDL Chol Calc (NIH): 90 mg/dL (ref 0–99)
Triglycerides: 260 mg/dL — ABNORMAL HIGH (ref 0–149)
VLDL Cholesterol Cal: 44 mg/dL — ABNORMAL HIGH (ref 5–40)

## 2021-01-07 ENCOUNTER — Inpatient Hospital Stay: Payer: 59 | Attending: Oncology

## 2021-01-07 ENCOUNTER — Other Ambulatory Visit: Payer: Self-pay

## 2021-01-07 DIAGNOSIS — G4700 Insomnia, unspecified: Secondary | ICD-10-CM | POA: Diagnosis not present

## 2021-01-07 DIAGNOSIS — E559 Vitamin D deficiency, unspecified: Secondary | ICD-10-CM | POA: Diagnosis not present

## 2021-01-07 DIAGNOSIS — D500 Iron deficiency anemia secondary to blood loss (chronic): Secondary | ICD-10-CM | POA: Diagnosis present

## 2021-01-07 DIAGNOSIS — M2550 Pain in unspecified joint: Secondary | ICD-10-CM | POA: Insufficient documentation

## 2021-01-07 DIAGNOSIS — J449 Chronic obstructive pulmonary disease, unspecified: Secondary | ICD-10-CM | POA: Diagnosis not present

## 2021-01-07 DIAGNOSIS — D751 Secondary polycythemia: Secondary | ICD-10-CM | POA: Diagnosis not present

## 2021-01-07 DIAGNOSIS — Z9181 History of falling: Secondary | ICD-10-CM | POA: Diagnosis not present

## 2021-01-07 DIAGNOSIS — E785 Hyperlipidemia, unspecified: Secondary | ICD-10-CM | POA: Diagnosis not present

## 2021-01-07 DIAGNOSIS — R531 Weakness: Secondary | ICD-10-CM | POA: Diagnosis not present

## 2021-01-07 DIAGNOSIS — M545 Low back pain, unspecified: Secondary | ICD-10-CM | POA: Insufficient documentation

## 2021-01-07 DIAGNOSIS — F17210 Nicotine dependence, cigarettes, uncomplicated: Secondary | ICD-10-CM | POA: Insufficient documentation

## 2021-01-07 DIAGNOSIS — Z79899 Other long term (current) drug therapy: Secondary | ICD-10-CM | POA: Insufficient documentation

## 2021-01-07 DIAGNOSIS — R5383 Other fatigue: Secondary | ICD-10-CM | POA: Insufficient documentation

## 2021-01-07 DIAGNOSIS — D509 Iron deficiency anemia, unspecified: Secondary | ICD-10-CM

## 2021-01-07 LAB — CBC WITH DIFFERENTIAL/PLATELET
Abs Immature Granulocytes: 0.39 10*3/uL — ABNORMAL HIGH (ref 0.00–0.07)
Basophils Absolute: 0.1 10*3/uL (ref 0.0–0.1)
Basophils Relative: 1 %
Eosinophils Absolute: 0 10*3/uL (ref 0.0–0.5)
Eosinophils Relative: 0 %
HCT: 38.2 % (ref 36.0–46.0)
Hemoglobin: 12.1 g/dL (ref 12.0–15.0)
Immature Granulocytes: 3 %
Lymphocytes Relative: 14 %
Lymphs Abs: 2.1 10*3/uL (ref 0.7–4.0)
MCH: 24.6 pg — ABNORMAL LOW (ref 26.0–34.0)
MCHC: 31.7 g/dL (ref 30.0–36.0)
MCV: 77.8 fL — ABNORMAL LOW (ref 80.0–100.0)
Monocytes Absolute: 0.9 10*3/uL (ref 0.1–1.0)
Monocytes Relative: 6 %
Neutro Abs: 11.4 10*3/uL — ABNORMAL HIGH (ref 1.7–7.7)
Neutrophils Relative %: 76 %
Platelets: 615 10*3/uL — ABNORMAL HIGH (ref 150–400)
RBC: 4.91 MIL/uL (ref 3.87–5.11)
RDW: 16.5 % — ABNORMAL HIGH (ref 11.5–15.5)
WBC: 14.8 10*3/uL — ABNORMAL HIGH (ref 4.0–10.5)
nRBC: 0 % (ref 0.0–0.2)

## 2021-01-07 LAB — IRON AND TIBC
Iron: 37 ug/dL (ref 28–170)
Saturation Ratios: 7 % — ABNORMAL LOW (ref 10.4–31.8)
TIBC: 553 ug/dL — ABNORMAL HIGH (ref 250–450)
UIBC: 516 ug/dL

## 2021-01-07 LAB — FERRITIN: Ferritin: 7 ng/mL — ABNORMAL LOW (ref 11–307)

## 2021-01-08 ENCOUNTER — Ambulatory Visit: Payer: 59 | Admitting: Oncology

## 2021-01-08 ENCOUNTER — Ambulatory Visit: Payer: 59

## 2021-01-12 ENCOUNTER — Inpatient Hospital Stay: Payer: 59

## 2021-01-12 ENCOUNTER — Inpatient Hospital Stay (HOSPITAL_BASED_OUTPATIENT_CLINIC_OR_DEPARTMENT_OTHER): Payer: 59 | Admitting: Oncology

## 2021-01-12 VITALS — BP 137/82 | HR 68 | Temp 98.0°F | Resp 18 | Wt 242.0 lb

## 2021-01-12 DIAGNOSIS — D509 Iron deficiency anemia, unspecified: Secondary | ICD-10-CM

## 2021-01-12 DIAGNOSIS — D500 Iron deficiency anemia secondary to blood loss (chronic): Secondary | ICD-10-CM | POA: Diagnosis not present

## 2021-01-12 MED ORDER — IRON SUCROSE 20 MG/ML IV SOLN
200.0000 mg | Freq: Once | INTRAVENOUS | Status: AC
  Administered 2021-01-12: 200 mg via INTRAVENOUS
  Filled 2021-01-12: qty 10

## 2021-01-12 MED ORDER — SODIUM CHLORIDE 0.9 % IV SOLN
INTRAVENOUS | Status: DC
  Filled 2021-01-12: qty 250

## 2021-01-12 NOTE — Patient Instructions (Signed)

## 2021-01-12 NOTE — Progress Notes (Signed)
Patient reports she is getting over an upper respiratory virus. She is on steroids for back pain. She was just diagnosed with pre-diabetes.

## 2021-01-12 NOTE — Progress Notes (Signed)
[place]  [phone] [phone]   ID: Erin Zamora OB: 05-12-1970  MR#: [id number]  [id number]  Patient Care Team: Azzie Glatter, FNP (Inactive) as PCP - General (Family Medicine) Lloyd Huger, MD as Consulting Physician (Oncology)   CHIEF COMPLAINT: Thrombocytosis, iron deficiency anemia.  INTERVAL HISTORY: Erin Zamora is a 50 year old female with past medical history significant for COPD chronic left-sided low back pain with sciatica anxiety, obesity who is followed by Erin Zamora for thrombocytosis and iron deficiency anemia.  She last received IV Venofer on 12/12/2019.  In the interim, she has been seen in the emergency room for a fall (07/16/2020), right thumb injury (12/24/2020) and back pain (01/04/2021).   She reports back today to review her recent lab work. She denies any episodes of bleeding.  She will be having a colonoscopy in the next few months.  Reports low energy and daytime sleepiness.  She has chronic low back pain that is stable.   REVIEW OF SYSTEMS:   Review of Systems  Constitutional:  Positive for malaise/fatigue.  Musculoskeletal:  Positive for falls and joint pain.  All other systems reviewed and are negative.  As per HPI. Otherwise, a complete review of systems is negative.  PAST MEDICAL HISTORY: Past Medical History:  Diagnosis Date   Allergy    seasonal   Anemia    Anxiety    CAP (community acquired pneumonia) 10/2018   Cough 10/2018   Hyperlipidemia 10/2018   Hypokalemia    Insomnia 10/2018   Microcytic anemia    Skin lesions 10/2019   Thrombocytosis    Vitamin D deficiency 10/2018    PAST SURGICAL HISTORY: Past Surgical History:  Procedure Laterality Date   APPENDECTOMY  1991   CHOLECYSTECTOMY  2003   TUBAL LIGATION  2003    FAMILY HISTORY: Family History  Problem Relation Age of Onset   Alcohol abuse Mother    COPD Mother    Hypertension Mother    Alcohol abuse  Father    Diabetes Father    Drug abuse Father    Heart disease Father    Hypertension Father    Kidney disease Father    Arthritis Maternal Grandmother    Arthritis Maternal Grandfather    Melanoma Maternal Grandfather    Cancer Paternal Grandmother    Diabetes Paternal Grandmother    Heart disease Paternal Grandmother    Diabetes Paternal Grandfather    Heart disease Paternal Grandfather    Kidney disease Paternal Grandfather    Stroke Paternal Grandfather     ADVANCED DIRECTIVES (Y/N):  N  HEALTH MAINTENANCE: Social History   Tobacco Use   Smoking status: Every Day    Packs/day: 0.50    Types: Cigarettes    Last attempt to quit: 10/01/2018    Years since quitting: 2.2   Smokeless tobacco: Never   Tobacco comments:    5-7 ciggs per day  Vaping Use   Vaping Use: Never used  Substance Use Topics   Alcohol use: Yes    Alcohol/week: 0.0 standard drinks    Comment: rare   Drug use: No     Colonoscopy:  PAP:  Bone density:  Lipid panel:  Allergies  Allergen Reactions   Codeine Itching and Nausea And Vomiting   Iodine     Unknown  Patient states that her mother had seizure reaction to Iodine.     Current Outpatient Medications  Medication Sig Dispense Refill   gabapentin (NEURONTIN) 300 MG  capsule Take 1 capsule (300 mg total) by mouth 3 (three) times daily. 90 capsule 3   lidocaine (LIDODERM) 5 % Place 1 patch onto the skin daily as needed. Remove & Discard patch within 12 hours or as directed by MD 15 patch 0   potassium chloride SA (KLOR-CON) 20 MEQ tablet Take 1 tablet (20 mEq total) by mouth daily. 30 tablet 1   predniSONE (DELTASONE) 20 MG tablet Take 20 mg by mouth 2 (two) times daily.     sertraline (ZOLOFT) 100 MG tablet Take 1 tablet (100 mg total) by mouth daily. 30 tablet 3   Vitamin D, Ergocalciferol, (DRISDOL) 1.25 MG (50000 UNIT) CAPS capsule Take 1 capsule (50,000 Units total) by mouth every 7 (seven) days. 5 capsule 6   acetaminophen (TYLENOL)  325 MG tablet Take 2 tablets (650 mg total) by mouth every 6 (six) hours as needed. (Patient not taking: Reported on 01/12/2021) 36 tablet 0   No current facility-administered medications for this visit.   Facility-Administered Medications Ordered in Other Visits  Medication Dose Route Frequency Provider Last Rate Last Admin   0.9 %  sodium chloride infusion   Intravenous Continuous Jacquelin Hawking, NP   Stopped at 01/12/21 1408    OBJECTIVE: Vitals:   01/12/21 1315  BP: 137/82  Pulse: 68  Resp: 18  Temp: 98 F (36.7 C)  SpO2: 98%     Body mass index is 40.27 kg/m.    ECOG FS:0 - Asymptomatic  Physical Exam Constitutional:      Appearance: Normal appearance.  HENT:     Head: Normocephalic and atraumatic.  Eyes:     Pupils: Pupils are equal, round, and reactive to light.  Cardiovascular:     Rate and Rhythm: Normal rate and regular rhythm.     Heart sounds: Normal heart sounds. No murmur heard. Pulmonary:     Effort: Pulmonary effort is normal.     Breath sounds: Normal breath sounds. No wheezing.  Abdominal:     General: Bowel sounds are normal. There is no distension.     Palpations: Abdomen is soft.     Tenderness: There is no abdominal tenderness.  Musculoskeletal:        General: Normal range of motion.     Cervical back: Normal range of motion.  Skin:    General: Skin is warm and dry.     Findings: No rash.  Neurological:     Mental Status: She is alert and oriented to person, place, and time.  Psychiatric:        Judgment: Judgment normal.    LAB RESULTS:  Lab Results  Component Value Date   NA 142 01/05/2021   K 4.3 01/05/2021   CL 100 01/05/2021   CO2 29 01/05/2021   GLUCOSE 96 01/05/2021   BUN 14 01/05/2021   CREATININE 1.10 (H) 01/05/2021   CALCIUM 10.0 01/05/2021   PROT 7.1 01/05/2021   ALBUMIN 4.6 01/05/2021   AST 14 01/05/2021   ALT 14 01/05/2021   ALKPHOS 90 01/05/2021   BILITOT <0.2 01/05/2021   GFRNONAA >60 07/16/2020   GFRAA >60  10/15/2018    Lab Results  Component Value Date   WBC 14.8 (H) 01/07/2021   NEUTROABS 11.4 (H) 01/07/2021   HGB 12.1 01/07/2021   HCT 38.2 01/07/2021   MCV 77.8 (L) 01/07/2021   PLT 615 (H) 01/07/2021   Lab Results  Component Value Date   IRON 37 01/07/2021   TIBC 553 (H) 01/07/2021  IRONPCTSAT 7 (L) 01/07/2021   Lab Results  Component Value Date   FERRITIN 7 (L) 01/07/2021     STUDIES: DG Hand Complete Right  Result Date: 12/24/2020 CLINICAL DATA:  Fall 2 weeks ago with right hand pain, initial encounter EXAM: RIGHT HAND - COMPLETE 3+ VIEW COMPARISON:  None. FINDINGS: There is no evidence of fracture or dislocation. There is no evidence of arthropathy or other focal bone abnormality. Soft tissues are unremarkable. IMPRESSION: No acute abnormality noted. Electronically Signed   By: Inez Catalina M.D.   On: 12/24/2020 19:09    ASSESSMENT: Thrombocytosis, iron deficiency anemia.  PLAN:  1.  Iron deficiency anemia:  Unclear etiology of her iron deficiency anemia.  She has not had a work-up with colonoscopy/EGD.  She was referred recently by her PCP and will be having a colonoscopy in the next 3 months or so.  Denies any bleeding.  Labs from 01/07/2021 show a ferritin of 7 with an iron saturation of 7%.  Hemoglobin is 12.1 and MCV is 77.8.  Recommend 5 doses of IV Venofer given over the next 2 weeks.  Return to clinic in 3 to 4 months with repeat lab work, assessment and possible iron.  2. Thrombocytosis:  Secondary to IDA.  Previous work-up revealed a negative JAK2.   Disposition- Proceed with 5 doses of IV Venofer over the next 2 weeks.  RTC in 3 to 4 months for repeat lab work, assessment and possible iron.  I spent 15 minutes dedicated to the care of this patient (face-to-face and non-face-to-face) on the date of the encounter to include what is described in the assessment and plan.  Patient expressed understanding that she can return to clinic at any time if she has any  questions, concerns, or complaints.   Jacquelin Hawking, NP 01/12/2021 3:59 PM

## 2021-01-18 ENCOUNTER — Inpatient Hospital Stay: Payer: 59

## 2021-01-18 ENCOUNTER — Other Ambulatory Visit: Payer: Self-pay

## 2021-01-18 VITALS — BP 130/76 | HR 69 | Temp 98.2°F | Resp 18

## 2021-01-18 DIAGNOSIS — D500 Iron deficiency anemia secondary to blood loss (chronic): Secondary | ICD-10-CM | POA: Diagnosis not present

## 2021-01-18 DIAGNOSIS — D509 Iron deficiency anemia, unspecified: Secondary | ICD-10-CM

## 2021-01-18 MED ORDER — IRON SUCROSE 20 MG/ML IV SOLN
200.0000 mg | Freq: Once | INTRAVENOUS | Status: AC
  Administered 2021-01-18: 200 mg via INTRAVENOUS
  Filled 2021-01-18: qty 10

## 2021-01-18 MED ORDER — SODIUM CHLORIDE 0.9 % IV SOLN
INTRAVENOUS | Status: DC
  Filled 2021-01-18: qty 250

## 2021-01-20 ENCOUNTER — Inpatient Hospital Stay: Payer: 59

## 2021-01-26 ENCOUNTER — Inpatient Hospital Stay: Payer: 59

## 2021-01-26 VITALS — BP 153/90 | HR 81 | Temp 97.8°F

## 2021-01-26 DIAGNOSIS — D509 Iron deficiency anemia, unspecified: Secondary | ICD-10-CM

## 2021-01-26 DIAGNOSIS — D500 Iron deficiency anemia secondary to blood loss (chronic): Secondary | ICD-10-CM | POA: Diagnosis not present

## 2021-01-26 MED ORDER — SODIUM CHLORIDE 0.9 % IV SOLN
INTRAVENOUS | Status: DC
  Filled 2021-01-26: qty 250

## 2021-01-26 MED ORDER — IRON SUCROSE 20 MG/ML IV SOLN
200.0000 mg | Freq: Once | INTRAVENOUS | Status: AC
  Administered 2021-01-26: 200 mg via INTRAVENOUS
  Filled 2021-01-26: qty 10

## 2021-02-02 ENCOUNTER — Inpatient Hospital Stay: Payer: 59 | Attending: Oncology

## 2021-02-02 VITALS — BP 144/89 | HR 76 | Temp 98.6°F | Resp 20

## 2021-02-02 DIAGNOSIS — D500 Iron deficiency anemia secondary to blood loss (chronic): Secondary | ICD-10-CM | POA: Insufficient documentation

## 2021-02-02 DIAGNOSIS — D509 Iron deficiency anemia, unspecified: Secondary | ICD-10-CM

## 2021-02-02 DIAGNOSIS — Z79899 Other long term (current) drug therapy: Secondary | ICD-10-CM | POA: Diagnosis not present

## 2021-02-02 MED ORDER — IRON SUCROSE 20 MG/ML IV SOLN
200.0000 mg | Freq: Once | INTRAVENOUS | Status: AC
  Administered 2021-02-02: 200 mg via INTRAVENOUS
  Filled 2021-02-02: qty 10

## 2021-02-02 MED ORDER — SODIUM CHLORIDE 0.9 % IV SOLN
INTRAVENOUS | Status: DC
  Filled 2021-02-02: qty 250

## 2021-02-02 NOTE — Patient Instructions (Signed)

## 2021-02-09 ENCOUNTER — Inpatient Hospital Stay: Payer: 59

## 2021-02-09 ENCOUNTER — Other Ambulatory Visit: Payer: Self-pay

## 2021-02-09 VITALS — BP 124/80 | HR 57 | Temp 97.0°F | Resp 18

## 2021-02-09 DIAGNOSIS — D500 Iron deficiency anemia secondary to blood loss (chronic): Secondary | ICD-10-CM | POA: Diagnosis not present

## 2021-02-09 DIAGNOSIS — D509 Iron deficiency anemia, unspecified: Secondary | ICD-10-CM

## 2021-02-09 MED ORDER — IRON SUCROSE 20 MG/ML IV SOLN
200.0000 mg | Freq: Once | INTRAVENOUS | Status: AC
  Administered 2021-02-09: 200 mg via INTRAVENOUS
  Filled 2021-02-09: qty 10

## 2021-02-09 MED ORDER — SODIUM CHLORIDE 0.9 % IV SOLN
INTRAVENOUS | Status: DC
  Filled 2021-02-09: qty 250

## 2021-02-09 NOTE — Patient Instructions (Signed)

## 2021-02-19 ENCOUNTER — Other Ambulatory Visit: Payer: Self-pay | Admitting: Nurse Practitioner

## 2021-02-19 DIAGNOSIS — G8929 Other chronic pain: Secondary | ICD-10-CM

## 2021-02-19 MED ORDER — PREDNISONE 20 MG PO TABS: 20.0000 mg | ORAL_TABLET | Freq: Two times a day (BID) | ORAL | 0 refills | Status: AC

## 2021-04-06 ENCOUNTER — Other Ambulatory Visit: Payer: Self-pay

## 2021-04-06 ENCOUNTER — Other Ambulatory Visit: Payer: Self-pay | Admitting: Nurse Practitioner

## 2021-04-06 ENCOUNTER — Encounter: Payer: Self-pay | Admitting: Nurse Practitioner

## 2021-04-06 ENCOUNTER — Ambulatory Visit (INDEPENDENT_AMBULATORY_CARE_PROVIDER_SITE_OTHER): Payer: 59 | Admitting: Nurse Practitioner

## 2021-04-06 VITALS — BP 149/99 | HR 79 | Temp 98.2°F | Ht 65.0 in | Wt 235.0 lb

## 2021-04-06 DIAGNOSIS — N6310 Unspecified lump in the right breast, unspecified quadrant: Secondary | ICD-10-CM | POA: Diagnosis not present

## 2021-04-06 DIAGNOSIS — N39498 Other specified urinary incontinence: Secondary | ICD-10-CM

## 2021-04-06 DIAGNOSIS — R030 Elevated blood-pressure reading, without diagnosis of hypertension: Secondary | ICD-10-CM

## 2021-04-06 DIAGNOSIS — Z01419 Encounter for gynecological examination (general) (routine) without abnormal findings: Secondary | ICD-10-CM

## 2021-04-06 DIAGNOSIS — Z0000 Encounter for general adult medical examination without abnormal findings: Secondary | ICD-10-CM | POA: Diagnosis not present

## 2021-04-06 LAB — POCT URINALYSIS DIP (CLINITEK)
Bilirubin, UA: NEGATIVE
Blood, UA: NEGATIVE
Glucose, UA: NEGATIVE mg/dL
Ketones, POC UA: NEGATIVE mg/dL
Leukocytes, UA: NEGATIVE
Nitrite, UA: NEGATIVE
POC PROTEIN,UA: NEGATIVE
Spec Grav, UA: 1.02 (ref 1.010–1.025)
Urobilinogen, UA: 0.2 E.U./dL
pH, UA: 7 (ref 5.0–8.0)

## 2021-04-06 LAB — POCT GLYCOSYLATED HEMOGLOBIN (HGB A1C)
HbA1c POC (<> result, manual entry): 6.2 % (ref 4.0–5.6)
HbA1c, POC (controlled diabetic range): 6.2 % (ref 0.0–7.0)
HbA1c, POC (prediabetic range): 6.2 % (ref 5.7–6.4)
Hemoglobin A1C: 6.2 % — AB (ref 4.0–5.6)

## 2021-04-06 NOTE — Progress Notes (Signed)
[place], [place]  [address] Phone:  [phone]   Fax:  [phone] Subjective:   Patient ID: Erin Zamora, female    DOB: 1970-09-11, 50 y.o.   MRN: [id number]  Chief Complaint  Patient presents with   Gynecologic Exam    PAP    [name] 50 y.o. female  has a past medical history of Allergy, Anemia, Anxiety, CAP (community acquired pneumonia) (10/2018), Cough (10/2018), Hyperlipidemia (10/2018), Hypokalemia, Insomnia (10/2018), Microcytic anemia, Skin lesions (10/2019), Thrombocytosis, and Vitamin D deficiency (10/2018).  To the [facility] for women's annual wellness visit.   Patient endorses having mild intermittent vaginal itching since completing antibiotics one 1 wk ago. Was prescribed Augmentin for sinusitis, which has now resolved. Denies any vaginal odor or discharge. Suspects that she may have a yeast infection, but is uncertain. Also verbalizes having intermittent urinary incontinence, most noticeable with sneezing, coughing or laughing. Sometimes urinary leakage occurs without any aggravating factors. Patient states that she wears padded underwear daily. Denies any abdominal pain or increased urgency. Denies any burning with urination or dysuria. Has not been evaluated for symptoms, recently began over the past few months.   Patient has had one sexual partner, unprotected in the past 6 mths. States that she has been with her female partner for 21 yrs.   Concerned about right breast mass x 1 wk. Completes self breast exams regularly. Paternal grandmother had metastatic breast cancer. Denies any nipple drainage. Endorses pain to area only with deep palpation.   Patient denies any other complaints today. States that she has been compliant with all medications and monitoring meals. Endorses a weight loss of 10 lbs. Denies any fever. Denies any fatigue, chest pain, shortness of breath, HA or dizziness. Denies any blurred  vision, numbness or tingling.   Past Medical History:  Diagnosis Date   Allergy    seasonal   Anemia    Anxiety    CAP (community acquired pneumonia) 10/2018   Cough 10/2018   Hyperlipidemia 10/2018   Hypokalemia    Insomnia 10/2018   Microcytic anemia    Skin lesions 10/2019   Thrombocytosis    Vitamin D deficiency 10/2018    Past Surgical History:  Procedure Laterality Date   APPENDECTOMY  1991   CHOLECYSTECTOMY  2003   TUBAL LIGATION  2003    Family History  Problem Relation Age of Onset   Alcohol abuse Mother    COPD Mother    Hypertension Mother    Alcohol abuse Father    Diabetes Father    Drug abuse Father    Heart disease Father    Hypertension Father    Kidney disease Father    Arthritis Maternal Grandmother    Arthritis Maternal Grandfather    Melanoma Maternal Grandfather    Cancer Paternal Grandmother    Diabetes Paternal Grandmother    Heart disease Paternal Grandmother    Diabetes Paternal Grandfather    Heart disease Paternal Grandfather    Kidney disease Paternal Grandfather    Stroke Paternal Grandfather     Social History   Socioeconomic History   Marital status: Single    Spouse name: Not on file   Number of children: Not on file   Years of education: Not on file   Highest education level: Not on file  Occupational History   Not on file  Tobacco Use   Smoking status: Every Day    Packs/day: 0.50  Types: Cigarettes    Last attempt to quit: 10/01/2018    Years since quitting: 2.5   Smokeless tobacco: Never   Tobacco comments:    5-7 ciggs per day  Vaping Use   Vaping Use: Never used  Substance and Sexual Activity   Alcohol use: Yes    Alcohol/week: 0.0 standard drinks    Comment: rare   Drug use: No   Sexual activity: Yes    Partners: Male    Birth control/protection: None  Other Topics Concern   Not on file  Social History Narrative   49 year old and 77 y/o daughters   15 year old granddaughter - takes care of her in  the afternoon after school   Significant other - 15 years   Work: Web designer   Social Determinants of Radio broadcast assistant Strain: Not on Art therapist Insecurity: Not on file  Transportation Needs: Not on file  Physical Activity: Not on file  Stress: Not on file  Social Connections: Not on file  Intimate Partner Violence: Not on file    Outpatient Medications Prior to Visit  Medication Sig Dispense Refill   acetaminophen (TYLENOL) 325 MG tablet Take 2 tablets (650 mg total) by mouth every 6 (six) hours as needed. 36 tablet 0   gabapentin (NEURONTIN) 300 MG capsule Take 1 capsule (300 mg total) by mouth 3 (three) times daily. 90 capsule 3   potassium chloride SA (KLOR-CON) 20 MEQ tablet Take 1 tablet (20 mEq total) by mouth daily. 30 tablet 1   sertraline (ZOLOFT) 100 MG tablet Take 1 tablet (100 mg total) by mouth daily. 30 tablet 3   Vitamin D, Ergocalciferol, (DRISDOL) 1.25 MG (50000 UNIT) CAPS capsule Take 1 capsule (50,000 Units total) by mouth every 7 (seven) days. 5 capsule 6   lidocaine (LIDODERM) 5 % Place 1 patch onto the skin daily as needed. Remove & Discard patch within 12 hours or as directed by MD (Patient not taking: Reported on 04/06/2021) 15 patch 0   No facility-administered medications prior to visit.    Allergies  Allergen Reactions   Codeine Itching and Nausea And Vomiting   Iodine     Unknown  Patient states that her mother had seizure reaction to Iodine.     Review of Systems  Constitutional:  Negative for chills, fever and malaise/fatigue.  Respiratory:  Negative for cough and shortness of breath.   Cardiovascular:  Negative for chest pain, palpitations and leg swelling.  Gastrointestinal:  Negative for abdominal pain, blood in stool, constipation, diarrhea, nausea and vomiting.  Genitourinary:  Negative for dysuria, flank pain, frequency, hematuria and urgency.       See HPI  Musculoskeletal: Negative.   Skin: Negative.    Psychiatric/Behavioral:  Negative for depression. The patient is not nervous/anxious.   All other systems reviewed and are negative.     Objective:    Physical Exam Vitals reviewed. Exam conducted with a chaperone present.  Constitutional:      General: She is not in acute distress.    Appearance: Normal appearance. She is obese.  HENT:     Head: Normocephalic.  Cardiovascular:     Rate and Rhythm: Normal rate and regular rhythm.     Pulses: Normal pulses.     Heart sounds: Normal heart sounds.     Comments: No obvious peripheral edema Pulmonary:     Effort: Pulmonary effort is normal.     Breath sounds: Normal breath sounds.  Chest:  Breasts:    Right: Mass and tenderness present. No swelling, bleeding, inverted nipple, nipple discharge or skin change.     Left: Normal.       Comments: 1- 1.5 cm superficial elongated superficial mass noted to right breast  Genitourinary:    General: Normal vulva.     Pubic Area: No rash.      Labia:        Right: No rash, tenderness, lesion or injury.        Left: No rash, tenderness, lesion or injury.      Urethra: No prolapse, urethral pain, urethral swelling or urethral lesion.     Vagina: No signs of injury and foreign body. Vaginal discharge present. No erythema, tenderness, bleeding, lesions or prolapsed vaginal walls.     Cervix: Normal.     Uterus: Normal.      Comments: Small to moderate amount of thick white vaginal discharge noted  Lymphadenopathy:     Upper Body:     Right upper body: No supraclavicular, axillary or pectoral adenopathy.     Left upper body: No supraclavicular, axillary or pectoral adenopathy.  Skin:    General: Skin is warm and dry.     Capillary Refill: Capillary refill takes less than 2 seconds.  Neurological:     General: No focal deficit present.     Mental Status: She is alert and oriented to person, place, and time.  Psychiatric:        Mood and Affect: Mood normal.        Behavior: Behavior  normal.        Thought Content: Thought content normal.        Judgment: Judgment normal.    BP (!) 149/99   Pulse 79   Temp 98.2 F (36.8 C)   Ht '5\' 5"'  (1.651 m)   Wt 235 lb 0.4 oz (106.6 kg)   LMP 02/17/2021   SpO2 94%   BMI 39.11 kg/m  Wt Readings from Last 3 Encounters:  04/06/21 235 lb 0.4 oz (106.6 kg)  01/12/21 242 lb (109.8 kg)  01/05/21 245 lb 9.6 oz (111.4 kg)     There is no immunization history on file for this patient.  Diabetic Foot Exam - Simple   No data filed     Lab Results  Component Value Date   TSH 0.961 10/14/2018   Lab Results  Component Value Date   WBC 14.8 (H) 01/07/2021   HGB 12.1 01/07/2021   HCT 38.2 01/07/2021   MCV 77.8 (L) 01/07/2021   PLT 615 (H) 01/07/2021   Lab Results  Component Value Date   NA 142 01/05/2021   K 4.3 01/05/2021   CO2 29 01/05/2021   GLUCOSE 96 01/05/2021   BUN 14 01/05/2021   CREATININE 1.10 (H) 01/05/2021   BILITOT <0.2 01/05/2021   ALKPHOS 90 01/05/2021   AST 14 01/05/2021   ALT 14 01/05/2021   PROT 7.1 01/05/2021   ALBUMIN 4.6 01/05/2021   CALCIUM 10.0 01/05/2021   ANIONGAP 10 07/16/2020   EGFR 62 01/05/2021   Lab Results  Component Value Date   CHOL 167 01/05/2021   CHOL 168 10/23/2018   Lab Results  Component Value Date   HDL 33 (L) 01/05/2021   HDL 42 10/23/2018   Lab Results  Component Value Date   LDLCALC 90 01/05/2021   [place] 80 10/23/2018   Lab Results  Component Value Date   TRIG 260 (H) 01/05/2021  TRIG 228 (H) 10/23/2018   Lab Results  Component Value Date   CHOLHDL 5.1 (H) 01/05/2021   CHOLHDL 4.0 10/23/2018   Lab Results  Component Value Date   HGBA1C 6.2 (A) 04/06/2021   HGBA1C 6.2 04/06/2021   HGBA1C 6.2 04/06/2021   HGBA1C 6.2 04/06/2021       Assessment & Plan:   Problem List Items Addressed This Visit   None Visit Diagnoses     Women's annual routine gynecological examination    -  Primary   Relevant Orders   Pap IG and Chlamydia/Gonococcus,  NAA ([facility])   NuSwab Vaginitis Plus (VG+)   HIV antibody (with reflex)   POCT URINALYSIS DIP (CLINITEK) (Completed) Encouraged continued self breast exams   Healthcare maintenance       Relevant Orders   POCT glycosylated hemoglobin (Hb A1C) (Completed): at goal at 6.2   Mass of right breast, unspecified quadrant     Diagnostic mammogram ordered   Other urinary incontinence       Relevant Orders   Ambulatory referral to Urogynecology   Elevated BP without diagnosis of hypertension     Will reevaluate at follow up Patient encouraged to check B/P at home periodically  Encouraged continued diet and exercise efforts  Encouraged continued compliance with medication         I am having Lovinia L. Sharlett Iles maintain her acetaminophen, lidocaine, sertraline, gabapentin, potassium chloride SA, and Vitamin D (Ergocalciferol).  No orders of the defined types were placed in this encounter.    Teena Dunk, NP

## 2021-04-06 NOTE — Patient Instructions (Signed)
You were seen today in the [facility] for women's annual wellness visit. Labs were collected, results will be available via MyChart or, if abnormal, you will be contacted by clinic staff.  Please follow up in 6 mths for reevaluation.

## 2021-04-09 ENCOUNTER — Other Ambulatory Visit: Payer: Self-pay | Admitting: Nurse Practitioner

## 2021-04-09 DIAGNOSIS — B9689 Other specified bacterial agents as the cause of diseases classified elsewhere: Secondary | ICD-10-CM

## 2021-04-09 LAB — PAP IG AND CT-NG NAA
Chlamydia, Nuc. Acid Amp: NEGATIVE
Gonococcus by Nucleic Acid Amp: NEGATIVE

## 2021-04-09 LAB — NUSWAB VAGINITIS PLUS (VG+)
Atopobium vaginae: HIGH Score — AB
BVAB 2: HIGH Score — AB
Candida albicans, NAA: NEGATIVE
Candida glabrata, NAA: NEGATIVE
Chlamydia trachomatis, NAA: NEGATIVE
Megasphaera 1: HIGH Score — AB
Neisseria gonorrhoeae, NAA: NEGATIVE
Trich vag by NAA: NEGATIVE

## 2021-04-09 MED ORDER — METRONIDAZOLE 500 MG PO TABS: 500.0000 mg | ORAL_TABLET | Freq: Two times a day (BID) | ORAL | 0 refills | Status: AC

## 2021-04-12 ENCOUNTER — Inpatient Hospital Stay: Payer: 59 | Attending: Oncology

## 2021-04-12 ENCOUNTER — Other Ambulatory Visit: Payer: Self-pay

## 2021-04-12 DIAGNOSIS — D509 Iron deficiency anemia, unspecified: Secondary | ICD-10-CM | POA: Diagnosis not present

## 2021-04-12 DIAGNOSIS — F419 Anxiety disorder, unspecified: Secondary | ICD-10-CM | POA: Diagnosis not present

## 2021-04-12 DIAGNOSIS — M545 Low back pain, unspecified: Secondary | ICD-10-CM | POA: Insufficient documentation

## 2021-04-12 DIAGNOSIS — F17210 Nicotine dependence, cigarettes, uncomplicated: Secondary | ICD-10-CM | POA: Diagnosis not present

## 2021-04-12 DIAGNOSIS — Z809 Family history of malignant neoplasm, unspecified: Secondary | ICD-10-CM | POA: Insufficient documentation

## 2021-04-12 DIAGNOSIS — E669 Obesity, unspecified: Secondary | ICD-10-CM | POA: Diagnosis not present

## 2021-04-12 DIAGNOSIS — E876 Hypokalemia: Secondary | ICD-10-CM | POA: Insufficient documentation

## 2021-04-12 DIAGNOSIS — E59 Dietary selenium deficiency: Secondary | ICD-10-CM | POA: Diagnosis not present

## 2021-04-12 DIAGNOSIS — Z79899 Other long term (current) drug therapy: Secondary | ICD-10-CM | POA: Insufficient documentation

## 2021-04-12 DIAGNOSIS — J449 Chronic obstructive pulmonary disease, unspecified: Secondary | ICD-10-CM | POA: Insufficient documentation

## 2021-04-12 DIAGNOSIS — E785 Hyperlipidemia, unspecified: Secondary | ICD-10-CM | POA: Insufficient documentation

## 2021-04-12 DIAGNOSIS — G4700 Insomnia, unspecified: Secondary | ICD-10-CM | POA: Insufficient documentation

## 2021-04-12 DIAGNOSIS — D751 Secondary polycythemia: Secondary | ICD-10-CM | POA: Insufficient documentation

## 2021-04-12 LAB — CBC WITH DIFFERENTIAL/PLATELET
Abs Immature Granulocytes: 0.07 10*3/uL (ref 0.00–0.07)
Basophils Absolute: 0.1 10*3/uL (ref 0.0–0.1)
Basophils Relative: 1 %
Eosinophils Absolute: 0.3 10*3/uL (ref 0.0–0.5)
Eosinophils Relative: 2 %
HCT: 44.9 % (ref 36.0–46.0)
Hemoglobin: 14.7 g/dL (ref 12.0–15.0)
Immature Granulocytes: 1 %
Lymphocytes Relative: 25 %
Lymphs Abs: 2.6 10*3/uL (ref 0.7–4.0)
MCH: 28.4 pg (ref 26.0–34.0)
MCHC: 32.7 g/dL (ref 30.0–36.0)
MCV: 86.8 fL (ref 80.0–100.0)
Monocytes Absolute: 0.8 10*3/uL (ref 0.1–1.0)
Monocytes Relative: 8 %
Neutro Abs: 6.6 10*3/uL (ref 1.7–7.7)
Neutrophils Relative %: 63 %
Platelets: 480 10*3/uL — ABNORMAL HIGH (ref 150–400)
RBC: 5.17 MIL/uL — ABNORMAL HIGH (ref 3.87–5.11)
RDW: 16.5 % — ABNORMAL HIGH (ref 11.5–15.5)
WBC: 10.5 10*3/uL (ref 4.0–10.5)
nRBC: 0 % (ref 0.0–0.2)

## 2021-04-12 LAB — IRON AND TIBC
Iron: 63 ug/dL (ref 28–170)
Saturation Ratios: 14 % (ref 10.4–31.8)
TIBC: 456 ug/dL — ABNORMAL HIGH (ref 250–450)
UIBC: 393 ug/dL

## 2021-04-12 LAB — FERRITIN: Ferritin: 29 ng/mL (ref 11–307)

## 2021-04-13 ENCOUNTER — Encounter: Payer: Self-pay | Admitting: Hospice and Palliative Medicine

## 2021-04-13 ENCOUNTER — Inpatient Hospital Stay: Payer: 59

## 2021-04-13 ENCOUNTER — Inpatient Hospital Stay: Payer: 59 | Admitting: Hospice and Palliative Medicine

## 2021-04-13 VITALS — BP 143/90 | HR 79 | Temp 98.4°F | Resp 18 | Wt 237.4 lb

## 2021-04-13 DIAGNOSIS — Z1211 Encounter for screening for malignant neoplasm of colon: Secondary | ICD-10-CM | POA: Diagnosis not present

## 2021-04-13 DIAGNOSIS — D509 Iron deficiency anemia, unspecified: Secondary | ICD-10-CM | POA: Diagnosis not present

## 2021-04-13 NOTE — Progress Notes (Signed)
[place]  [phone] [phone]   ID: [name] OB: Oct 03, 1970  MR#: [id number]  HGD#:[id number]  Patient Care Team: Teena Dunk, NP as PCP - General (Nurse Practitioner) Lloyd Huger, MD as Consulting Physician (Oncology)   CHIEF COMPLAINT: Thrombocytosis, iron deficiency anemia.  INTERVAL HISTORY: Mrs. [place] is a 50 year old female with past medical history significant for COPD chronic left-sided low back pain with sciatica anxiety, obesity who is followed by Dr. Grayland Ormond for thrombocytosis and iron deficiency anemia.  She last received IV Venofer 200 mg x 5 doses (last on 02/09/2021)  She presents to clinic today to review her recent lab work.  Patient reports that she is doing well.  She denies significant changes or concerns.  She is being followed regularly by her PCP and has recently been referred for mammography/ultrasound.  She also has upcoming appointment with urology due to chronic urinary problems.  She denies any episodes of bleeding.  She has been previously referred to GI for colonoscopy but does not have this scheduled.  She reports stable fatigue, denies shortness of breath.   REVIEW OF SYSTEMS:   Review of Systems  Constitutional:  Positive for malaise/fatigue. Negative for chills, fever and weight loss.  Respiratory:  Negative for shortness of breath.   Cardiovascular:  Negative for chest pain and leg swelling.  Gastrointestinal:  Negative for abdominal pain, blood in stool, constipation, diarrhea, melena, nausea and vomiting.  Genitourinary:  Positive for frequency. Negative for dysuria and urgency.  Musculoskeletal:  Positive for joint pain.  Neurological:  Negative for sensory change.  Endo/Heme/Allergies:  Does not bruise/bleed easily.  All other systems reviewed and are negative.  As per HPI. Otherwise, a complete review of systems is negative.  PAST MEDICAL HISTORY: Past Medical  History:  Diagnosis Date   Allergy    seasonal   Anemia    Anxiety    CAP (community acquired pneumonia) 10/2018   Cough 10/2018   Hyperlipidemia 10/2018   Hypokalemia    Insomnia 10/2018   Microcytic anemia    Skin lesions 10/2019   Thrombocytosis    Vitamin D deficiency 10/2018    PAST SURGICAL HISTORY: Past Surgical History:  Procedure Laterality Date   APPENDECTOMY  1991   CHOLECYSTECTOMY  2003   TUBAL LIGATION  2003    FAMILY HISTORY: Family History  Problem Relation Age of Onset   Alcohol abuse Mother    COPD Mother    Hypertension Mother    Alcohol abuse Father    Diabetes Father    Drug abuse Father    Heart disease Father    Hypertension Father    Kidney disease Father    Arthritis Maternal Grandmother    Arthritis Maternal Grandfather    Melanoma Maternal Grandfather    Cancer Paternal Grandmother    Diabetes Paternal Grandmother    Heart disease Paternal Grandmother    Diabetes Paternal Grandfather    Heart disease Paternal Grandfather    Kidney disease Paternal Grandfather    Stroke Paternal Grandfather     ADVANCED DIRECTIVES (Y/N):  N  HEALTH MAINTENANCE: Social History   Tobacco Use   Smoking status: Every Day    Packs/day: 0.50    Types: Cigarettes    Last attempt to quit: 10/01/2018    Years since quitting: 2.5   Smokeless tobacco: Never   Tobacco comments:    5-7 ciggs per day  Vaping Use   Vaping Use: Never used  Substance  Use Topics   Alcohol use: Yes    Alcohol/week: 0.0 standard drinks    Comment: rare   Drug use: No     Colonoscopy:  PAP:  Bone density:  Lipid panel:  Allergies  Allergen Reactions   Codeine Itching and Nausea And Vomiting   Iodine     Unknown  Patient states that her mother had seizure reaction to Iodine.     Current Outpatient Medications  Medication Sig Dispense Refill   acetaminophen (TYLENOL) 325 MG tablet Take 2 tablets (650 mg total) by mouth every 6 (six) hours as needed. 36 tablet 0    gabapentin (NEURONTIN) 300 MG capsule Take 1 capsule (300 mg total) by mouth 3 (three) times daily. 90 capsule 3   metroNIDAZOLE (FLAGYL) 500 MG tablet Take 1 tablet (500 mg total) by mouth 2 (two) times daily for 7 days. 14 tablet 0   potassium chloride SA (KLOR-CON) 20 MEQ tablet Take 1 tablet (20 mEq total) by mouth daily. 30 tablet 1   sertraline (ZOLOFT) 100 MG tablet Take 1 tablet (100 mg total) by mouth daily. 30 tablet 3   Vitamin D, Ergocalciferol, (DRISDOL) 1.25 MG (50000 UNIT) CAPS capsule Take 1 capsule (50,000 Units total) by mouth every 7 (seven) days. 5 capsule 6   No current facility-administered medications for this visit.    OBJECTIVE: There were no vitals filed for this visit.    There is no height or weight on file to calculate BMI.    ECOG FS:0 - Asymptomatic  Physical Exam Constitutional:      Appearance: Normal appearance.  HENT:     Head: Normocephalic and atraumatic.  Eyes:     Pupils: Pupils are equal, round, and reactive to light.  Cardiovascular:     Rate and Rhythm: Normal rate and regular rhythm.     Heart sounds: Normal heart sounds. No murmur heard. Pulmonary:     Effort: Pulmonary effort is normal. No respiratory distress.     Breath sounds: Normal breath sounds. No wheezing.  Abdominal:     General: Bowel sounds are normal. There is no distension.     Palpations: Abdomen is soft. There is no mass.     Tenderness: There is no abdominal tenderness.  Musculoskeletal:        General: Normal range of motion.     Cervical back: Normal range of motion.  Skin:    General: Skin is warm and dry.     Findings: No rash.  Neurological:     General: No focal deficit present.     Mental Status: She is alert and oriented to person, place, and time.  Psychiatric:        Judgment: Judgment normal.    LAB RESULTS:  Lab Results  Component Value Date   NA 142 01/05/2021   K 4.3 01/05/2021   CL 100 01/05/2021   CO2 29 01/05/2021   GLUCOSE 96 01/05/2021    BUN 14 01/05/2021   CREATININE 1.10 (H) 01/05/2021   CALCIUM 10.0 01/05/2021   PROT 7.1 01/05/2021   ALBUMIN 4.6 01/05/2021   AST 14 01/05/2021   ALT 14 01/05/2021   ALKPHOS 90 01/05/2021   BILITOT <0.2 01/05/2021   GFRNONAA >60 07/16/2020   GFRAA >60 10/15/2018    Lab Results  Component Value Date   WBC 10.5 04/12/2021   NEUTROABS 6.6 04/12/2021   HGB 14.7 04/12/2021   HCT 44.9 04/12/2021   MCV 86.8 04/12/2021   PLT 480 (H)  04/12/2021   Lab Results  Component Value Date   IRON 63 04/12/2021   TIBC 456 (H) 04/12/2021   IRONPCTSAT 14 04/12/2021   Lab Results  Component Value Date   FERRITIN 29 04/12/2021     STUDIES: No results found.  ASSESSMENT: Thrombocytosis, iron deficiency anemia.  PLAN:  1.  Iron deficiency anemia:  Unclear etiology of her iron deficiency anemia.  She has not had a work-up with colonoscopy/EGD.  Patient reports previous referral from PCP to GI but nothing has been scheduled.  We will send a new referral.  Denies any bleeding.  Labs from 04/12/2021 are overall improved with normalized ferritin of 29 with an iron saturation of 14 %.  Hemoglobin is 14.7 and MCV is 86.8.  We will hold on further IV Venofer at this point given resolution of anemia.  Continue monitoring labs and will schedule follow-up labs/MD visit in 6 months  2. Thrombocytosis:  Secondary to IDA.  Previous work-up revealed a negative JAK2.   Disposition-  RTC in 6 months for repeat lab work, assessment and possible iron.    Case and plan discussed with Dr. Grayland Ormond  I spent 15 minutes dedicated to the care of this patient (face-to-face and non-face-to-face) on the date of the encounter to include what is described in the assessment and plan.  Patient expressed understanding that she can return to clinic at any time if she has any questions, concerns, or complaints.   Irean Hong, NP 04/13/2021 1:11 PM

## 2021-04-13 NOTE — Progress Notes (Signed)
Anemia follow up. Always tired. Just finished 5 doses of Venofer. Able to tell when it is time for iron by shortness of breath. Breathing is good today. Appetite is good. Blood is present in urine as she has chronic UTIs. None right now. Has a referral in for urology. [facility].

## 2021-04-15 ENCOUNTER — Other Ambulatory Visit: Payer: Self-pay

## 2021-04-15 DIAGNOSIS — Z1211 Encounter for screening for malignant neoplasm of colon: Secondary | ICD-10-CM

## 2021-04-15 MED ORDER — SUTAB 1479-225-188 MG PO TABS: 12.0000 | ORAL_TABLET | Freq: Once | ORAL | 0 refills | Status: AC

## 2021-04-15 NOTE — Progress Notes (Signed)
Gastroenterology Pre-Procedure Review  Request Date: 05/18/2021 Requesting Physician: Dr. Vicente Males  PATIENT REVIEW QUESTIONS: The patient responded to the following health history questions as indicated:    1. Are you having any GI issues?  Constipation and diarrhea sometimes. 2. Do you have a personal history of Polyps? no 3. Do you have a family history of Colon Cancer or Polyps? yes (P. Grandmother- colon cancer) 4. Diabetes Mellitus?  prediabetic 5. Joint replacements in the past 12 months?no 6. Major health problems in the past 3 months?no 7. Any artificial heart valves, MVP, or defibrillator?no    MEDICATIONS & ALLERGIES:    Patient reports the following regarding taking any anticoagulation/antiplatelet therapy:   Plavix, Coumadin, Eliquis, Xarelto, Lovenox, Pradaxa, Brilinta, or Effient? no Aspirin? no  Patient confirms/reports the following medications:  Current Outpatient Medications  Medication Sig Dispense Refill   acetaminophen (TYLENOL) 325 MG tablet Take 2 tablets (650 mg total) by mouth every 6 (six) hours as needed. 36 tablet 0   gabapentin (NEURONTIN) 300 MG capsule Take 1 capsule (300 mg total) by mouth 3 (three) times daily. 90 capsule 3   metroNIDAZOLE (FLAGYL) 500 MG tablet Take 1 tablet (500 mg total) by mouth 2 (two) times daily for 7 days. 14 tablet 0   potassium chloride SA (KLOR-CON) 20 MEQ tablet Take 1 tablet (20 mEq total) by mouth daily. 30 tablet 1   sertraline (ZOLOFT) 100 MG tablet Take 1 tablet (100 mg total) by mouth daily. 30 tablet 3   Vitamin D, Ergocalciferol, (DRISDOL) 1.25 MG (50000 UNIT) CAPS capsule Take 1 capsule (50,000 Units total) by mouth every 7 (seven) days. 5 capsule 6   No current facility-administered medications for this visit.    Patient confirms/reports the following allergies:  Allergies  Allergen Reactions   Codeine Itching and Nausea And Vomiting   Iodine     Unknown  Patient states that her mother had seizure reaction to  Iodine.     No orders of the defined types were placed in this encounter.   AUTHORIZATION INFORMATION Primary Insurance: 1D#: Group #:  Secondary Insurance: 1D#: Group #:  SCHEDULE INFORMATION: Date: 05/18/2021 Time: Location: [place]

## 2021-04-20 ENCOUNTER — Ambulatory Visit
Admission: RE | Admit: 2021-04-20 | Discharge: 2021-04-20 | Disposition: A | Discharge status: Home / Self Care | Payer: 59 | Source: Ambulatory Visit | Attending: Nurse Practitioner | Admitting: Nurse Practitioner

## 2021-04-20 DIAGNOSIS — N6310 Unspecified lump in the right breast, unspecified quadrant: Secondary | ICD-10-CM

## 2021-05-02 DIAGNOSIS — Z860101 Personal history of adenomatous and serrated colon polyps: Secondary | ICD-10-CM

## 2021-05-02 HISTORY — DX: Personal history of adenomatous and serrated colon polyps: Z86.0101

## 2021-05-04 ENCOUNTER — Telehealth: Payer: Self-pay

## 2021-05-04 NOTE — Telephone Encounter (Signed)
Patient had called office about not being able to get prep I put sample at front office for her

## 2021-05-17 ENCOUNTER — Encounter: Payer: Self-pay | Admitting: Gastroenterology

## 2021-05-18 ENCOUNTER — Ambulatory Visit
Admission: RE | Admit: 2021-05-18 | Discharge: 2021-05-18 | Disposition: A | Discharge status: Home / Self Care | Payer: 59 | Attending: Gastroenterology | Admitting: Gastroenterology

## 2021-05-18 ENCOUNTER — Encounter: Payer: Self-pay | Admitting: Gastroenterology

## 2021-05-18 ENCOUNTER — Ambulatory Visit: Payer: 59 | Admitting: Anesthesiology

## 2021-05-18 ENCOUNTER — Other Ambulatory Visit: Payer: Self-pay

## 2021-05-18 ENCOUNTER — Encounter
Admission: RE | Disposition: A | Discharge status: Home / Self Care | Payer: Self-pay | Source: Home / Self Care | Attending: Gastroenterology

## 2021-05-18 DIAGNOSIS — E119 Type 2 diabetes mellitus without complications: Secondary | ICD-10-CM | POA: Diagnosis not present

## 2021-05-18 DIAGNOSIS — D123 Benign neoplasm of transverse colon: Secondary | ICD-10-CM | POA: Diagnosis not present

## 2021-05-18 DIAGNOSIS — F32A Depression, unspecified: Secondary | ICD-10-CM | POA: Insufficient documentation

## 2021-05-18 DIAGNOSIS — D126 Benign neoplasm of colon, unspecified: Secondary | ICD-10-CM

## 2021-05-18 DIAGNOSIS — Z1211 Encounter for screening for malignant neoplasm of colon: Secondary | ICD-10-CM | POA: Insufficient documentation

## 2021-05-18 DIAGNOSIS — F419 Anxiety disorder, unspecified: Secondary | ICD-10-CM | POA: Diagnosis not present

## 2021-05-18 DIAGNOSIS — K5730 Diverticulosis of large intestine without perforation or abscess without bleeding: Secondary | ICD-10-CM | POA: Diagnosis not present

## 2021-05-18 DIAGNOSIS — D125 Benign neoplasm of sigmoid colon: Secondary | ICD-10-CM | POA: Insufficient documentation

## 2021-05-18 DIAGNOSIS — D122 Benign neoplasm of ascending colon: Secondary | ICD-10-CM | POA: Insufficient documentation

## 2021-05-18 DIAGNOSIS — F17210 Nicotine dependence, cigarettes, uncomplicated: Secondary | ICD-10-CM | POA: Diagnosis not present

## 2021-05-18 DIAGNOSIS — J449 Chronic obstructive pulmonary disease, unspecified: Secondary | ICD-10-CM | POA: Insufficient documentation

## 2021-05-18 HISTORY — PX: COLONOSCOPY WITH PROPOFOL: SHX5780

## 2021-05-18 LAB — POCT PREGNANCY, URINE: Preg Test, Ur: NEGATIVE

## 2021-05-18 SURGERY — COLONOSCOPY WITH PROPOFOL
Anesthesia: General

## 2021-05-18 MED ORDER — STERILE WATER FOR IRRIGATION IR SOLN
Status: DC | PRN
  Administered 2021-05-18: 60 mL

## 2021-05-18 MED ORDER — PROPOFOL 500 MG/50ML IV EMUL
INTRAVENOUS | Status: AC
  Filled 2021-05-18: qty 100

## 2021-05-18 MED ORDER — PROPOFOL 10 MG/ML IV BOLUS
INTRAVENOUS | Status: DC | PRN
  Administered 2021-05-18: 100 mg via INTRAVENOUS

## 2021-05-18 MED ORDER — LIDOCAINE 2% (20 MG/ML) 5 ML SYRINGE
INTRAMUSCULAR | Status: DC | PRN
Start: 2021-05-18 — End: 2021-05-18
  Administered 2021-05-18: 50 mg via INTRAVENOUS

## 2021-05-18 MED ORDER — SODIUM CHLORIDE 0.9 % IV SOLN: INTRAVENOUS | Status: DC

## 2021-05-18 MED ORDER — PROPOFOL 500 MG/50ML IV EMUL
INTRAVENOUS | Status: DC | PRN
  Administered 2021-05-18: 200 ug/kg/min via INTRAVENOUS

## 2021-05-18 MED ORDER — PHENYLEPHRINE HCL-NACL 20-0.9 MG/250ML-% IV SOLN
INTRAVENOUS | Status: AC
  Filled 2021-05-18: qty 250

## 2021-05-18 NOTE — H&P (Signed)
Jonathon Bellows, MD [address], [place], [place], [place], [address] [address], [place], [place], [place], [address] Phone: [phone]  Fax: [phone]  Primary Care Physician:  Bo Merino I, NP   Pre-Procedure History & Physical: HPI:  Erin Zamora is a 51 y.o. female is here for an colonoscopy.   Past Medical History:  Diagnosis Date   Allergy    seasonal   Anemia    Anxiety    CAP (community acquired pneumonia) 10/2018   Cough 10/2018   Hyperlipidemia 10/2018   Hypokalemia    Insomnia 10/2018   Microcytic anemia    Skin lesions 10/2019   Thrombocytosis    Vitamin D deficiency 10/2018    Past Surgical History:  Procedure Laterality Date   APPENDECTOMY  1991   CHOLECYSTECTOMY  2003   TUBAL LIGATION  2003    Prior to Admission medications   Medication Sig Start Date End Date Taking? Authorizing Provider  acetaminophen (TYLENOL) 325 MG tablet Take 2 tablets (650 mg total) by mouth every 6 (six) hours as needed. 01/04/21  Yes Wynona Dove A, DO  sertraline (ZOLOFT) 100 MG tablet Take 1 tablet (100 mg total) by mouth daily. 01/05/21  Yes Passmore, Jake Church I, NP  gabapentin (NEURONTIN) 300 MG capsule Take 1 capsule (300 mg total) by mouth 3 (three) times daily. 01/05/21   Passmore, Jake Church I, NP  potassium chloride SA (KLOR-CON) 20 MEQ tablet Take 1 tablet (20 mEq total) by mouth daily. 01/05/21   Passmore, Jake Church I, NP  Vitamin D, Ergocalciferol, (DRISDOL) 1.25 MG (50000 UNIT) CAPS capsule Take 1 capsule (50,000 Units total) by mouth every 7 (seven) days. 01/05/21   Bo Merino I, NP    Allergies as of 04/15/2021 - Review Complete 04/13/2021  Allergen Reaction Noted   Codeine Itching and Nausea And Vomiting 02/18/2012   Iodine  07/24/2020    Family History  Problem Relation Age of Onset   Alcohol abuse Mother    COPD Mother    Hypertension Mother    Alcohol abuse Father    Diabetes Father    Drug abuse Father    Heart disease Father     Hypertension Father    Kidney disease Father    Arthritis Maternal Grandmother    Arthritis Maternal Grandfather    Melanoma Maternal Grandfather    Cancer Paternal Grandmother    Diabetes Paternal Grandmother    Heart disease Paternal Grandmother    Diabetes Paternal Grandfather    Heart disease Paternal Grandfather    Kidney disease Paternal Grandfather    Stroke Paternal Grandfather     Social History   Socioeconomic History   Marital status: Single    Spouse name: Not on file   Number of children: Not on file   Years of education: Not on file   Highest education level: Not on file  Occupational History   Not on file  Tobacco Use   Smoking status: Every Day    Packs/day: 0.50    Types: Cigarettes    Last attempt to quit: 10/01/2018    Years since quitting: 2.6   Smokeless tobacco: Never   Tobacco comments:    5-7 ciggs per day  Vaping Use   Vaping Use: Never used  Substance and Sexual Activity   Alcohol use: Yes    Alcohol/week: 0.0 standard drinks    Comment: rare   Drug use: No   Sexual activity: Yes    Partners: Male  Birth control/protection: None  Other Topics Concern   Not on file  Social History Narrative   50 year old and 84 y/o daughters   20 year old granddaughter - takes care of her in the afternoon after school   Significant other - 15 years   Work: Web designer   Social Determinants of Radio broadcast assistant Strain: Not on Art therapist Insecurity: Not on file  Transportation Needs: Not on file  Physical Activity: Not on file  Stress: Not on file  Social Connections: Not on file  Intimate Partner Violence: Not on file    Review of Systems: See HPI, otherwise negative ROS  Physical Exam: BP (!) 164/110    Pulse 78    Temp (!) 96.5 F (35.8 C) (Temporal)    Resp 20    Ht 5\' 5"  (1.651 m)    Wt 106.6 kg    SpO2 94%    BMI 39.11 kg/m  General:   Alert,  pleasant and cooperative in NAD Head:  Normocephalic and  atraumatic. Neck:  Supple; no masses or thyromegaly. Lungs:  Clear throughout to auscultation, normal respiratory effort.    Heart:  +S1, +S2, Regular rate and rhythm, No edema. Abdomen:  Soft, nontender and nondistended. Normal bowel sounds, without guarding, and without rebound.   Neurologic:  Alert and  oriented x4;  grossly normal neurologically.  Impression/Plan: Erin Zamora is here for an colonoscopy to be performed for Screening colonoscopy average risk   Risks, benefits, limitations, and alternatives regarding  colonoscopy have been reviewed with the patient.  Questions have been answered.  All parties agreeable.   Jonathon Bellows, MD  05/18/2021, 7:42 AM

## 2021-05-18 NOTE — Anesthesia Preprocedure Evaluation (Signed)
Anesthesia Evaluation  Patient identified by MRN, date of birth, ID band Patient awake    Reviewed: Allergy & Precautions, H&P , NPO status , Patient's Chart, lab work & pertinent test results, reviewed documented beta blocker date and time   History of Anesthesia Complications Negative for: history of anesthetic complications  Airway Mallampati: I  TM Distance: >3 FB Neck ROM: full    Dental  (+) Dental Advidsory Given, Chipped, Missing, Poor Dentition   Pulmonary neg pulmonary ROS, neg shortness of breath, COPD, Recent URI , Residual Cough, Current Smoker and Patient abstained from smoking.,    Pulmonary exam normal breath sounds clear to auscultation       Cardiovascular Exercise Tolerance: Good negative cardio ROS Normal cardiovascular exam Rhythm:regular Rate:Normal     Neuro/Psych PSYCHIATRIC DISORDERS Anxiety Depression negative neurological ROS     GI/Hepatic negative GI ROS, Neg liver ROS,   Endo/Other  diabetes (borderline)  Renal/GU negative Renal ROS  negative genitourinary   Musculoskeletal   Abdominal   Peds  Hematology negative hematology ROS (+)   Anesthesia Other Findings Past Medical History: No date: Allergy     Comment:  seasonal No date: Anemia No date: Anxiety 10/2018: CAP (community acquired pneumonia) 10/2018: Cough 10/2018: Hyperlipidemia No date: Hypokalemia 10/2018: Insomnia No date: Microcytic anemia 10/2019: Skin lesions No date: Thrombocytosis 10/2018: Vitamin D deficiency   Reproductive/Obstetrics negative OB ROS                             Anesthesia Physical Anesthesia Plan  ASA: 2  Anesthesia Plan: General   Post-op Pain Management:    Induction: Intravenous  PONV Risk Score and Plan: 2 and Propofol infusion and TIVA  Airway Management Planned: Natural Airway and Nasal Cannula  Additional Equipment:   Intra-op Plan:    Post-operative Plan:   Informed Consent: I have reviewed the patients History and Physical, chart, labs and discussed the procedure including the risks, benefits and alternatives for the proposed anesthesia with the patient or authorized representative who has indicated his/her understanding and acceptance.     Dental Advisory Given  Plan Discussed with: Anesthesiologist, CRNA and Surgeon  Anesthesia Plan Comments:         Anesthesia Quick Evaluation

## 2021-05-18 NOTE — Op Note (Signed)
[facility] Gastroenterology Patient Name: Erin Zamora Procedure Date: 05/18/2021 7:00 AM MRN: [id number] Account #: [id number] Date of Birth: 05-24-1970 Admit Type: Outpatient Age: 51 Room: [facility] ENDO ROOM 2 Gender: Female Note Status: Finalized Instrument Name: Jasper Riling [id number] Procedure:             Colonoscopy Indications:           Screening for colorectal malignant neoplasm Providers:             Jonathon Bellows MD, MD Referring MD:          Teena Dunk (Referring MD) Medicines:             Monitored Anesthesia Care Complications:         No immediate complications. Procedure:             Pre-Anesthesia Assessment:                        - Prior to the procedure, a History and Physical was                         performed, and patient medications, allergies and                         sensitivities were reviewed. The patient's tolerance                         of previous anesthesia was reviewed.                        - The risks and benefits of the procedure and the                         sedation options and risks were discussed with the                         patient. All questions were answered and informed                         consent was obtained.                        - ASA Grade Assessment: II - A patient with mild                         systemic disease.                        After obtaining informed consent, the colonoscope was                         passed under direct vision. Throughout the procedure,                         the patient's blood pressure, pulse, and oxygen                         saturations were monitored continuously. The                         Colonoscope was introduced  through the anus and                         advanced to the the cecum, identified by the                         appendiceal orifice. The colonoscopy was performed                         with ease. The patient tolerated the procedure  well.                         The quality of the bowel preparation was good. Findings:      The perianal and digital rectal examinations were normal.      Two sessile polyps were found in the ascending colon. The polyps were 5       to 7 mm in size. These polyps were removed with a cold snare. Resection       and retrieval were complete.      A 5 mm polyp was found in the transverse colon. The polyp was sessile.       The polyp was removed with a cold snare. Resection and retrieval were       complete.      Four sessile polyps were found in the recto-sigmoid colon. The polyps       were 4 to 6 mm in size. These polyps were removed with a cold snare.       Resection and retrieval were complete. To prevent bleeding after the       polypectomy, one hemostatic clip was successfully placed. There was no       bleeding during, or at the end, of the procedure.      Multiple small-mouthed diverticula were found in the sigmoid colon.      The exam was otherwise without abnormality on direct and retroflexion       views. Impression:            - Two 5 to 7 mm polyps in the ascending colon, removed                         with a cold snare. Resected and retrieved.                        - One 5 mm polyp in the transverse colon, removed with                         a cold snare. Resected and retrieved.                        - Four 4 to 6 mm polyps at the recto-sigmoid colon,                         removed with a cold snare. Resected and retrieved.                         Clip was placed.                        - Diverticulosis in the sigmoid colon.                        -  The examination was otherwise normal on direct and                         retroflexion views. Recommendation:        - Discharge patient to home (ambulatory).                        - Resume previous diet.                        - Continue present medications.                        - Await pathology results.                         - Repeat colonoscopy in 3 years for surveillance. Procedure Code(s):     --- Professional ---                        [phone], Colonoscopy, flexible; with removal of                         tumor(s), polyp(s), or other lesion(s) by snare                         technique Diagnosis Code(s):     --- Professional ---                        K63.5, Polyp of colon                        Z12.11, Encounter for screening for malignant neoplasm                         of colon                        K57.30, Diverticulosis of large intestine without                         perforation or abscess without bleeding CPT copyright 2019 American Medical Association. All rights reserved. The codes documented in this report are preliminary and upon coder review may  be revised to meet current compliance requirements. Jonathon Bellows, MD Jonathon Bellows MD, MD 05/18/2021 8:21:50 AM This report has been signed electronically. Number of Addenda: 0 Note Initiated On: 05/18/2021 7:00 AM Scope Withdrawal Time: 0 hours 13 minutes 22 seconds  Total Procedure Duration: 0 hours 19 minutes 11 seconds  Estimated Blood Loss:  Estimated blood loss: none.      [facility]

## 2021-05-18 NOTE — Transfer of Care (Signed)
Immediate Anesthesia Transfer of Care Note  Patient: Erin Zamora  Procedure(s) Performed: COLONOSCOPY WITH PROPOFOL  Patient Location: Endoscopy Unit  Anesthesia Type:General  Level of Consciousness: awake  Airway & Oxygen Therapy: Patient Spontanous Breathing  Post-op Assessment: Report given to RN and Post -op Vital signs reviewed and stable  Post vital signs: Reviewed and stable  Last Vitals:  Vitals Value Taken Time  BP 149/93   Temp    Pulse    Resp    SpO2 95     Last Pain:  Vitals:   05/18/21 0727  TempSrc: Temporal  PainSc: 0-No pain         Complications: No notable events documented.

## 2021-05-19 ENCOUNTER — Encounter: Payer: Self-pay | Admitting: Gastroenterology

## 2021-05-19 LAB — SURGICAL PATHOLOGY

## 2021-05-24 NOTE — Anesthesia Postprocedure Evaluation (Signed)
Anesthesia Post Note  Patient: Erin Zamora  Procedure(s) Performed: COLONOSCOPY WITH PROPOFOL  Patient location during evaluation: Endoscopy Anesthesia Type: General Level of consciousness: awake and alert Pain management: pain level controlled Vital Signs Assessment: post-procedure vital signs reviewed and stable Respiratory status: spontaneous breathing, nonlabored ventilation, respiratory function stable and patient connected to nasal cannula oxygen Cardiovascular status: blood pressure returned to baseline and stable Postop Assessment: no apparent nausea or vomiting Anesthetic complications: no   No notable events documented.   Last Vitals:  Vitals:   05/18/21 0836 05/18/21 0846  BP: (!) 165/110   Pulse: 74 73  Resp: (!) 21 19  Temp:    SpO2: 97% 98%    Last Pain:  Vitals:   05/18/21 0846  TempSrc:   PainSc: 0-No pain                 Martha Clan

## 2021-06-02 ENCOUNTER — Ambulatory Visit: Payer: 59 | Admitting: Obstetrics and Gynecology

## 2021-06-02 ENCOUNTER — Other Ambulatory Visit: Payer: Self-pay

## 2021-06-02 ENCOUNTER — Encounter: Payer: Self-pay | Admitting: Obstetrics and Gynecology

## 2021-06-02 VITALS — BP 143/90 | Ht 65.0 in | Wt 235.0 lb

## 2021-06-02 DIAGNOSIS — R350 Frequency of micturition: Secondary | ICD-10-CM | POA: Diagnosis not present

## 2021-06-02 DIAGNOSIS — N393 Stress incontinence (female) (male): Secondary | ICD-10-CM

## 2021-06-02 DIAGNOSIS — N8110 Cystocele, unspecified: Secondary | ICD-10-CM

## 2021-06-02 DIAGNOSIS — N812 Incomplete uterovaginal prolapse: Secondary | ICD-10-CM

## 2021-06-02 DIAGNOSIS — N3281 Overactive bladder: Secondary | ICD-10-CM

## 2021-06-02 LAB — POCT URINALYSIS DIP (CLINITEK)
Bilirubin, UA: NEGATIVE
Glucose, UA: NEGATIVE mg/dL
Ketones, POC UA: NEGATIVE mg/dL
Leukocytes, UA: NEGATIVE
Nitrite, UA: NEGATIVE
POC PROTEIN,UA: NEGATIVE
Spec Grav, UA: >=1.03 — AB (ref 1.010–1.025)
Urobilinogen, UA: 0.2 E.U./dL
pH, UA: 6 (ref 5.0–8.0)

## 2021-06-02 NOTE — Progress Notes (Signed)
[facility] New Patient Evaluation and Consultation  Referring Provider: Teena Dunk, NP PCP: Teena Dunk, NP Date of Service: 06/02/2021  SUBJECTIVE Chief Complaint: New Patient (Initial Visit) (incontinence)  History of Present Illness: Erin Zamora is a 51 y.o. White or Caucasian female seen in consultation at the request of NP Bo Merino for evaluation of incontinence.    Review of records from NP [place] significant for: Has urinary leakage most noticeable with cough or sneeze.   Urinary Symptoms: Leaks urine with cough/ sneeze, laughing, lifting, going from sitting to standing, with a full bladder, and with urgency. UUI = SUI, but will have large volume leakage with SUI and this is more bothersome to her.  Worsened in the last year.  Leaks several time(s) per day.  Pad use: 2- 3 pads per day.   She is bothered by her UI symptoms.  Day time voids 5-8.  Nocturia: 0 times per night to void. Voiding dysfunction: she does not empty her bladder well.  does not use a catheter to empty bladder.  When urinating, she feels dribbling after finishing and the need to urinate multiple times in a row Drinks: coffee, water- tries not to drink much  UTIs: 3 UTI's in the last year.   Reports history of blood in urine  Pelvic Organ Prolapse Symptoms:                  She Denies a feeling of a bulge the vaginal area.   Bowel Symptom: Bowel movements: 1 time(s) per day Stool consistency: soft  Straining: yes.  Splinting: yes.  Incomplete evacuation: yes.  She Denies accidental bowel leakage / fecal incontinence Bowel regimen: none Last colonoscopy: Date 05/19/21, Results-precancerous polyps removed  Sexual Function Sexually active: yes.  Pain with sex: No  Pelvic Pain Denies pelvic pain   Past Medical History:  Past Medical History:  Diagnosis Date   Allergy    seasonal   Anemia    Anxiety    CAP (community acquired pneumonia) 10/2018    Cough 10/2018   Hyperlipidemia 10/2018   Hypokalemia    Insomnia 10/2018   Microcytic anemia    Skin lesions 10/2019   Thrombocytosis    Vitamin D deficiency 10/2018     Past Surgical History:   Past Surgical History:  Procedure Laterality Date   APPENDECTOMY  1991   CHOLECYSTECTOMY  2003   COLONOSCOPY WITH PROPOFOL N/A 05/18/2021   Procedure: COLONOSCOPY WITH PROPOFOL;  Surgeon: Jonathon Bellows, MD;  Location: [facility] ENDOSCOPY;  Service: Gastroenterology;  Laterality: N/A;   TUBAL LIGATION  2003     Past OB/GYN History: OB History  Gravida Para Term Preterm AB Living  2         2  SAB IAB Ectopic Multiple Live Births               # Outcome Date GA Lbr Len/2nd Weight Sex Delivery Anes PTL Lv  2 Gravida           1 Gravida             Vaginal deliveries: 2 Patient's last menstrual period was 05/08/2021 (approximate). Contraception: none. Last pap smear was 04/2021- neg.     Medications: She has a current medication list which includes the following prescription(s): acetaminophen, gabapentin, potassium chloride sa, sertraline, and vitamin d (ergocalciferol).   Allergies: Patient is allergic to codeine and iodine.   Social History:  Social History   Tobacco Use  Smoking status: Every Day    Packs/day: 0.50    Types: Cigarettes    Last attempt to quit: 10/01/2018    Years since quitting: 2.6   Smokeless tobacco: Never   Tobacco comments:    5-7 ciggs per day  Vaping Use   Vaping Use: Never used  Substance Use Topics   Alcohol use: Yes    Alcohol/week: 0.0 standard drinks    Comment: rare   Drug use: No    Relationship status: long-term partner She lives with partner and child.   She is employed. Regular exercise: No History of abuse: No  Family History:   Family History  Problem Relation Age of Onset   Alcohol abuse Mother    COPD Mother    Hypertension Mother    Alcohol abuse Father    Diabetes Father    Drug abuse Father    Heart disease  Father    Hypertension Father    Kidney disease Father    Arthritis Maternal Grandmother    Arthritis Maternal Grandfather    Melanoma Maternal Grandfather    Cancer Paternal Grandmother    Diabetes Paternal Grandmother    Heart disease Paternal Grandmother    Diabetes Paternal Grandfather    Heart disease Paternal Grandfather    Kidney disease Paternal Grandfather    Stroke Paternal Grandfather      Review of Systems: Review of Systems  Constitutional:  Positive for malaise/fatigue. Negative for fever and weight loss.  Respiratory:  Negative for cough, shortness of breath and wheezing.   Cardiovascular:  Positive for leg swelling. Negative for chest pain and palpitations.  Gastrointestinal:  Negative for abdominal pain and blood in stool.  Genitourinary:  Positive for dysuria.       + abnormal periods  Musculoskeletal:  Negative for myalgias.  Skin:  Negative for rash.  Neurological:  Negative for dizziness and headaches.  Endo/Heme/Allergies:  Does not bruise/bleed easily.  Psychiatric/Behavioral:  Negative for depression. The patient is nervous/anxious.     OBJECTIVE Physical Exam: Vitals:   06/02/21 1635  BP: (!) 143/90  Weight: 235 lb (106.6 kg)  Height: 5\' 5"  (1.651 m)    Physical Exam Constitutional:      General: She is not in acute distress. Pulmonary:     Effort: Pulmonary effort is normal.  Abdominal:     General: There is no distension.     Palpations: Abdomen is soft.     Tenderness: There is no abdominal tenderness. There is no rebound.  Musculoskeletal:        General: No swelling. Normal range of motion.  Skin:    General: Skin is warm and dry.     Findings: No rash.  Neurological:     Mental Status: She is alert and oriented to person, place, and time.  Psychiatric:        Mood and Affect: Mood normal.        Behavior: Behavior normal.     GU / Detailed Urogynecologic Evaluation:  Pelvic Exam: Normal external female genitalia; Bartholin's  and Skene's glands normal in appearance; urethral meatus normal in appearance, no urethral masses or discharge.   CST: positive  Speculum exam reveals normal vaginal mucosa with atrophy. Cervix normal appearance. Uterus normal single, nontender. Adnexa no mass, fullness, tenderness.     Pelvic floor strength III/V  Pelvic floor musculature: Right levator non-tender, Right obturator non-tender, Left levator non-tender, Left obturator non-tender  POP-Q:   POP-Q  -1  Aa   -1                                           Ba  -6                                              C   4.5                                            Gh  4                                            Pb  11                                            tvl   -3                                            Ap  -3                                            Bp  -9                                              D     Rectal Exam:  Normal external rectum  Post-Void Residual (PVR) by Bladder Scan: In order to evaluate bladder emptying, we discussed obtaining a postvoid residual and she agreed to this procedure.  Procedure: The ultrasound unit was placed on the patient's abdomen in the suprapubic region after the patient had voided. A PVR of 70 ml was obtained by bladder scan.  Laboratory Results: POC urine: trace blood, otherwise negative   ASSESSMENT AND PLAN Ms. Halbleib is a 51 y.o. with:  1. SUI (stress urinary incontinence, female)   2. Overactive bladder   3. Frequency of micturition   4. Prolapse of anterior vaginal wall   5. Uterovaginal prolapse, incomplete    SUI - For treatment of stress urinary incontinence,  non-surgical options include expectant management, weight loss, physical therapy, as well as a pessary.  Surgical options include a midurethral sling, Burch urethropexy, and transurethral injection of a bulking agent. - She is interested in urethral  bulking and demonstrated stress incontinence today. She will return for the procedure.   2. OAB - We discussed the symptoms of overactive bladder (OAB), which include urinary urgency, urinary frequency, nocturia, with or without urge incontinence.  While we do not know the exact etiology of OAB, several treatment options exist. We discussed management including behavioral therapy (  decreasing bladder irritants, urge suppression strategies, timed voids, bladder retraining), physical therapy, medication.  - She would like to focus on her SUI first, but she is also considering a medication.   3. Stage II anterior, Stage I posterior, Stage I apical prolapse - asymptomatic from prolapse, will monitor  Jaquita Folds, MD

## 2021-06-07 ENCOUNTER — Encounter: Payer: Self-pay | Admitting: Nurse Practitioner

## 2021-06-07 NOTE — Progress Notes (Signed)
[employer] contacted re: check if PA if needed for cysto w/ urethral bulking (18097) and synthetic implant (Q4492) DX code N39.3  NO PA Needed. Ref# [id number]

## 2021-06-23 ENCOUNTER — Other Ambulatory Visit: Payer: Self-pay | Admitting: Nurse Practitioner

## 2021-06-23 DIAGNOSIS — F411 Generalized anxiety disorder: Secondary | ICD-10-CM

## 2021-06-23 DIAGNOSIS — F3342 Major depressive disorder, recurrent, in full remission: Secondary | ICD-10-CM

## 2021-07-06 ENCOUNTER — Ambulatory Visit: Payer: 59 | Admitting: Obstetrics and Gynecology

## 2021-07-12 ENCOUNTER — Ambulatory Visit: Payer: 59 | Admitting: Dermatology

## 2021-08-02 ENCOUNTER — Other Ambulatory Visit: Payer: Self-pay | Admitting: Nurse Practitioner

## 2021-08-02 DIAGNOSIS — F411 Generalized anxiety disorder: Secondary | ICD-10-CM

## 2021-08-02 DIAGNOSIS — F3342 Major depressive disorder, recurrent, in full remission: Secondary | ICD-10-CM

## 2021-08-03 ENCOUNTER — Other Ambulatory Visit: Payer: Self-pay | Admitting: Obstetrics and Gynecology

## 2021-08-03 DIAGNOSIS — Z01818 Encounter for other preprocedural examination: Secondary | ICD-10-CM

## 2021-08-03 MED ORDER — CEPHALEXIN 250 MG PO CAPS: 250.0000 mg | ORAL_CAPSULE | Freq: Four times a day (QID) | ORAL | 0 refills | Status: DC

## 2021-08-03 MED ORDER — DIAZEPAM 5 MG PO TABS: ORAL_TABLET | ORAL | 0 refills | Status: DC

## 2021-08-04 ENCOUNTER — Ambulatory Visit: Payer: 59 | Admitting: Obstetrics and Gynecology

## 2021-08-04 ENCOUNTER — Encounter: Payer: Self-pay | Admitting: Obstetrics and Gynecology

## 2021-08-04 VITALS — BP 146/95 | HR 83

## 2021-08-04 DIAGNOSIS — N393 Stress incontinence (female) (male): Secondary | ICD-10-CM

## 2021-08-04 DIAGNOSIS — R350 Frequency of micturition: Secondary | ICD-10-CM

## 2021-08-04 LAB — POCT URINALYSIS DIPSTICK
Appearance: NORMAL
Bilirubin, UA: NEGATIVE
Glucose, UA: NEGATIVE
Ketones, UA: NEGATIVE
Leukocytes, UA: NEGATIVE
Nitrite, UA: NEGATIVE
Protein, UA: NEGATIVE
Spec Grav, UA: 1.025 (ref 1.010–1.025)
Urobilinogen, UA: 0.2 E.U./dL
pH, UA: 7 (ref 5.0–8.0)

## 2021-08-04 NOTE — Progress Notes (Signed)
Urethral Bulking Injection ? ?CC: 51 y.o. F with stress incontinence who presents for transurethral bulking injection with Bulkamid.  ? ?We discussed the risks and benefits of the procedure and patient signed her consent form.  She started antibiotic prophylaxis with keflex today. She took '5mg'$  valium prior to the procedure.  ? ?Procedure: ?  A 0 degree cystoscope was inserted into the urethra into the bladder.  The needle was primed.  The cystoscope was inserted to the level of the bladder neck.  The needle was inserted 2 cm and the scope was pulled back into the urethra 2 cm.  The needle was inserted bevel up at the 5 o'clock position and the Bulkamid was injected to obtain coaptation.  This was repeated at the 2 o'clock,  10 o'clock and 7 o'clock positions.   A total of 2- 42m syringes were used and good circumferential coaptation was noted.  The patient tolerated the procedure well was taken to recovery room in stable condition all counts were correct x2. ? ?Post-Void Residual (PVR) by Bladder Scan: ?In order to evaluate bladder emptying, we discussed obtaining a postvoid residual and she agreed to this procedure.  ?Procedure: The ultrasound unit was placed on the patient's abdomen in the suprapubic region after the patient had voided. A PVR of 35 ml was obtained by bladder scan. ? ?ASSESSMENT: ?51y.o. s/p transurethral Bulkamid injection for stress incontinence.  ? ?PLAN: ?Patient will follow up in 4 weeks to reassess. Voiding and post-procedure precautions were given. She will return for heavy bleeding, fevers, dysuria lasting beyond today and incomplete emptying. ? ?All questions were answered. ? ?MJaquita Folds MD ? ? ? ? ?

## 2021-08-04 NOTE — Addendum Note (Signed)
Addended by: Elita Quick on: 08/04/2021 10:11 AM ? ? Modules accepted: Orders ? ?

## 2021-08-04 NOTE — Patient Instructions (Addendum)
Taking Care of Yourself after Urodynamics, Cystoscopy, urethral bulking or Botox Injection  ? ?Drink plenty of water for a day or two following your procedure. Try to have about 8 ounces (one cup) at a time, and do this 6 times or more per day unless you have fluid restrictitons ?AVOID irritative beverages such as coffee, tea, soda, alcoholic or citrus drinks for a day or two, as this may cause burning with urination. ? For the first 1-2 days after the procedure, your urine may be pink or red in color. You may have some blood in your urine as a normal side effect of the procedure. Large amounts of bleeding or difficulty urinating are NOT normal. Call the nurse line if this happens or go to the nearest Emergency Room if the bleeding is heavy or you cannot urinate at all and it is after hours. If you had a urethral bulking injection in the urethra and need to be catheterized afterward, ask for a pediatric catheter to be used (size 10 or 12-French) so the material is not pushed out of place.   ?You may experience some discomfort or a burning sensation with urination after having this procedure. You can use over the counter Azo or pyridium to help with burning and follow the instructions on the packaging. If it does not improve within 1-2 days, or other symptoms appear (fever, chills, or difficulty urinating) call the office to speak to a nurse.  ?You may return to normal daily activities such as work, school, driving, exercising and housework on the day of the procedure. ?If your doctor gave you a prescription, take it as ordered.  ? ? ? ?

## 2021-08-13 ENCOUNTER — Ambulatory Visit: Payer: 59 | Admitting: Obstetrics and Gynecology

## 2021-09-01 ENCOUNTER — Telehealth: Payer: Self-pay | Admitting: Obstetrics and Gynecology

## 2021-09-01 ENCOUNTER — Ambulatory Visit: Payer: 59 | Admitting: Obstetrics and Gynecology

## 2021-09-20 ENCOUNTER — Other Ambulatory Visit: Payer: Self-pay | Admitting: Nurse Practitioner

## 2021-09-20 DIAGNOSIS — F3342 Major depressive disorder, recurrent, in full remission: Secondary | ICD-10-CM

## 2021-09-20 DIAGNOSIS — F411 Generalized anxiety disorder: Secondary | ICD-10-CM

## 2021-09-23 ENCOUNTER — Telehealth: Payer: Self-pay | Admitting: Nurse Practitioner

## 2021-09-23 NOTE — Telephone Encounter (Signed)
Patient requesting Prednisone '20mg'$  for sciatic pain that started about 1.5 weeks ago. States Jake Church has prescribed previously for same issue.

## 2021-09-24 NOTE — Telephone Encounter (Signed)
Contacted patient. Stated she will go to urgent care upcoming appt 6/6.

## 2021-10-05 ENCOUNTER — Encounter: Payer: Self-pay | Admitting: Nurse Practitioner

## 2021-10-05 ENCOUNTER — Ambulatory Visit: Payer: 59 | Admitting: Nurse Practitioner

## 2021-10-05 VITALS — BP 155/83 | HR 69 | Temp 97.9°F | Ht 65.0 in | Wt 247.2 lb

## 2021-10-05 DIAGNOSIS — F3342 Major depressive disorder, recurrent, in full remission: Secondary | ICD-10-CM | POA: Diagnosis not present

## 2021-10-05 DIAGNOSIS — M25562 Pain in left knee: Secondary | ICD-10-CM | POA: Diagnosis not present

## 2021-10-05 DIAGNOSIS — F411 Generalized anxiety disorder: Secondary | ICD-10-CM

## 2021-10-05 DIAGNOSIS — F32A Depression, unspecified: Secondary | ICD-10-CM | POA: Diagnosis not present

## 2021-10-05 DIAGNOSIS — E559 Vitamin D deficiency, unspecified: Secondary | ICD-10-CM

## 2021-10-05 DIAGNOSIS — L989 Disorder of the skin and subcutaneous tissue, unspecified: Secondary | ICD-10-CM

## 2021-10-05 DIAGNOSIS — I10 Essential (primary) hypertension: Secondary | ICD-10-CM

## 2021-10-05 MED ORDER — LISINOPRIL 40 MG PO TABS: 40.0000 mg | ORAL_TABLET | Freq: Every day | ORAL | 3 refills | Status: DC

## 2021-10-05 MED ORDER — SERTRALINE HCL 100 MG PO TABS: 100.0000 mg | ORAL_TABLET | Freq: Every day | ORAL | 1 refills | Status: DC

## 2021-10-05 MED ORDER — VITAMIN D (ERGOCALCIFEROL) 1.25 MG (50000 UNIT) PO CAPS: 50000.0000 [IU] | ORAL_CAPSULE | ORAL | 6 refills | Status: DC

## 2021-10-05 NOTE — Progress Notes (Signed)
[facility] [place], [place]  [address] Phone:  [phone]   Fax:  [phone] Subjective:   Patient ID: Erin Zamora, female    DOB: 02-14-1971, 51 y.o.   MRN: [id number]  Chief Complaint  Patient presents with   Follow-up    Pt is here for 6 months follow up visit. pt stated she has left hip and leg pain. Pain level is a 7.Pt is requesting a referral rheumatologist also her anxiety has been worst since her mother passing in march.   HPI Erin Zamora 51 y.o. female  has a past medical history of Allergy, Anemia, Anxiety, CAP (community acquired pneumonia) (10/2018), Cough (10/2018), Hyperlipidemia (10/2018), Hypokalemia, Insomnia (10/2018), Microcytic anemia, Skin lesions (10/2019), Thrombocytosis, and Vitamin D deficiency (10/2018). To the [facility] for 6 mths follow up.   States that she has had worsening left hip pain x 1 mth. Denies any trauma, fall or injury. Has been evaluated for left hip pain and receiving therapy. Requesting referral to Rheumatology for evaluation of chronic joint pain.   Patient states that she has been compliant with medications for anxiety and / depression. States that the medications are somewhat effective, but her anxiety seemingly worsened in March after the passing of he mother. Denies any other concerns.  Denies any fatigue, chest pain, shortness of breath, HA or dizziness. Denies any blurred vision, numbness or tingling.   Past Medical History:  Diagnosis Date   Allergy    seasonal   Anemia    Anxiety    CAP (community acquired pneumonia) 10/2018   Cough 10/2018   Hyperlipidemia 10/2018   Hypokalemia    Insomnia 10/2018   Microcytic anemia    Skin lesions 10/2019   Thrombocytosis    Vitamin D deficiency 10/2018    Past Surgical History:  Procedure Laterality Date   APPENDECTOMY  1991   CHOLECYSTECTOMY  2003   COLONOSCOPY WITH PROPOFOL N/A 05/18/2021   Procedure: COLONOSCOPY WITH  PROPOFOL;  Surgeon: Jonathon Bellows, MD;  Location: [facility] ENDOSCOPY;  Service: Gastroenterology;  Laterality: N/A;   TUBAL LIGATION  2003    Family History  Problem Relation Age of Onset   Alcohol abuse Mother    COPD Mother    Hypertension Mother    Alcohol abuse Father    Diabetes Father    Drug abuse Father    Heart disease Father    Hypertension Father    Kidney disease Father    Arthritis Maternal Grandmother    Arthritis Maternal Grandfather    Melanoma Maternal Grandfather    Cancer Paternal Grandmother    Diabetes Paternal Grandmother    Heart disease Paternal Grandmother    Diabetes Paternal Grandfather    Heart disease Paternal Grandfather    Kidney disease Paternal Grandfather    Stroke Paternal Grandfather     Social History   Socioeconomic History   Marital status: Single    Spouse name: Not on file   Number of children: Not on file   Years of education: Not on file   Highest education level: Not on file  Occupational History   Not on file  Tobacco Use   Smoking status: Every Day    Packs/day: 0.50    Types: Cigarettes    Last attempt to quit: 10/01/2018    Years since quitting: 3.0   Smokeless tobacco: Never   Tobacco comments:    5-7 ciggs per day  Vaping Use   Vaping  Use: Never used  Substance and Sexual Activity   Alcohol use: Yes    Alcohol/week: 0.0 standard drinks    Comment: rare   Drug use: No   Sexual activity: Yes    Partners: Male    Birth control/protection: None  Other Topics Concern   Not on file  Social History Narrative   50 year old and 50 y/o daughters   46 year old granddaughter - takes care of her in the afternoon after school   Significant other - 15 years   Work: Web designer   Social Determinants of Radio broadcast assistant Strain: Not on Art therapist Insecurity: Not on file  Transportation Needs: Not on file  Physical Activity: Not on file  Stress: Not on file  Social Connections: Not on file  Intimate  Partner Violence: Not on file    Outpatient Medications Prior to Visit  Medication Sig Dispense Refill   gabapentin (NEURONTIN) 300 MG capsule Take 1 capsule (300 mg total) by mouth 3 (three) times daily. 90 capsule 3   potassium chloride SA (KLOR-CON) 20 MEQ tablet Take 1 tablet (20 mEq total) by mouth daily. 30 tablet 1   sertraline (ZOLOFT) 100 MG tablet Take 1 tablet by mouth once daily 30 tablet 0   acetaminophen (TYLENOL) 325 MG tablet Take 2 tablets (650 mg total) by mouth every 6 (six) hours as needed. (Patient not taking: Reported on 10/05/2021) 36 tablet 0   cephALEXin (KEFLEX) 250 MG capsule Take 1 capsule (250 mg total) by mouth 4 (four) times daily. (Patient not taking: Reported on 10/05/2021) 4 capsule 0   diazepam (VALIUM) 5 MG tablet Place 1 tablet by mouth 30 minutes prior to procedure (Patient not taking: Reported on 10/05/2021) 1 tablet 0   Vitamin D, Ergocalciferol, (DRISDOL) 1.25 MG (50000 UNIT) CAPS capsule Take 1 capsule (50,000 Units total) by mouth every 7 (seven) days. (Patient not taking: Reported on 10/05/2021) 5 capsule 6   No facility-administered medications prior to visit.    Allergies  Allergen Reactions   Codeine Itching and Nausea And Vomiting   Iodine     Unknown  Patient states that her mother had seizure reaction to Iodine.     Review of Systems  Constitutional:  Negative for chills, fever and malaise/fatigue.  Respiratory:  Negative for cough and shortness of breath.   Cardiovascular:  Negative for chest pain, palpitations and leg swelling.  Gastrointestinal:  Negative for abdominal pain, blood in stool, constipation, diarrhea, nausea and vomiting.  Musculoskeletal:  Positive for joint pain. Negative for back pain, falls, myalgias and neck pain.  Skin: Negative.   Neurological: Negative.   Psychiatric/Behavioral:  Negative for depression. The patient is nervous/anxious.   All other systems reviewed and are negative.     Objective:    Physical  Exam Vitals reviewed.  Constitutional:      General: She is not in acute distress.    Appearance: Normal appearance. She is obese.  HENT:     Head: Normocephalic.  Neck:     Vascular: No carotid bruit.  Cardiovascular:     Rate and Rhythm: Normal rate and regular rhythm.     Pulses: Normal pulses.     Heart sounds: Normal heart sounds.     Comments: No obvious peripheral edema Pulmonary:     Effort: Pulmonary effort is normal.     Breath sounds: Normal breath sounds.  Musculoskeletal:        General: No swelling, tenderness,  deformity or signs of injury. Normal range of motion.     Cervical back: Normal range of motion and neck supple. No rigidity or tenderness.     Right lower leg: No edema.     Left lower leg: No edema.  Lymphadenopathy:     Cervical: No cervical adenopathy.  Skin:    General: Skin is warm and dry.     Capillary Refill: Capillary refill takes less than 2 seconds.  Neurological:     General: No focal deficit present.     Mental Status: She is alert and oriented to person, place, and time.  Psychiatric:        Mood and Affect: Mood normal.        Behavior: Behavior normal.        Thought Content: Thought content normal.        Judgment: Judgment normal.    BP (!) 155/83 (BP Location: Right Arm, Patient Position: Sitting, Cuff Size: Large)   Pulse 69   Temp 97.9 F (36.6 C)   Ht '5\' 5"'  (1.651 m)   Wt 247 lb 3.2 oz (112.1 kg)   BMI 41.14 kg/m  Wt Readings from Last 3 Encounters:  10/05/21 247 lb 3.2 oz (112.1 kg)  06/02/21 235 lb (106.6 kg)  05/18/21 235 lb (106.6 kg)     There is no immunization history on file for this patient.  Diabetic Foot Exam - Simple   No data filed     Lab Results  Component Value Date   TSH 0.961 10/14/2018   Lab Results  Component Value Date   WBC 11.7 (H) 10/05/2021   HGB 14.0 10/05/2021   HCT 40.4 10/05/2021   MCV 86 10/05/2021   PLT 491 (H) 10/05/2021   Lab Results  Component Value Date   NA 139  10/05/2021   K 4.6 10/05/2021   CO2 29 10/05/2021   GLUCOSE 113 (H) 10/05/2021   BUN 14 10/05/2021   CREATININE 0.71 10/05/2021   BILITOT 0.5 10/05/2021   ALKPHOS 71 10/05/2021   AST 9 10/05/2021   ALT 18 10/05/2021   PROT 6.3 10/05/2021   ALBUMIN 4.2 10/05/2021   CALCIUM 9.5 10/05/2021   ANIONGAP 10 07/16/2020   EGFR 104 10/05/2021   Lab Results  Component Value Date   CHOL 177 10/05/2021   CHOL 167 01/05/2021   CHOL 168 10/23/2018   Lab Results  Component Value Date   HDL 57 10/05/2021   HDL 33 (L) 01/05/2021   HDL 42 10/23/2018   Lab Results  Component Value Date   LDLCALC 107 (H) 10/05/2021   LDLCALC 90 01/05/2021   LDLCALC 80 10/23/2018   Lab Results  Component Value Date   TRIG 71 10/05/2021   TRIG 260 (H) 01/05/2021   TRIG 228 (H) 10/23/2018   Lab Results  Component Value Date   CHOLHDL 3.1 10/05/2021   CHOLHDL 5.1 (H) 01/05/2021   CHOLHDL 4.0 10/23/2018   Lab Results  Component Value Date   HGBA1C 6.7 (H) 10/05/2021   HGBA1C 6.2 (A) 04/06/2021   HGBA1C 6.2 04/06/2021   HGBA1C 6.2 04/06/2021   HGBA1C 6.2 04/06/2021       Assessment & Plan:   Problem List Items Addressed This Visit       Musculoskeletal and Integument   Skin lesions, generalized   Relevant Orders   Ambulatory referral to Dermatology Given anticipatory guidance      Other   Vitamin D deficiency   Relevant Medications  Vitamin D, Ergocalciferol, (DRISDOL) 1.25 MG (50000 UNIT) CAPS capsule, refilled without change    Other Relevant Orders   CBC with Differential/Platelet (Completed)   Comprehensive metabolic panel (Completed)   Hemoglobin A1c (Completed)   Lipid panel (Completed)   Generalized anxiety disorder   Relevant Medications   sertraline (ZOLOFT) 100 MG tablet   Recurrent major depressive disorder, in full remission ([place])   Relevant Medications   sertraline (ZOLOFT) 100 MG tablet, refilled at increased dosage    Other Relevant Orders   CBC with  Differential/Platelet (Completed)   Comprehensive metabolic panel (Completed)   Hemoglobin A1c (Completed)   Lipid panel (Completed) Encouraged counseling Given anticipatory guidance   Other Visit Diagnoses     Acute pain of left knee    -  Primary   Relevant Orders   Ambulatory referral to Rheumatology   Depression, unspecified depression type       Relevant Medications   sertraline (ZOLOFT) 100 MG tablet   Primary hypertension       Relevant Medications   lisinopril (ZESTRIL) 40 MG tablet, initiated during visit  Encouraged to begin checking B/P at home  Encouraged continued diet and exercise efforts  Encouraged continued compliance with medication     Follow up in 6 mths for wellness visit, sooner as needed     I have changed Erin Zamora's sertraline. I am also having her start on lisinopril. Additionally, I am having her maintain her acetaminophen, gabapentin, potassium chloride SA, diazepam, cephALEXin, and Vitamin D (Ergocalciferol).  Meds ordered this encounter  Medications   sertraline (ZOLOFT) 100 MG tablet    Sig: Take 1 tablet (100 mg total) by mouth daily.    Dispense:  90 tablet    Refill:  1   Vitamin D, Ergocalciferol, (DRISDOL) 1.25 MG (50000 UNIT) CAPS capsule    Sig: Take 1 capsule (50,000 Units total) by mouth every 7 (seven) days.    Dispense:  5 capsule    Refill:  6   lisinopril (ZESTRIL) 40 MG tablet    Sig: Take 1 tablet (40 mg total) by mouth daily.    Dispense:  90 tablet    Refill:  3     Teena Dunk, NP

## 2021-10-05 NOTE — Patient Instructions (Signed)
You were seen today in the [facility] for follow up of chronic illness. Labs were collected, results will be available via MyChart or, if abnormal, you will be contacted by clinic staff. You were prescribed medications, please take as directed. Please follow up in 6 mths for wellness visit

## 2021-10-06 LAB — COMPREHENSIVE METABOLIC PANEL
ALT: 18 IU/L (ref 0–32)
AST: 9 IU/L (ref 0–40)
Albumin/Globulin Ratio: 2 (ref 1.2–2.2)
Albumin: 4.2 g/dL (ref 3.8–4.8)
Alkaline Phosphatase: 71 IU/L (ref 44–121)
BUN/Creatinine Ratio: 20 (ref 9–23)
BUN: 14 mg/dL (ref 6–24)
Bilirubin Total: 0.5 mg/dL (ref 0.0–1.2)
CO2: 29 mmol/L (ref 20–29)
Calcium: 9.5 mg/dL (ref 8.7–10.2)
Chloride: 99 mmol/L (ref 96–106)
Creatinine, Ser: 0.71 mg/dL (ref 0.57–1.00)
Globulin, Total: 2.1 g/dL (ref 1.5–4.5)
Glucose: 113 mg/dL — ABNORMAL HIGH (ref 70–99)
Potassium: 4.6 mmol/L (ref 3.5–5.2)
Sodium: 139 mmol/L (ref 134–144)
Total Protein: 6.3 g/dL (ref 6.0–8.5)
eGFR: 104 mL/min/{1.73_m2} (ref >59)

## 2021-10-06 LAB — LIPID PANEL
Chol/HDL Ratio: 3.1 ratio (ref 0.0–4.4)
Cholesterol, Total: 177 mg/dL (ref 100–199)
HDL: 57 mg/dL (ref >39)
LDL Chol Calc (NIH): 107 mg/dL — ABNORMAL HIGH (ref 0–99)
Triglycerides: 71 mg/dL (ref 0–149)
VLDL Cholesterol Cal: 13 mg/dL (ref 5–40)

## 2021-10-06 LAB — CBC WITH DIFFERENTIAL/PLATELET
Basophils Absolute: 0.1 10*3/uL (ref 0.0–0.2)
Basos: 1 %
EOS (ABSOLUTE): 0.4 10*3/uL (ref 0.0–0.4)
Eos: 3 %
Hematocrit: 40.4 % (ref 34.0–46.6)
Hemoglobin: 14 g/dL (ref 11.1–15.9)
Immature Grans (Abs): 0.1 10*3/uL (ref 0.0–0.1)
Immature Granulocytes: 1 %
Lymphocytes Absolute: 2.3 10*3/uL (ref 0.7–3.1)
Lymphs: 20 %
MCH: 29.9 pg (ref 26.6–33.0)
MCHC: 34.7 g/dL (ref 31.5–35.7)
MCV: 86 fL (ref 79–97)
Monocytes Absolute: 0.8 10*3/uL (ref 0.1–0.9)
Monocytes: 7 %
Neutrophils Absolute: 8 10*3/uL — ABNORMAL HIGH (ref 1.4–7.0)
Neutrophils: 68 %
Platelets: 491 10*3/uL — ABNORMAL HIGH (ref 150–450)
RBC: 4.69 x10E6/uL (ref 3.77–5.28)
RDW: 13.6 % (ref 11.7–15.4)
WBC: 11.7 10*3/uL — ABNORMAL HIGH (ref 3.4–10.8)

## 2021-10-06 LAB — HEMOGLOBIN A1C
Est. average glucose Bld gHb Est-mCnc: 146 mg/dL
Hgb A1c MFr Bld: 6.7 % — ABNORMAL HIGH (ref 4.8–5.6)

## 2021-10-11 ENCOUNTER — Inpatient Hospital Stay: Payer: 59 | Attending: Oncology

## 2021-10-11 ENCOUNTER — Other Ambulatory Visit: Payer: Self-pay

## 2021-10-11 DIAGNOSIS — D509 Iron deficiency anemia, unspecified: Secondary | ICD-10-CM | POA: Insufficient documentation

## 2021-10-11 DIAGNOSIS — F17210 Nicotine dependence, cigarettes, uncomplicated: Secondary | ICD-10-CM | POA: Diagnosis not present

## 2021-10-11 DIAGNOSIS — D75839 Thrombocytosis, unspecified: Secondary | ICD-10-CM | POA: Insufficient documentation

## 2021-10-11 LAB — CBC WITH DIFFERENTIAL/PLATELET
Abs Immature Granulocytes: 0.09 10*3/uL — ABNORMAL HIGH (ref 0.00–0.07)
Basophils Absolute: 0.2 10*3/uL — ABNORMAL HIGH (ref 0.0–0.1)
Basophils Relative: 1 %
Eosinophils Absolute: 0.3 10*3/uL (ref 0.0–0.5)
Eosinophils Relative: 2 %
HCT: 43.6 % (ref 36.0–46.0)
Hemoglobin: 14.5 g/dL (ref 12.0–15.0)
Immature Granulocytes: 1 %
Lymphocytes Relative: 23 %
Lymphs Abs: 3 10*3/uL (ref 0.7–4.0)
MCH: 29.8 pg (ref 26.0–34.0)
MCHC: 33.3 g/dL (ref 30.0–36.0)
MCV: 89.5 fL (ref 80.0–100.0)
Monocytes Absolute: 0.9 10*3/uL (ref 0.1–1.0)
Monocytes Relative: 7 %
Neutro Abs: 8.4 10*3/uL — ABNORMAL HIGH (ref 1.7–7.7)
Neutrophils Relative %: 66 %
Platelets: 530 10*3/uL — ABNORMAL HIGH (ref 150–400)
RBC: 4.87 MIL/uL (ref 3.87–5.11)
RDW: 14.1 % (ref 11.5–15.5)
WBC: 12.9 10*3/uL — ABNORMAL HIGH (ref 4.0–10.5)
nRBC: 0 % (ref 0.0–0.2)

## 2021-10-11 LAB — IRON AND TIBC
Iron: 50 ug/dL (ref 28–170)
Saturation Ratios: 11 % (ref 10.4–31.8)
TIBC: 461 ug/dL — ABNORMAL HIGH (ref 250–450)
UIBC: 411 ug/dL

## 2021-10-11 LAB — FERRITIN: Ferritin: 25 ng/mL (ref 11–307)

## 2021-10-12 ENCOUNTER — Ambulatory Visit: Payer: 59

## 2021-10-12 ENCOUNTER — Ambulatory Visit: Payer: 59 | Admitting: Oncology

## 2021-10-12 NOTE — Progress Notes (Signed)
[place]  Telephone:([phone] [phone]  HEMATOLOGY-ONCOLOGY TELEMEDICINE VISIT PROGRESS NOTE  ID: Erin Zamora OB: January 30, 1971  MR#: [id number]  XVQ#:[id number]  Patient Care Team: Teena Dunk, NP as PCP - General (Nurse Practitioner) Lloyd Huger, MD as Consulting Physician (Oncology)  I connected with Erin Zamora on 10/15/21 at  2:15 PM EDT by video enabled telemedicine visit and verified that I am speaking with the correct person using two identifiers.   I discussed the limitations, risks, security and privacy concerns of performing an evaluation and management service by telemedicine and the availability of in-person appointments. I also discussed with the patient that there may be a patient responsible charge related to this service. The patient expressed understanding and agreed to proceed.   Other persons participating in the visit and their role in the encounter: Patient, MD.  Patient's location: Home. Provider's location: Clinic.  CHIEF COMPLAINT: Thrombocytosis, iron deficiency anemia.  INTERVAL HISTORY: Patient agreed to video assisted telemedicine visit for further evaluation and discussion of her laboratory results.  She continues to feel well and is asymptomatic.  She does not complain of any weakness or fatigue today.  She has no neurologic complaints.  She denies any recent fevers or illnesses.  She has a good appetite and denies weight loss.  She has no chest pain, shortness of breath, cough, or hemoptysis.  She denies any nausea, vomiting, constipation, or diarrhea.  She denies any melena or hematochezia.  She has no urinary complaints.  Patient offers no specific complaints today.  REVIEW OF SYSTEMS:   Review of Systems  Constitutional: Negative.  Negative for fever, malaise/fatigue and weight loss.  Respiratory: Negative.  Negative for cough, hemoptysis and shortness of breath.   Cardiovascular:  Negative.  Negative for chest pain and leg swelling.  Gastrointestinal: Negative.  Negative for abdominal pain.  Genitourinary: Negative.  Negative for dysuria.  Musculoskeletal: Negative.  Negative for back pain.  Skin: Negative.  Negative for rash.  Neurological: Negative.  Negative for dizziness, focal weakness, weakness and headaches.  Psychiatric/Behavioral: Negative.  The patient is not nervous/anxious.     As per HPI. Otherwise, a complete review of systems is negative.  PAST MEDICAL HISTORY: Past Medical History:  Diagnosis Date   Allergy    seasonal   Anemia    Anxiety    CAP (community acquired pneumonia) 10/2018   Cough 10/2018   Hyperlipidemia 10/2018   Hypokalemia    Insomnia 10/2018   Microcytic anemia    Skin lesions 10/2019   Thrombocytosis    Vitamin D deficiency 10/2018    PAST SURGICAL HISTORY: Past Surgical History:  Procedure Laterality Date   APPENDECTOMY  1991   CHOLECYSTECTOMY  2003   COLONOSCOPY WITH PROPOFOL N/A 05/18/2021   Procedure: COLONOSCOPY WITH PROPOFOL;  Surgeon: Jonathon Bellows, MD;  Location: [facility] ENDOSCOPY;  Service: Gastroenterology;  Laterality: N/A;   TUBAL LIGATION  2003    FAMILY HISTORY: Family History  Problem Relation Age of Onset   Alcohol abuse Mother    COPD Mother    Hypertension Mother    Alcohol abuse Father    Diabetes Father    Drug abuse Father    Heart disease Father    Hypertension Father    Kidney disease Father    Arthritis Maternal Grandmother    Arthritis Maternal Grandfather    Melanoma Maternal Grandfather    Cancer Paternal Grandmother    Diabetes Paternal Grandmother    Heart disease  Paternal Grandmother    Diabetes Paternal Grandfather    Heart disease Paternal Grandfather    Kidney disease Paternal Grandfather    Stroke Paternal Grandfather     ADVANCED DIRECTIVES (Y/N):  N  HEALTH MAINTENANCE: Social History   Tobacco Use   Smoking status: Every Day    Packs/day: 0.50    Types:  Cigarettes    Last attempt to quit: 10/01/2018    Years since quitting: 3.0   Smokeless tobacco: Never   Tobacco comments:    5-7 ciggs per day  Vaping Use   Vaping Use: Never used  Substance Use Topics   Alcohol use: Yes    Alcohol/week: 0.0 standard drinks of alcohol    Comment: rare   Drug use: No     Colonoscopy:  PAP:  Bone density:  Lipid panel:  Allergies  Allergen Reactions   Codeine Itching and Nausea And Vomiting   Iodine     Unknown  Patient states that her mother had seizure reaction to Iodine.     Current Outpatient Medications  Medication Sig Dispense Refill   gabapentin (NEURONTIN) 300 MG capsule Take 1 capsule (300 mg total) by mouth 3 (three) times daily. 90 capsule 3   lisinopril (ZESTRIL) 40 MG tablet Take 1 tablet (40 mg total) by mouth daily. 90 tablet 3   potassium chloride SA (KLOR-CON) 20 MEQ tablet Take 1 tablet (20 mEq total) by mouth daily. 30 tablet 1   sertraline (ZOLOFT) 100 MG tablet Take 1 tablet (100 mg total) by mouth daily. 90 tablet 1   Vitamin D, Ergocalciferol, (DRISDOL) 1.25 MG (50000 UNIT) CAPS capsule Take 1 capsule (50,000 Units total) by mouth every 7 (seven) days. 5 capsule 6   acetaminophen (TYLENOL) 325 MG tablet Take 2 tablets (650 mg total) by mouth every 6 (six) hours as needed. (Patient not taking: Reported on 10/05/2021) 36 tablet 0   No current facility-administered medications for this visit.    OBJECTIVE: There were no vitals filed for this visit.    There is no height or weight on file to calculate BMI.    ECOG FS:0 - Asymptomatic  General: Well-developed, well-nourished, no acute distress. HEENT: Normocephalic. Neuro: Alert, answering all questions appropriately. Cranial nerves grossly intact. Psych: Normal affect.   LAB RESULTS:  Lab Results  Component Value Date   NA 139 10/05/2021   K 4.6 10/05/2021   CL 99 10/05/2021   CO2 29 10/05/2021   GLUCOSE 113 (H) 10/05/2021   BUN 14 10/05/2021   CREATININE 0.71  10/05/2021   CALCIUM 9.5 10/05/2021   PROT 6.3 10/05/2021   ALBUMIN 4.2 10/05/2021   AST 9 10/05/2021   ALT 18 10/05/2021   ALKPHOS 71 10/05/2021   BILITOT 0.5 10/05/2021   GFRNONAA >60 07/16/2020   GFRAA >60 10/15/2018    Lab Results  Component Value Date   WBC 12.9 (H) 10/11/2021   NEUTROABS 8.4 (H) 10/11/2021   HGB 14.5 10/11/2021   HCT 43.6 10/11/2021   MCV 89.5 10/11/2021   PLT 530 (H) 10/11/2021   Lab Results  Component Value Date   IRON 50 10/11/2021   TIBC 461 (H) 10/11/2021   IRONPCTSAT 11 10/11/2021   Lab Results  Component Value Date   FERRITIN 25 10/11/2021     STUDIES: No results found.  ASSESSMENT: Thrombocytosis, iron deficiency anemia.  PLAN:  1.  Iron deficiency anemia: Patient's hemoglobin and iron stores continue to be within normal limits.  She does not require  additional IV Venofer today.  She last received treatment on February 09, 2021.  No intervention is needed at this time.  Return to clinic in 4 months with repeat laboratory work and video assisted telemedicine visit.  If patient's hemoglobin and iron stores continue to be within normal limits, she likely can be discharged from clinic.  2. Thrombocytosis: Chronic and unchanged.  Previously, JAK-2 mutation was negative. 3. Back pain: Patient does not complain of this today.  I provided 20 minutes of face-to-face video visit time during this encounter which included chart review, counseling, and coordination of care as documented above.    Patient expressed understanding that she can return to clinic at any time if she has any questions, concerns, or complaints.   Lloyd Huger, MD 10/15/2021 10:36 AM

## 2021-10-13 ENCOUNTER — Telehealth: Payer: Self-pay | Admitting: *Deleted

## 2021-10-13 NOTE — Telephone Encounter (Signed)
Called patient to let her know that based on lab results, IV iron is not need at this time. MD visit for tomorrow can be changed to virtual, patient is agreeable and appts updated. Infusion appt cancelled and MyChart visit scheduled for 2:15. Patient verbalized understanding.

## 2021-10-14 ENCOUNTER — Inpatient Hospital Stay: Payer: 59

## 2021-10-14 ENCOUNTER — Encounter: Payer: Self-pay | Admitting: Oncology

## 2021-10-14 ENCOUNTER — Inpatient Hospital Stay (HOSPITAL_BASED_OUTPATIENT_CLINIC_OR_DEPARTMENT_OTHER): Payer: 59 | Admitting: Oncology

## 2021-10-14 DIAGNOSIS — D509 Iron deficiency anemia, unspecified: Secondary | ICD-10-CM | POA: Diagnosis not present

## 2021-10-14 NOTE — Progress Notes (Signed)
Patient denies any concerns today.  

## 2021-10-15 ENCOUNTER — Encounter: Payer: Self-pay | Admitting: Oncology

## 2021-12-02 NOTE — Telephone Encounter (Signed)
noted 

## 2021-12-13 ENCOUNTER — Other Ambulatory Visit: Payer: Self-pay

## 2021-12-13 ENCOUNTER — Other Ambulatory Visit: Payer: Self-pay | Admitting: Student

## 2021-12-13 DIAGNOSIS — M5416 Radiculopathy, lumbar region: Secondary | ICD-10-CM

## 2021-12-28 ENCOUNTER — Ambulatory Visit
Admission: RE | Admit: 2021-12-28 | Discharge: 2021-12-28 | Disposition: A | Discharge status: Home / Self Care | Payer: 59 | Source: Ambulatory Visit | Attending: Student | Admitting: Student

## 2021-12-28 DIAGNOSIS — M5416 Radiculopathy, lumbar region: Secondary | ICD-10-CM

## 2022-01-13 NOTE — Progress Notes (Unsigned)
Office Visit Note  Patient: Erin Zamora             Date of Birth: 11-21-1970           MRN: [id number]             PCP: Teena Dunk, NP Referring: Teena Dunk, NP Visit Date: 01/14/2022 Occupation: '@GUAROCC'$ @  Subjective:  No chief complaint on file.   History of Present Illness: Erin Zamora is a 51 y.o. female here for evaluation of joint pain in multiple areas.***   Activities of Daily Living:  Patient reports morning stiffness for *** {minute/hour:19697}.   Patient {ACTIONS;DENIES/REPORTS:21021675::"Denies"} nocturnal pain.  Difficulty dressing/grooming: {ACTIONS;DENIES/REPORTS:21021675::"Denies"} Difficulty climbing stairs: {ACTIONS;DENIES/REPORTS:21021675::"Denies"} Difficulty getting out of chair: {ACTIONS;DENIES/REPORTS:21021675::"Denies"} Difficulty using hands for taps, buttons, cutlery, and/or writing: {ACTIONS;DENIES/REPORTS:21021675::"Denies"}  No Rheumatology ROS completed.   PMFS History:  Patient Active Problem List   Diagnosis Date Noted   Recurrent major depressive disorder, in full remission ([place]) 06/11/2020   Protrusion of lumbar intervertebral disc 03/25/2020   Spinal stenosis of lumbar region 03/25/2020   Skin lesions, generalized 12/02/2019   Recurrent major depressive disorder, in partial remission ([place]) 09/23/2019   MDD (major depressive disorder), recurrent episode, moderate ([place]) 07/17/2019   Generalized anxiety disorder 07/17/2019   Paranoia ([place]) 05/26/2019   Chronic left-sided low back pain with left-sided sciatica 01/01/2019   Numbness and tingling of left lower extremity 01/01/2019   Iron deficiency anemia 11/09/2018   Thrombocytosis 11/03/2018   COPD with chronic bronchitis and emphysema ([place]) 10/29/2018   Cough 10/23/2018   Chest congestion 10/23/2018   Anxiety 10/23/2018   Anemia 10/23/2018   RLL pneumonia 10/13/2018   Elevated blood pressure reading 10/13/2018   Obesity (BMI 30.0-34.9) 10/13/2018    Vitamin D deficiency 10/2018    Past Medical History:  Diagnosis Date   Allergy    seasonal   Anemia    Anxiety    CAP (community acquired pneumonia) 10/2018   Cough 10/2018   Hyperlipidemia 10/2018   Hypokalemia    Insomnia 10/2018   Microcytic anemia    Skin lesions 10/2019   Thrombocytosis    Vitamin D deficiency 10/2018    Family History  Problem Relation Age of Onset   Alcohol abuse Mother    COPD Mother    Hypertension Mother    Alcohol abuse Father    Diabetes Father    Drug abuse Father    Heart disease Father    Hypertension Father    Kidney disease Father    Arthritis Maternal Grandmother    Arthritis Maternal Grandfather    Melanoma Maternal Grandfather    Cancer Paternal Grandmother    Diabetes Paternal Grandmother    Heart disease Paternal Grandmother    Diabetes Paternal Grandfather    Heart disease Paternal Grandfather    Kidney disease Paternal Grandfather    Stroke Paternal Grandfather    Past Surgical History:  Procedure Laterality Date   APPENDECTOMY  1991   CHOLECYSTECTOMY  2003   COLONOSCOPY WITH PROPOFOL N/A 05/18/2021   Procedure: COLONOSCOPY WITH PROPOFOL;  Surgeon: Jonathon Bellows, MD;  Location: [facility] ENDOSCOPY;  Service: Gastroenterology;  Laterality: N/A;   TUBAL LIGATION  2003   Social History   Social History Narrative   50 year old and 88 y/o daughters   53 year old granddaughter - takes care of her in the afternoon after school   Significant other - 65 years   Work: Web designer  There is no immunization history on file for this patient.   Objective: Vital Signs: There were no vitals taken for this visit.   Physical Exam   Musculoskeletal Exam: ***  CDAI Exam: CDAI Score: -- Patient Global: --; Provider Global: -- Swollen: --; Tender: -- Joint Exam 01/14/2022   No joint exam has been documented for this visit   There is currently no information documented on the homunculus. Go to the Rheumatology activity  and complete the homunculus joint exam.  Investigation: No additional findings.  Imaging: MR LUMBAR SPINE WO CONTRAST  Result Date: 12/29/2021 CLINICAL DATA:  Lower back pain radiating into left hip EXAM: MRI LUMBAR SPINE WITHOUT CONTRAST TECHNIQUE: Multiplanar, multisequence MR imaging of the lumbar spine was performed. No intravenous contrast was administered. COMPARISON:  Lumbar spine MRI 03/23/2020 FINDINGS: Segmentation:  Standard. Alignment:  Slight levoconvex curvature.  No significant listhesis. Vertebrae: No evidence of acute fracture, discitis, or aggressive osseous lesion. Conus medullaris and cauda equina: Conus extends to the T12-L1 level. Conus and cauda equina appear normal. Paraspinal and other soft tissues: There are bilateral cysts, with hemorrhagic or proteinaceous cyst on the left which is slightly increased in size in comparison prior exam. No follow-up imaging is recommended. Disc levels: T11-T12: No significant spinal canal or neural foraminal narrowing. T12-L1: Left paracentral disc protrusion without significant stenosis, unchanged from prior exam. L1-L2: Similar central/left paracentral disc extrusion without significant stenosis. L2-L3: Left foraminal and extraforaminal disc protrusion results in moderate left neural foraminal stenosis and contact with the exiting left L2 nerve root, similar to prior exam. No spinal canal stenosis. L3-L4: Left foraminal/extraforaminal disc protrusion results in mild left neural foraminal stenosis and contact with the extraforaminal L3 nerve root. L4-L5: Disc bulging with focal right paracentral disc protrusion, ligament flavum hypertrophy mild facet arthropathy. Mild spinal canal stenosis, moderate bilateral lateral recess stenosis, mild-to-moderate right and mild left neural foraminal stenosis. There is potential for descending L5 nerve root impingement in the lateral recesses, similar to prior exam. L5-S1: Disc height loss with asymmetric left  disc bulging with superimposed left paracentral disc protrusion results in impingement of the descending left S1 nerve root (axial T2 image 34). No significant neural foraminal stenosis. IMPRESSION: Potential exiting left L2 nerve root impingement at L2-L3 related to a foraminal/extraforaminal disc protrusion, similar to prior exam. Bilateral lateral recess stenosis at L4-L5, with potential for impingement of the descending L5 nerve roots, similar to prior exam. Progressive disc height loss with new asymmetric left disc bulging and left paracentral disc protrusion resulting in impingement of the descending left S1 nerve root. Unchanged disc herniations at T12-L1 and L1-L2 without significant stenosis. Electronically Signed   By: Maurine Simmering M.D.   On: 12/29/2021 10:50    Recent Labs: Lab Results  Component Value Date   WBC 12.9 (H) 10/11/2021   HGB 14.5 10/11/2021   PLT 530 (H) 10/11/2021   NA 139 10/05/2021   K 4.6 10/05/2021   CL 99 10/05/2021   CO2 29 10/05/2021   GLUCOSE 113 (H) 10/05/2021   BUN 14 10/05/2021   CREATININE 0.71 10/05/2021   BILITOT 0.5 10/05/2021   ALKPHOS 71 10/05/2021   AST 9 10/05/2021   ALT 18 10/05/2021   PROT 6.3 10/05/2021   ALBUMIN 4.2 10/05/2021   CALCIUM 9.5 10/05/2021   GFRAA >60 10/15/2018    Speciality Comments: No specialty comments available.  Procedures:  No procedures performed Allergies: Codeine and Iodine   Assessment / Plan:  Visit Diagnoses: No diagnosis found.  Orders: No orders of the defined types were placed in this encounter.  No orders of the defined types were placed in this encounter.   Face-to-face time spent with patient was *** minutes. Greater than 50% of time was spent in counseling and coordination of care.  Follow-Up Instructions: No follow-ups on file.   Collier Salina, MD  Note - This record has been created using [employer].  Chart creation errors have been sought, but may not always  have been  located. Such creation errors do not reflect on  the standard of medical care.

## 2022-01-14 ENCOUNTER — Ambulatory Visit (INDEPENDENT_AMBULATORY_CARE_PROVIDER_SITE_OTHER): Payer: 59

## 2022-01-14 ENCOUNTER — Ambulatory Visit: Payer: 59 | Attending: Internal Medicine | Admitting: Internal Medicine

## 2022-01-14 ENCOUNTER — Encounter: Payer: Self-pay | Admitting: Internal Medicine

## 2022-01-14 VITALS — BP 146/96 | HR 70 | Resp 15 | Ht 65.0 in | Wt 243.8 lb

## 2022-01-14 DIAGNOSIS — G8929 Other chronic pain: Secondary | ICD-10-CM

## 2022-01-14 DIAGNOSIS — M79641 Pain in right hand: Secondary | ICD-10-CM

## 2022-01-14 DIAGNOSIS — L732 Hidradenitis suppurativa: Secondary | ICD-10-CM | POA: Diagnosis not present

## 2022-01-14 DIAGNOSIS — M79642 Pain in left hand: Secondary | ICD-10-CM

## 2022-01-14 DIAGNOSIS — M5442 Lumbago with sciatica, left side: Secondary | ICD-10-CM | POA: Diagnosis not present

## 2022-01-14 DIAGNOSIS — M797 Fibromyalgia: Secondary | ICD-10-CM | POA: Diagnosis not present

## 2022-01-14 NOTE — Patient Instructions (Signed)
I recommend checking out the [employer] patient-centered guide for fibromyalgia and chronic pain management: [email or web address].med.[employer].edu   

## 2022-01-15 LAB — RHEUMATOID FACTOR: Rheumatoid fact SerPl-aCnc: <14 IU/mL (ref <14)

## 2022-01-15 LAB — SEDIMENTATION RATE: Sed Rate: 2 mm/h (ref 0–20)

## 2022-01-15 LAB — CYCLIC CITRUL PEPTIDE ANTIBODY, IGG: Cyclic Citrullin Peptide Ab: <16 UNITS

## 2022-01-20 ENCOUNTER — Other Ambulatory Visit: Payer: Self-pay | Admitting: Neurosurgery

## 2022-01-21 NOTE — Progress Notes (Signed)
Okay to cancel patient's scheduled f/u on 10/9.  FYI-I spoke with Ms. Fosberg lab testing at our clinic visit last week was negative for rheumatoid arthritis antibody markers or sedimentation rate.  X-ray of bilateral hands shows some distal finger joint degenerative appearing changes there is no erosive disease or demineralization.  So symptoms may be combination of myofascial pain issues and early osteoarthritis but if she does have unrecognized seronegative arthritis problem I do expect this to improve if going on biologic treatment for hidradenitis.  I think we can just change to follow-up as needed.

## 2022-02-07 ENCOUNTER — Ambulatory Visit: Payer: 59 | Admitting: Internal Medicine

## 2022-02-08 NOTE — Progress Notes (Signed)
Surgical Instructions    Your procedure is scheduled on Wednesday, 02/16/22.  Report to [facility] Main Entrance "A" at 6:30 A.M., then check in with the Admitting office.  Call this number if you have problems the morning of surgery:  [phone]   If you have any questions prior to your surgery date call [phone]: Open Monday-Friday 8am-4pm If you experience any cold or flu symptoms such as cough, fever, chills, shortness of breath, etc. between now and your scheduled surgery, please notify us at the above number     Remember:  Do not eat or drink after midnight the night before your surgery     Take these medicines the morning of surgery with A SIP OF WATER:  sertraline (ZOLOFT)  IF NEEDED: albuterol (VENTOLIN HFA) inhaler- bring with you the day of surgery  As of today, STOP taking any Aspirin (unless otherwise instructed by your surgeon) Aleve, Naproxen, Ibuprofen, Motrin, Advil, Goody's, BC's, all herbal medications, fish oil, and all vitamins.           Do not wear jewelry or makeup. Do not wear lotions, powders, perfumes or deodorant. Do not shave 48 hours prior to surgery.   Do not bring valuables to the hospital. Do not wear nail polish, gel polish, artificial nails, or any other type of covering on natural nails (fingers and toes) If you have artificial nails or gel coating that need to be removed by a nail salon, please have this removed prior to surgery. Artificial nails or gel coating may interfere with anesthesia's ability to adequately monitor your vital signs.  [facility] is not responsible for any belongings or valuables.    Do NOT Smoke (Tobacco/Vaping)  24 hours prior to your procedure  If you use a CPAP at night, you may bring your mask for your overnight stay.   Contacts, glasses, hearing aids, dentures or partials may not be worn into surgery, please bring cases for these belongings   For patients admitted to the hospital, discharge time will be  determined by your treatment team.   Patients discharged the day of surgery will not be allowed to drive home, and someone needs to stay with them for 24 hours.   SURGICAL WAITING ROOM VISITATION Patients having surgery or a procedure may have no more than 2 support people in the waiting area - these visitors may rotate.   Children under the age of 55 must have an adult with them who is not the patient. If the patient needs to stay at the hospital during part of their recovery, the visitor guidelines for inpatient rooms apply. Pre-op nurse will coordinate an appropriate time for 1 support person to accompany patient in pre-op.  This support person may not rotate.   Please refer to the [facility] website for the visitor guidelines for Inpatients (after your surgery is over and you are in a regular room).    Special instructions:    Oral Hygiene is also important to reduce your risk of infection.  Remember - BRUSH YOUR TEETH THE MORNING OF SURGERY WITH YOUR REGULAR TOOTHPASTE   [facility]- Preparing For Surgery  Before surgery, you can play an important role. Because skin is not sterile, your skin needs to be as free of germs as possible. You can reduce the number of germs on your skin by washing with CHG (chlorahexidine gluconate) Soap before surgery.  CHG is an antiseptic cleaner which kills germs and bonds with the skin to continue killing germs even  after washing.     Please do not use if you have an allergy to CHG or antibacterial soaps. If your skin becomes reddened/irritated stop using the CHG.  Do not shave (including legs and underarms) for at least 48 hours prior to first CHG shower. It is OK to shave your face.  Please follow these instructions carefully.     Shower the NIGHT BEFORE SURGERY and the MORNING OF SURGERY with CHG Soap.   If you chose to wash your hair, wash your hair first as usual with your normal shampoo. After you shampoo, rinse your hair and body thoroughly  to remove the shampoo.  Then [employer] and genitals (private parts) with your normal soap and rinse thoroughly to remove soap.  After that Use CHG Soap as you would any other liquid soap. You can apply CHG directly to the skin and wash gently with a scrungie or a clean washcloth.   Apply the CHG Soap to your body ONLY FROM THE NECK DOWN.  Do not use on open wounds or open sores. Avoid contact with your eyes, ears, mouth and genitals (private parts). Wash Face and genitals (private parts)  with your normal soap.   Wash thoroughly, paying special attention to the area where your surgery will be performed.  Thoroughly rinse your body with warm water from the neck down.  DO NOT shower/wash with your normal soap after using and rinsing off the CHG Soap.  Pat yourself dry with a CLEAN TOWEL.  Wear CLEAN PAJAMAS to bed the night before surgery  Place CLEAN SHEETS on your bed the night before your surgery  DO NOT SLEEP WITH PETS.   Day of Surgery: Take a shower with CHG soap. Wear Clean/Comfortable clothing the morning of surgery Do not apply any deodorants/lotions.   Remember to brush your teeth WITH YOUR REGULAR TOOTHPASTE.    If you received a COVID test during your pre-op visit, it is requested that you wear a mask when out in public, stay away from anyone that may not be feeling well, and notify your surgeon if you develop symptoms. If you have been in contact with anyone that has tested positive in the last 10 days, please notify your surgeon.    Please read over the following fact sheets that you were given.

## 2022-02-09 ENCOUNTER — Encounter (HOSPITAL_COMMUNITY): Payer: Self-pay

## 2022-02-09 ENCOUNTER — Other Ambulatory Visit: Payer: Self-pay

## 2022-02-09 ENCOUNTER — Encounter (HOSPITAL_COMMUNITY)
Admission: RE | Admit: 2022-02-09 | Discharge: 2022-02-09 | Disposition: A | Discharge status: Home / Self Care | Payer: 59 | Source: Ambulatory Visit | Attending: Neurosurgery | Admitting: Neurosurgery

## 2022-02-09 VITALS — BP 143/89 | HR 85 | Temp 98.4°F | Resp 17 | Ht 65.0 in | Wt 239.4 lb

## 2022-02-09 DIAGNOSIS — R030 Elevated blood-pressure reading, without diagnosis of hypertension: Secondary | ICD-10-CM | POA: Insufficient documentation

## 2022-02-09 DIAGNOSIS — D649 Anemia, unspecified: Secondary | ICD-10-CM | POA: Diagnosis not present

## 2022-02-09 DIAGNOSIS — Z01818 Encounter for other preprocedural examination: Secondary | ICD-10-CM | POA: Insufficient documentation

## 2022-02-09 HISTORY — DX: Dyspnea, unspecified: R06.00

## 2022-02-09 HISTORY — DX: Prediabetes: R73.03

## 2022-02-09 HISTORY — DX: Fibromyalgia: M79.7

## 2022-02-09 HISTORY — DX: Chronic obstructive pulmonary disease, unspecified: J44.9

## 2022-02-09 LAB — BASIC METABOLIC PANEL
Anion gap: 8 (ref 5–15)
BUN: 11 mg/dL (ref 6–20)
CO2: 30 mmol/L (ref 22–32)
Calcium: 9 mg/dL (ref 8.9–10.3)
Chloride: 102 mmol/L (ref 98–111)
Creatinine, Ser: 0.88 mg/dL (ref 0.44–1.00)
GFR, Estimated: >60 mL/min (ref >60)
Glucose, Bld: 139 mg/dL — ABNORMAL HIGH (ref 70–99)
Potassium: 3.7 mmol/L (ref 3.5–5.1)
Sodium: 140 mmol/L (ref 135–145)

## 2022-02-09 LAB — SURGICAL PCR SCREEN
MRSA, PCR: NEGATIVE
Staphylococcus aureus: NEGATIVE

## 2022-02-09 LAB — CBC
HCT: 44.4 % (ref 36.0–46.0)
Hemoglobin: 14.8 g/dL (ref 12.0–15.0)
MCH: 29.8 pg (ref 26.0–34.0)
MCHC: 33.3 g/dL (ref 30.0–36.0)
MCV: 89.3 fL (ref 80.0–100.0)
Platelets: 558 10*3/uL — ABNORMAL HIGH (ref 150–400)
RBC: 4.97 MIL/uL (ref 3.87–5.11)
RDW: 13.6 % (ref 11.5–15.5)
WBC: 11.6 10*3/uL — ABNORMAL HIGH (ref 4.0–10.5)
nRBC: 0 % (ref 0.0–0.2)

## 2022-02-09 NOTE — Progress Notes (Signed)
PCP - Bo Merino Cardiologist - denies Rheumatologist: Dr. Vernelle Emerald  PPM/ICD - denies   Chest x-ray - n/a EKG - 02/09/22 Stress Test - denies ECHO - denies Cardiac Cath - denies  Sleep Study - has been advised to receive one- high risk factors for sleep apnea  Follow your surgeon's instructions on when to stop Aspirin.  If no instructions were given by your surgeon then you will need to call the office to get those instructions.     ERAS Protcol -no   COVID TEST- no   Anesthesia review: no  Patient denies shortness of breath, fever, cough and chest pain at PAT appointment   All instructions explained to the patient, with a verbal understanding of the material. Patient agrees to go over the instructions while at home for a better understanding. Patient also instructed to self quarantine after being tested for COVID-19. The opportunity to ask questions was provided.

## 2022-02-10 NOTE — Progress Notes (Deleted)
[facility]  [phone] [phone]  HEMATOLOGY-ONCOLOGY TELEMEDICINE VISIT PROGRESS NOTE  ID: [name] OB: August 25, 1970  MR#: [id number]  XNT#:[id number]  Patient Care Team: Teena Dunk, NP as PCP - General (Nurse Practitioner) Lloyd Huger, MD as Consulting Physician (Oncology)  I connected with [name] on 02/10/22 at  2:30 PM EDT by {Blank single:19197::"video enabled telemedicine visit","telephone visit"} and verified that I am speaking with the correct person using two identifiers.   I discussed the limitations, risks, security and privacy concerns of performing an evaluation and management service by telemedicine and the availability of in-person appointments. I also discussed with the patient that there may be a patient responsible charge related to this service. The patient expressed understanding and agreed to proceed.   Other persons participating in the visit and their role in the encounter: Patient, MD.  Patient's location: Home. Provider's location: Clinic.  CHIEF COMPLAINT: Thrombocytosis, iron deficiency anemia.  INTERVAL HISTORY: Patient agreed to video assisted telemedicine visit for further evaluation and discussion of her laboratory results.  She continues to feel well and is asymptomatic.  She does not complain of any weakness or fatigue today.  She has no neurologic complaints.  She denies any recent fevers or illnesses.  She has a good appetite and denies weight loss.  She has no chest pain, shortness of breath, cough, or hemoptysis.  She denies any nausea, vomiting, constipation, or diarrhea.  She denies any melena or hematochezia.  She has no urinary complaints.  Patient offers no specific complaints today.  REVIEW OF SYSTEMS:   Review of Systems  Constitutional: Negative.  Negative for fever, malaise/fatigue and weight loss.  Respiratory: Negative.  Negative for cough, hemoptysis and  shortness of breath.   Cardiovascular: Negative.  Negative for chest pain and leg swelling.  Gastrointestinal: Negative.  Negative for abdominal pain.  Genitourinary: Negative.  Negative for dysuria.  Musculoskeletal: Negative.  Negative for back pain.  Skin: Negative.  Negative for rash.  Neurological: Negative.  Negative for dizziness, focal weakness, weakness and headaches.  Psychiatric/Behavioral: Negative.  The patient is not nervous/anxious.     As per HPI. Otherwise, a complete review of systems is negative.  PAST MEDICAL HISTORY: Past Medical History:  Diagnosis Date   Allergy    seasonal   Anemia    Anxiety    CAP (community acquired pneumonia) 10/2018   COPD (chronic obstructive pulmonary disease) ([place])    Cough 10/2018   Dyspnea    Fibromyalgia    Hyperlipidemia 10/2018   Hypertension    Hypokalemia    Insomnia 10/2018   Microcytic anemia    Pre-diabetes    Skin lesions 10/2019   Thrombocytosis    Vitamin D deficiency 10/2018    PAST SURGICAL HISTORY: Past Surgical History:  Procedure Laterality Date   APPENDECTOMY  05/02/1989   CHOLECYSTECTOMY  05/02/2001   COLONOSCOPY WITH PROPOFOL N/A 05/18/2021   Procedure: COLONOSCOPY WITH PROPOFOL;  Surgeon: Jonathon Bellows, MD;  Location: [facility] ENDOSCOPY;  Service: Gastroenterology;  Laterality: N/A;   TRIGGER FINGER RELEASE Right    TUBAL LIGATION  05/02/2001    FAMILY HISTORY: Family History  Problem Relation Age of Onset   Alcohol abuse Mother    COPD Mother    Hypertension Mother    Alcohol abuse Father    Diabetes Father    Drug abuse Father    Heart disease Father    Hypertension Father    Kidney disease Father  Rheum arthritis Sister    Ankylosing spondylitis Sister    Arthritis Maternal Grandmother    Arthritis Maternal Grandfather    Melanoma Maternal Grandfather    Cancer Paternal Grandmother    Diabetes Paternal Grandmother    Heart disease Paternal Grandmother    Diabetes Paternal  Grandfather    Heart disease Paternal Grandfather    Kidney disease Paternal Grandfather    Stroke Paternal Grandfather    Hypertension Daughter    Obesity Daughter     ADVANCED DIRECTIVES (Y/N):  N  HEALTH MAINTENANCE: Social History   Tobacco Use   Smoking status: Every Day    Packs/day: 0.50    Types: Cigarettes    Last attempt to quit: 10/01/2018    Years since quitting: 3.3    Passive exposure: Never   Smokeless tobacco: Never   Tobacco comments:    5-7 ciggs per day  Vaping Use   Vaping Use: Former  Substance Use Topics   Alcohol use: Yes    Alcohol/week: 0.0 standard drinks of alcohol    Comment: rare   Drug use: Yes    Types: Marijuana    Comment: daily     Colonoscopy:  PAP:  Bone density:  Lipid panel:  Allergies  Allergen Reactions   Codeine Itching and Nausea And Vomiting   Iodine     Unknown  Patient states that her mother had seizure reaction to Iodine.     Current Outpatient Medications  Medication Sig Dispense Refill   acetaminophen (TYLENOL) 325 MG tablet Take 2 tablets (650 mg total) by mouth every 6 (six) hours as needed. (Patient not taking: Reported on 02/04/2022) 36 tablet 0   albuterol (VENTOLIN HFA) 108 (90 Base) MCG/ACT inhaler Inhale 1-2 puffs into the lungs every 6 (six) hours as needed for wheezing or shortness of breath.     gabapentin (NEURONTIN) 300 MG capsule Take 1 capsule (300 mg total) by mouth 3 (three) times daily. (Patient not taking: Reported on 02/04/2022) 90 capsule 3   ibuprofen (ADVIL) 200 MG tablet Take 800 mg by mouth every 4 (four) hours as needed for moderate pain.     lisinopril (ZESTRIL) 40 MG tablet Take 1 tablet (40 mg total) by mouth daily. (Patient not taking: Reported on 02/04/2022) 90 tablet 3   potassium chloride SA (KLOR-CON) 20 MEQ tablet Take 1 tablet (20 mEq total) by mouth daily. (Patient not taking: Reported on 02/04/2022) 30 tablet 1   sertraline (ZOLOFT) 100 MG tablet Take 1 tablet (100 mg total) by mouth  daily. 90 tablet 1   Vitamin D, Ergocalciferol, (DRISDOL) 1.25 MG (50000 UNIT) CAPS capsule Take 1 capsule (50,000 Units total) by mouth every 7 (seven) days. (Patient not taking: Reported on 02/04/2022) 5 capsule 6   No current facility-administered medications for this visit.    OBJECTIVE: There were no vitals filed for this visit.    There is no height or weight on file to calculate BMI.    ECOG FS:0 - Asymptomatic  General: Well-developed, well-nourished, no acute distress. HEENT: Normocephalic. Neuro: Alert, answering all questions appropriately. Cranial nerves grossly intact. Psych: Normal affect.   LAB RESULTS:  Lab Results  Component Value Date   NA 140 02/09/2022   K 3.7 02/09/2022   CL 102 02/09/2022   CO2 30 02/09/2022   GLUCOSE 139 (H) 02/09/2022   BUN 11 02/09/2022   CREATININE 0.88 02/09/2022   CALCIUM 9.0 02/09/2022   PROT 6.3 10/05/2021   ALBUMIN 4.2 10/05/2021  AST 9 10/05/2021   ALT 18 10/05/2021   ALKPHOS 71 10/05/2021   BILITOT 0.5 10/05/2021   GFRNONAA >60 02/09/2022   GFRAA >60 10/15/2018    Lab Results  Component Value Date   WBC 11.6 (H) 02/09/2022   NEUTROABS 8.4 (H) 10/11/2021   HGB 14.8 02/09/2022   HCT 44.4 02/09/2022   MCV 89.3 02/09/2022   PLT 558 (H) 02/09/2022   Lab Results  Component Value Date   IRON 50 10/11/2021   TIBC 461 (H) 10/11/2021   IRONPCTSAT 11 10/11/2021   Lab Results  Component Value Date   FERRITIN 25 10/11/2021     STUDIES: XR Hand 2 View Left  Result Date: 01/14/2022 X-ray left hand 2 views Radiocarpal joint space appears normal.  Carpal bones and first CMC joint appear normal.  MCPs appear normal.  Irregular marginal calcifications present at PIP and DIPs mostly in the second and third finger.  No erosions seen.  Bone mineralization appears normal. Impression Early degenerative appearing distal finger joint changes  XR Hand 2 View Right  Result Date: 01/14/2022 X-ray right hand 2 views Radiocarpal  carpal joint spaces appear normal.  MCP and PIP joint spaces appear normal.  There are very small marginal calcifications at the DIP joints.  No erosions seen.  Bone mineralization appears normal. 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   ASSESSMENT: Thrombocytosis, iron deficiency anemia.  PLAN:  1.  Iron deficiency anemia: Patient's hemoglobin and iron stores continue to be within normal limits.  She does not require additional IV Venofer today.  She last received treatment on February 09, 2021.  No intervention is needed at this time.  Return to clinic in 4 months with repeat laboratory work and video assisted telemedicine visit.  If patient's hemoglobin and iron stores continue to be within normal limits, she likely can be discharged from clinic.  2. Thrombocytosis: Chronic and unchanged.  Previously, JAK-2 mutation was negative. 3. Back pain: Patient does not complain of this today.  I provided *** minutes of {Blank single:19197::"face-to-face video visit time","non face-to-face telephone visit time"} during this encounter which included chart review, counseling, and coordination of care as documented above.   Patient expressed understanding that she can return to clinic at any time if she has any questions, concerns, or complaints.   Lloyd Huger, MD 02/10/2022 4:07 PM

## 2022-02-16 ENCOUNTER — Encounter (HOSPITAL_COMMUNITY): Payer: Self-pay | Admitting: Neurosurgery

## 2022-02-16 ENCOUNTER — Ambulatory Visit (HOSPITAL_COMMUNITY): Payer: 59 | Admitting: Anesthesiology

## 2022-02-16 ENCOUNTER — Inpatient Hospital Stay (HOSPITAL_COMMUNITY)
Admission: RE | Admit: 2022-02-16 | Discharge: 2022-02-19 | DRG: 519 | Disposition: A | Discharge status: Home / Self Care | Payer: 59 | Attending: Neurosurgery | Admitting: Neurosurgery

## 2022-02-16 ENCOUNTER — Other Ambulatory Visit: Payer: 59

## 2022-02-16 ENCOUNTER — Ambulatory Visit (HOSPITAL_BASED_OUTPATIENT_CLINIC_OR_DEPARTMENT_OTHER): Payer: 59 | Admitting: Anesthesiology

## 2022-02-16 ENCOUNTER — Ambulatory Visit (HOSPITAL_COMMUNITY): Payer: 59

## 2022-02-16 ENCOUNTER — Ambulatory Visit (HOSPITAL_COMMUNITY)
Admission: RE | Disposition: A | Discharge status: Home / Self Care | Payer: Self-pay | Source: Home / Self Care | Attending: Neurosurgery

## 2022-02-16 ENCOUNTER — Other Ambulatory Visit: Payer: Self-pay

## 2022-02-16 DIAGNOSIS — G9761 Postprocedural hematoma of a nervous system organ or structure following a nervous system procedure: Secondary | ICD-10-CM | POA: Diagnosis not present

## 2022-02-16 DIAGNOSIS — J449 Chronic obstructive pulmonary disease, unspecified: Secondary | ICD-10-CM | POA: Diagnosis not present

## 2022-02-16 DIAGNOSIS — I10 Essential (primary) hypertension: Secondary | ICD-10-CM

## 2022-02-16 DIAGNOSIS — Z885 Allergy status to narcotic agent status: Secondary | ICD-10-CM

## 2022-02-16 DIAGNOSIS — Z6839 Body mass index (BMI) 39.0-39.9, adult: Secondary | ICD-10-CM

## 2022-02-16 DIAGNOSIS — M5126 Other intervertebral disc displacement, lumbar region: Principal | ICD-10-CM | POA: Diagnosis present

## 2022-02-16 DIAGNOSIS — F17210 Nicotine dependence, cigarettes, uncomplicated: Secondary | ICD-10-CM | POA: Diagnosis present

## 2022-02-16 DIAGNOSIS — Z79899 Other long term (current) drug therapy: Secondary | ICD-10-CM

## 2022-02-16 DIAGNOSIS — Z8249 Family history of ischemic heart disease and other diseases of the circulatory system: Secondary | ICD-10-CM

## 2022-02-16 DIAGNOSIS — M797 Fibromyalgia: Secondary | ICD-10-CM | POA: Diagnosis present

## 2022-02-16 DIAGNOSIS — M48061 Spinal stenosis, lumbar region without neurogenic claudication: Secondary | ICD-10-CM

## 2022-02-16 DIAGNOSIS — Z825 Family history of asthma and other chronic lower respiratory diseases: Secondary | ICD-10-CM

## 2022-02-16 DIAGNOSIS — M5117 Intervertebral disc disorders with radiculopathy, lumbosacral region: Principal | ICD-10-CM | POA: Diagnosis present

## 2022-02-16 DIAGNOSIS — Y834 Other reconstructive surgery as the cause of abnormal reaction of the patient, or of later complication, without mention of misadventure at the time of the procedure: Secondary | ICD-10-CM | POA: Diagnosis not present

## 2022-02-16 DIAGNOSIS — Z01818 Encounter for other preprocedural examination: Secondary | ICD-10-CM

## 2022-02-16 DIAGNOSIS — E785 Hyperlipidemia, unspecified: Secondary | ICD-10-CM | POA: Diagnosis present

## 2022-02-16 DIAGNOSIS — R7303 Prediabetes: Secondary | ICD-10-CM | POA: Diagnosis present

## 2022-02-16 HISTORY — PX: LUMBAR LAMINECTOMY/DECOMPRESSION MICRODISCECTOMY: SHX5026

## 2022-02-16 LAB — POCT PREGNANCY, URINE: Preg Test, Ur: NEGATIVE

## 2022-02-16 SURGERY — LUMBAR LAMINECTOMY/DECOMPRESSION MICRODISCECTOMY 2 LEVELS
Anesthesia: General | Site: Back | Laterality: Left

## 2022-02-16 MED ORDER — PHENYLEPHRINE HCL-NACL 20-0.9 MG/250ML-% IV SOLN
INTRAVENOUS | Status: DC | PRN
  Administered 2022-02-16: 50 ug/min via INTRAVENOUS

## 2022-02-16 MED ORDER — ACETAMINOPHEN 500 MG PO TABS: 1000.0000 mg | ORAL_TABLET | Freq: Once | ORAL | Status: DC | PRN

## 2022-02-16 MED ORDER — DEXAMETHASONE SODIUM PHOSPHATE 10 MG/ML IJ SOLN
INTRAMUSCULAR | Status: DC | PRN
  Administered 2022-02-16: 10 mg via INTRAVENOUS

## 2022-02-16 MED ORDER — ALBUTEROL SULFATE HFA 108 (90 BASE) MCG/ACT IN AERS
INHALATION_SPRAY | RESPIRATORY_TRACT | Status: DC | PRN
  Administered 2022-02-16 (×2): 2 via RESPIRATORY_TRACT

## 2022-02-16 MED ORDER — ALBUTEROL SULFATE (2.5 MG/3ML) 0.083% IN NEBU: 3.0000 mL | INHALATION_SOLUTION | Freq: Four times a day (QID) | RESPIRATORY_TRACT | Status: DC | PRN

## 2022-02-16 MED ORDER — HYDROMORPHONE HCL 1 MG/ML IJ SOLN
0.5000 mg | INTRAMUSCULAR | Status: DC | PRN
  Administered 2022-02-17 – 2022-02-19 (×5): 0.5 mg via INTRAVENOUS
  Filled 2022-02-16 (×6): qty 0.5

## 2022-02-16 MED ORDER — ALUM & MAG HYDROXIDE-SIMETH 200-200-20 MG/5ML PO SUSP: 30.0000 mL | Freq: Four times a day (QID) | ORAL | Status: DC | PRN

## 2022-02-16 MED ORDER — IPRATROPIUM-ALBUTEROL 0.5-2.5 (3) MG/3ML IN SOLN
RESPIRATORY_TRACT | Status: AC
  Filled 2022-02-16: qty 3

## 2022-02-16 MED ORDER — LIDOCAINE 2% (20 MG/ML) 5 ML SYRINGE
INTRAMUSCULAR | Status: AC
  Filled 2022-02-16: qty 5

## 2022-02-16 MED ORDER — FENTANYL CITRATE (PF) 100 MCG/2ML IJ SOLN
INTRAMUSCULAR | Status: AC
  Filled 2022-02-16: qty 2

## 2022-02-16 MED ORDER — ROCURONIUM BROMIDE 10 MG/ML (PF) SYRINGE
PREFILLED_SYRINGE | INTRAVENOUS | Status: DC | PRN
  Administered 2022-02-16: 20 mg via INTRAVENOUS
  Administered 2022-02-16: 80 mg via INTRAVENOUS

## 2022-02-16 MED ORDER — CEFAZOLIN SODIUM-DEXTROSE 2-4 GM/100ML-% IV SOLN
2.0000 g | INTRAVENOUS | Status: AC
  Administered 2022-02-16: 2 g via INTRAVENOUS
  Filled 2022-02-16: qty 100

## 2022-02-16 MED ORDER — PHENYLEPHRINE 80 MCG/ML (10ML) SYRINGE FOR IV PUSH (FOR BLOOD PRESSURE SUPPORT)
PREFILLED_SYRINGE | INTRAVENOUS | Status: DC | PRN
  Administered 2022-02-16: 160 ug via INTRAVENOUS
  Administered 2022-02-16 (×2): 80 ug via INTRAVENOUS

## 2022-02-16 MED ORDER — CYCLOBENZAPRINE HCL 10 MG PO TABS
10.0000 mg | ORAL_TABLET | Freq: Three times a day (TID) | ORAL | Status: DC | PRN
  Administered 2022-02-16 – 2022-02-19 (×6): 10 mg via ORAL
  Filled 2022-02-16 (×6): qty 1

## 2022-02-16 MED ORDER — MIDAZOLAM HCL 2 MG/2ML IJ SOLN
INTRAMUSCULAR | Status: DC | PRN
  Administered 2022-02-16: 2 mg via INTRAVENOUS

## 2022-02-16 MED ORDER — EPHEDRINE 5 MG/ML INJ
INTRAVENOUS | Status: AC
  Filled 2022-02-16: qty 5

## 2022-02-16 MED ORDER — VITAMIN D (ERGOCALCIFEROL) 1.25 MG (50000 UNIT) PO CAPS: 50000.0000 [IU] | ORAL_CAPSULE | ORAL | Status: DC

## 2022-02-16 MED ORDER — ONDANSETRON HCL 4 MG/2ML IJ SOLN: 4.0000 mg | Freq: Four times a day (QID) | INTRAMUSCULAR | Status: DC | PRN

## 2022-02-16 MED ORDER — SUGAMMADEX SODIUM 200 MG/2ML IV SOLN
INTRAVENOUS | Status: DC | PRN
  Administered 2022-02-16: 200 mg via INTRAVENOUS
  Administered 2022-02-16: 100 mg via INTRAVENOUS

## 2022-02-16 MED ORDER — FENTANYL CITRATE (PF) 250 MCG/5ML IJ SOLN
INTRAMUSCULAR | Status: AC
  Filled 2022-02-16: qty 5

## 2022-02-16 MED ORDER — ACETAMINOPHEN 325 MG PO TABS: 650.0000 mg | ORAL_TABLET | ORAL | Status: DC | PRN

## 2022-02-16 MED ORDER — SERTRALINE HCL 50 MG PO TABS
100.0000 mg | ORAL_TABLET | Freq: Every day | ORAL | Status: DC
  Administered 2022-02-16 – 2022-02-18 (×3): 100 mg via ORAL
  Filled 2022-02-16 (×6): qty 2

## 2022-02-16 MED ORDER — LACTATED RINGERS IV SOLN: INTRAVENOUS | Status: DC

## 2022-02-16 MED ORDER — SODIUM CHLORIDE 0.9 % IV SOLN
250.0000 mL | INTRAVENOUS | Status: DC
  Administered 2022-02-16: 250 mL via INTRAVENOUS

## 2022-02-16 MED ORDER — ONDANSETRON HCL 4 MG/2ML IJ SOLN
INTRAMUSCULAR | Status: AC
  Filled 2022-02-16: qty 2

## 2022-02-16 MED ORDER — PROPOFOL 10 MG/ML IV BOLUS
INTRAVENOUS | Status: AC
  Filled 2022-02-16: qty 20

## 2022-02-16 MED ORDER — OXYCODONE HCL 5 MG PO TABS
5.0000 mg | ORAL_TABLET | Freq: Once | ORAL | Status: AC | PRN
  Administered 2022-02-16: 5 mg via ORAL

## 2022-02-16 MED ORDER — CHLORHEXIDINE GLUCONATE CLOTH 2 % EX PADS: 6.0000 | MEDICATED_PAD | Freq: Once | CUTANEOUS | Status: DC

## 2022-02-16 MED ORDER — HYDROCODONE-ACETAMINOPHEN 5-325 MG PO TABS
2.0000 | ORAL_TABLET | ORAL | Status: DC | PRN
  Administered 2022-02-16 – 2022-02-19 (×14): 2 via ORAL
  Filled 2022-02-16 (×14): qty 2

## 2022-02-16 MED ORDER — MENTHOL 3 MG MT LOZG: 1.0000 | LOZENGE | OROMUCOSAL | Status: DC | PRN

## 2022-02-16 MED ORDER — KETAMINE HCL 50 MG/5ML IJ SOSY
PREFILLED_SYRINGE | INTRAMUSCULAR | Status: AC
  Filled 2022-02-16: qty 5

## 2022-02-16 MED ORDER — CHLORHEXIDINE GLUCONATE 0.12 % MT SOLN
15.0000 mL | Freq: Once | OROMUCOSAL | Status: AC
  Administered 2022-02-16: 15 mL via OROMUCOSAL
  Filled 2022-02-16: qty 15

## 2022-02-16 MED ORDER — SODIUM CHLORIDE 0.9% FLUSH: 3.0000 mL | INTRAVENOUS | Status: DC | PRN

## 2022-02-16 MED ORDER — LISINOPRIL 20 MG PO TABS: 40.0000 mg | ORAL_TABLET | Freq: Every day | ORAL | Status: DC

## 2022-02-16 MED ORDER — KETOROLAC TROMETHAMINE 30 MG/ML IJ SOLN
INTRAMUSCULAR | Status: AC
  Filled 2022-02-16: qty 1

## 2022-02-16 MED ORDER — BUPIVACAINE HCL (PF) 0.25 % IJ SOLN
INTRAMUSCULAR | Status: AC
  Filled 2022-02-16: qty 30

## 2022-02-16 MED ORDER — KETAMINE HCL 10 MG/ML IJ SOLN
INTRAMUSCULAR | Status: DC | PRN
  Administered 2022-02-16: 30 mg via INTRAVENOUS
  Administered 2022-02-16 (×2): 10 mg via INTRAVENOUS

## 2022-02-16 MED ORDER — IBUPROFEN 200 MG PO TABS
800.0000 mg | ORAL_TABLET | Freq: Three times a day (TID) | ORAL | Status: DC | PRN
  Administered 2022-02-19: 800 mg via ORAL
  Filled 2022-02-16: qty 4

## 2022-02-16 MED ORDER — FENTANYL CITRATE (PF) 100 MCG/2ML IJ SOLN
25.0000 ug | INTRAMUSCULAR | Status: DC | PRN
  Administered 2022-02-16 (×3): 50 ug via INTRAVENOUS

## 2022-02-16 MED ORDER — GABAPENTIN 300 MG PO CAPS: 300.0000 mg | ORAL_CAPSULE | Freq: Three times a day (TID) | ORAL | Status: DC

## 2022-02-16 MED ORDER — ORAL CARE MOUTH RINSE: 15.0000 mL | Freq: Once | OROMUCOSAL | Status: AC

## 2022-02-16 MED ORDER — ONDANSETRON HCL 4 MG/2ML IJ SOLN
INTRAMUSCULAR | Status: DC | PRN
  Administered 2022-02-16: 4 mg via INTRAVENOUS

## 2022-02-16 MED ORDER — THROMBIN 5000 UNITS EX SOLR
CUTANEOUS | Status: AC
  Filled 2022-02-16: qty 10000

## 2022-02-16 MED ORDER — ONDANSETRON HCL 4 MG PO TABS: 4.0000 mg | ORAL_TABLET | Freq: Four times a day (QID) | ORAL | Status: DC | PRN

## 2022-02-16 MED ORDER — ACETAMINOPHEN 10 MG/ML IV SOLN
1000.0000 mg | Freq: Once | INTRAVENOUS | Status: DC | PRN
  Administered 2022-02-16: 1000 mg via INTRAVENOUS

## 2022-02-16 MED ORDER — CEFAZOLIN SODIUM-DEXTROSE 2-4 GM/100ML-% IV SOLN
2.0000 g | Freq: Three times a day (TID) | INTRAVENOUS | Status: AC
  Administered 2022-02-16 (×2): 2 g via INTRAVENOUS
  Filled 2022-02-16 (×2): qty 100

## 2022-02-16 MED ORDER — ACETAMINOPHEN 650 MG RE SUPP: 650.0000 mg | RECTAL | Status: DC | PRN

## 2022-02-16 MED ORDER — PHENOL 1.4 % MT LIQD: 1.0000 | OROMUCOSAL | Status: DC | PRN

## 2022-02-16 MED ORDER — PROPOFOL 10 MG/ML IV BOLUS
INTRAVENOUS | Status: DC | PRN
  Administered 2022-02-16: 160 mg via INTRAVENOUS

## 2022-02-16 MED ORDER — LIDOCAINE-EPINEPHRINE 1 %-1:100000 IJ SOLN
INTRAMUSCULAR | Status: AC
  Filled 2022-02-16: qty 1

## 2022-02-16 MED ORDER — POTASSIUM CHLORIDE CRYS ER 20 MEQ PO TBCR: 20.0000 meq | EXTENDED_RELEASE_TABLET | Freq: Every day | ORAL | Status: DC

## 2022-02-16 MED ORDER — ACETAMINOPHEN 160 MG/5ML PO SOLN: 1000.0000 mg | Freq: Once | ORAL | Status: DC | PRN

## 2022-02-16 MED ORDER — DEXAMETHASONE SODIUM PHOSPHATE 10 MG/ML IJ SOLN
INTRAMUSCULAR | Status: AC
  Filled 2022-02-16: qty 1

## 2022-02-16 MED ORDER — ROCURONIUM BROMIDE 10 MG/ML (PF) SYRINGE
PREFILLED_SYRINGE | INTRAVENOUS | Status: AC
  Filled 2022-02-16: qty 10

## 2022-02-16 MED ORDER — ACETAMINOPHEN 325 MG PO TABS: 650.0000 mg | ORAL_TABLET | Freq: Four times a day (QID) | ORAL | Status: DC | PRN

## 2022-02-16 MED ORDER — 0.9 % SODIUM CHLORIDE (POUR BTL) OPTIME
TOPICAL | Status: DC | PRN
  Administered 2022-02-16: 1000 mL

## 2022-02-16 MED ORDER — EPHEDRINE SULFATE-NACL 50-0.9 MG/10ML-% IV SOSY
PREFILLED_SYRINGE | INTRAVENOUS | Status: DC | PRN
  Administered 2022-02-16: 5 mg via INTRAVENOUS

## 2022-02-16 MED ORDER — BUPIVACAINE HCL (PF) 0.25 % IJ SOLN
INTRAMUSCULAR | Status: DC | PRN
  Administered 2022-02-16: 10 mL

## 2022-02-16 MED ORDER — LIDOCAINE-EPINEPHRINE 1 %-1:100000 IJ SOLN
INTRAMUSCULAR | Status: DC | PRN
  Administered 2022-02-16: 10 mL

## 2022-02-16 MED ORDER — MIDAZOLAM HCL 2 MG/2ML IJ SOLN
INTRAMUSCULAR | Status: AC
  Filled 2022-02-16: qty 2

## 2022-02-16 MED ORDER — KETOROLAC TROMETHAMINE 30 MG/ML IJ SOLN
INTRAMUSCULAR | Status: DC | PRN
  Administered 2022-02-16: 30 mg via INTRAVENOUS

## 2022-02-16 MED ORDER — PANTOPRAZOLE SODIUM 40 MG IV SOLR
40.0000 mg | Freq: Every day | INTRAVENOUS | Status: DC
  Administered 2022-02-16: 40 mg via INTRAVENOUS
  Filled 2022-02-16: qty 10

## 2022-02-16 MED ORDER — PHENYLEPHRINE 80 MCG/ML (10ML) SYRINGE FOR IV PUSH (FOR BLOOD PRESSURE SUPPORT)
PREFILLED_SYRINGE | INTRAVENOUS | Status: AC
  Filled 2022-02-16: qty 10

## 2022-02-16 MED ORDER — OXYCODONE HCL 5 MG PO TABS
ORAL_TABLET | ORAL | Status: AC
  Filled 2022-02-16: qty 1

## 2022-02-16 MED ORDER — OXYCODONE HCL 5 MG/5ML PO SOLN: 5.0000 mg | Freq: Once | ORAL | Status: AC | PRN

## 2022-02-16 MED ORDER — THROMBIN (RECOMBINANT) 5000 UNITS EX SOLR
CUTANEOUS | Status: DC | PRN
  Administered 2022-02-16: 10 mL via TOPICAL

## 2022-02-16 MED ORDER — LIDOCAINE 2% (20 MG/ML) 5 ML SYRINGE
INTRAMUSCULAR | Status: DC | PRN
  Administered 2022-02-16: 60 mg via INTRAVENOUS

## 2022-02-16 MED ORDER — FENTANYL CITRATE (PF) 250 MCG/5ML IJ SOLN
INTRAMUSCULAR | Status: DC | PRN
  Administered 2022-02-16: 100 ug via INTRAVENOUS
  Administered 2022-02-16: 50 ug via INTRAVENOUS

## 2022-02-16 MED ORDER — ACETAMINOPHEN 10 MG/ML IV SOLN
INTRAVENOUS | Status: AC
  Filled 2022-02-16: qty 100

## 2022-02-16 MED ORDER — SODIUM CHLORIDE 0.9% FLUSH
3.0000 mL | Freq: Two times a day (BID) | INTRAVENOUS | Status: DC
  Administered 2022-02-16 – 2022-02-18 (×6): 3 mL via INTRAVENOUS

## 2022-02-16 SURGICAL SUPPLY — 46 items
BAG COUNTER SPONGE SURGICOUNT (BAG) ×1 IMPLANT
BAND RUBBER #18 3X1/16 STRL (MISCELLANEOUS) ×2 IMPLANT
BENZOIN TINCTURE PRP APPL 2/3 (GAUZE/BANDAGES/DRESSINGS) ×1 IMPLANT
BLADE CLIPPER SURG (BLADE) IMPLANT
BLADE SURG 11 STRL SS (BLADE) ×1 IMPLANT
BUR CUTTER 7.0 ROUND (BURR) ×1 IMPLANT
BUR MATCHSTICK NEURO 3.0 LAGG (BURR) ×1 IMPLANT
CANISTER SUCT 3000ML PPV (MISCELLANEOUS) ×1 IMPLANT
DERMABOND ADVANCED .7 DNX12 (GAUZE/BANDAGES/DRESSINGS) ×1 IMPLANT
DRAPE HALF SHEET 40X57 (DRAPES) IMPLANT
DRAPE LAPAROTOMY 100X72X124 (DRAPES) ×1 IMPLANT
DRAPE MICROSCOPE SLANT 54X150 (MISCELLANEOUS) ×1 IMPLANT
DRAPE SURG 17X23 STRL (DRAPES) ×1 IMPLANT
DRSG OPSITE POSTOP 4X6 (GAUZE/BANDAGES/DRESSINGS) IMPLANT
DURAPREP 26ML APPLICATOR (WOUND CARE) ×1 IMPLANT
ELECT REM PT RETURN 9FT ADLT (ELECTROSURGICAL) ×1
ELECTRODE REM PT RTRN 9FT ADLT (ELECTROSURGICAL) ×1 IMPLANT
GAUZE 4X4 16PLY ~~LOC~~+RFID DBL (SPONGE) IMPLANT
GAUZE SPONGE 4X4 12PLY STRL (GAUZE/BANDAGES/DRESSINGS) ×1 IMPLANT
GLOVE BIO SURGEON STRL SZ7 (GLOVE) IMPLANT
GLOVE BIO SURGEON STRL SZ8 (GLOVE) ×1 IMPLANT
GLOVE BIOGEL PI IND STRL 7.0 (GLOVE) IMPLANT
GLOVE EXAM NITRILE XL STR (GLOVE) IMPLANT
GLOVE INDICATOR 8.5 STRL (GLOVE) ×2 IMPLANT
GOWN STRL REUS W/ TWL LRG LVL3 (GOWN DISPOSABLE) ×1 IMPLANT
GOWN STRL REUS W/ TWL XL LVL3 (GOWN DISPOSABLE) ×2 IMPLANT
GOWN STRL REUS W/TWL 2XL LVL3 (GOWN DISPOSABLE) IMPLANT
GOWN STRL REUS W/TWL LRG LVL3 (GOWN DISPOSABLE) ×1
GOWN STRL REUS W/TWL XL LVL3 (GOWN DISPOSABLE) ×2
KIT BASIN OR (CUSTOM PROCEDURE TRAY) ×1 IMPLANT
KIT TURNOVER KIT B (KITS) ×1 IMPLANT
NDL SPNL 22GX3.5 QUINCKE BK (NEEDLE) ×1 IMPLANT
NEEDLE HYPO 22GX1.5 SAFETY (NEEDLE) ×1 IMPLANT
NEEDLE SPNL 22GX3.5 QUINCKE BK (NEEDLE) ×1 IMPLANT
NS IRRIG 1000ML POUR BTL (IV SOLUTION) ×1 IMPLANT
PACK LAMINECTOMY NEURO (CUSTOM PROCEDURE TRAY) ×1 IMPLANT
SPIKE FLUID TRANSFER (MISCELLANEOUS) ×1 IMPLANT
SPONGE SURGIFOAM ABS GEL SZ50 (HEMOSTASIS) ×1 IMPLANT
STRIP CLOSURE SKIN 1/2X4 (GAUZE/BANDAGES/DRESSINGS) ×1 IMPLANT
SUT VIC AB 0 CT1 18XCR BRD8 (SUTURE) ×1 IMPLANT
SUT VIC AB 0 CT1 8-18 (SUTURE) ×1
SUT VIC AB 2-0 CT1 18 (SUTURE) ×1 IMPLANT
SUT VICRYL 4-0 PS2 18IN ABS (SUTURE) ×1 IMPLANT
TOWEL GREEN STERILE (TOWEL DISPOSABLE) ×1 IMPLANT
TOWEL GREEN STERILE FF (TOWEL DISPOSABLE) ×1 IMPLANT
WATER STERILE IRR 1000ML POUR (IV SOLUTION) ×1 IMPLANT

## 2022-02-16 NOTE — H&P (Signed)
Erin Zamora is an 51 y.o. female.   Chief Complaint: Back and left leg pain HPI: 51 year old female progressive worsening back left hip and leg pain rating down L5 and S1 nerve root pattern.  Work-up revealed severe spinal stenosis at L4-5 and disc herniation L5-S1.  Due to patient's progression of clinical syndrome imaging findings and failure of conservative treatment I recommended decompressive laminotomy at L4-5 on the left and decompressive laminectomy microdiscectomy at L5-S1 on the left.  I have extensively gone over the risks and benefits of that operation with her as well as perioperative course expectations of outcome and alternatives to surgery and she understood and agreed to proceed forward.  Past Medical History:  Diagnosis Date   Allergy    seasonal   Anemia    Anxiety    CAP (community acquired pneumonia) 10/2018   COPD (chronic obstructive pulmonary disease) ([place])    Cough 10/2018   Dyspnea    Fibromyalgia    Hyperlipidemia 10/2018   Hypertension    Hypokalemia    Insomnia 10/2018   Microcytic anemia    Pre-diabetes    Skin lesions 10/2019   Thrombocytosis    Vitamin D deficiency 10/2018    Past Surgical History:  Procedure Laterality Date   APPENDECTOMY  05/02/1989   CHOLECYSTECTOMY  05/02/2001   COLONOSCOPY WITH PROPOFOL N/A 05/18/2021   Procedure: COLONOSCOPY WITH PROPOFOL;  Surgeon: Jonathon Bellows, MD;  Location: [facility] ENDOSCOPY;  Service: Gastroenterology;  Laterality: N/A;   TRIGGER FINGER RELEASE Right    TUBAL LIGATION  05/02/2001    Family History  Problem Relation Age of Onset   Alcohol abuse Mother    COPD Mother    Hypertension Mother    Alcohol abuse Father    Diabetes Father    Drug abuse Father    Heart disease Father    Hypertension Father    Kidney disease Father    Rheum arthritis Sister    Ankylosing spondylitis Sister    Arthritis Maternal Grandmother    Arthritis Maternal Grandfather    Melanoma Maternal Grandfather     Cancer Paternal Grandmother    Diabetes Paternal Grandmother    Heart disease Paternal Grandmother    Diabetes Paternal Grandfather    Heart disease Paternal Grandfather    Kidney disease Paternal Grandfather    Stroke Paternal Grandfather    Hypertension Daughter    Obesity Daughter    Social History:  reports that she has been smoking cigarettes. She has been smoking an average of .5 packs per day. She has never been exposed to tobacco smoke. She has never used smokeless tobacco. She reports current alcohol use. She reports current drug use. Drug: Marijuana.  Allergies:  Allergies  Allergen Reactions   Codeine Itching and Nausea And Vomiting   Iodine     Unknown  Patient states that her mother had seizure reaction to Iodine.     Medications Prior to Admission  Medication Sig Dispense Refill   albuterol (VENTOLIN HFA) 108 (90 Base) MCG/ACT inhaler Inhale 1-2 puffs into the lungs every 6 (six) hours as needed for wheezing or shortness of breath.     ibuprofen (ADVIL) 200 MG tablet Take 800 mg by mouth every 4 (four) hours as needed for moderate pain.     sertraline (ZOLOFT) 100 MG tablet Take 1 tablet (100 mg total) by mouth daily. 90 tablet 1   acetaminophen (TYLENOL) 325 MG tablet Take 2 tablets (650 mg total) by mouth every 6 (six)  hours as needed. (Patient not taking: Reported on 02/04/2022) 36 tablet 0   gabapentin (NEURONTIN) 300 MG capsule Take 1 capsule (300 mg total) by mouth 3 (three) times daily. (Patient not taking: Reported on 02/04/2022) 90 capsule 3   lisinopril (ZESTRIL) 40 MG tablet Take 1 tablet (40 mg total) by mouth daily. (Patient not taking: Reported on 02/04/2022) 90 tablet 3   potassium chloride SA (KLOR-CON) 20 MEQ tablet Take 1 tablet (20 mEq total) by mouth daily. (Patient not taking: Reported on 02/04/2022) 30 tablet 1   Vitamin D, Ergocalciferol, (DRISDOL) 1.25 MG (50000 UNIT) CAPS capsule Take 1 capsule (50,000 Units total) by mouth every 7 (seven) days.  (Patient not taking: Reported on 02/04/2022) 5 capsule 6    Results for orders placed or performed during the hospital encounter of 02/16/22 (from the past 48 hour(s))  Pregnancy, urine POC     Status: None   Collection Time: 02/16/22  7:28 AM  Result Value Ref Range   Preg Test, Ur NEGATIVE NEGATIVE    Comment:        THE SENSITIVITY OF THIS METHODOLOGY IS >24 mIU/mL    No results found.  Review of Systems  Musculoskeletal:  Positive for back pain.  Neurological:  Positive for numbness.    Blood pressure (!) 150/94, pulse 76, temperature 98.3 F (36.8 C), temperature source Oral, resp. rate 20, height '5\' 5"'$  (1.651 m), weight 108.6 kg, last menstrual period 10/30/2021, SpO2 93 %. Physical Exam HENT:     Head: Normocephalic.     Right Ear: Tympanic membrane normal.     Nose: Nose normal.  Eyes:     Pupils: Pupils are equal, round, and reactive to light.  Cardiovascular:     Rate and Rhythm: Normal rate.  Pulmonary:     Effort: Pulmonary effort is normal.  Abdominal:     General: Abdomen is flat.  Musculoskeletal:        General: Normal range of motion.  Neurological:     Mental Status: She is alert.     Comments: Strength is 5-5 iliopsoas, quads, hamstrings, gastrocs, into tibialis, EHL.      Assessment/Plan 51 year old presents for decompressive laminectomies L4-5 L5-S1 microdiscectomy at L5-S1.  Erin Hoops, MD 02/16/2022, 8:20 AM

## 2022-02-16 NOTE — Op Note (Signed)
Preoperative diagnosis: Lumbar spinal stenosis L4-5 with left L5 radiculopathy and herniated nucleus pulposus L5-S1 with left S1 radiculopathy.  Postoperative diagnosis: Same.  Procedure: #1 decompressive laminectomy with microscopic partial facetectomy and foraminotomies at L4-5 on the left.  2.  Lumbar microdiscectomy with scopic discectomy and foraminotomy of the left S1 nerve root.  Surgeon: Kary Kos.  Assistant: Nash Shearer.  Anesthesia: General.  EBL: Minimal.  HPI: 51 year old female with back left leg pain consistent with L5 and S1 work-up revealed lateral recess stenosis at L4-5 on the left as well as a disc rupture at L5-S1 on the left.  Due to patient's progression of clinical syndrome imaging findings of a conservative treatment I recommended decompressive laminotomy at L4-5 on the left and lumbar microdiscectomy at L5-S1 the left.  I have extensively gone over the risks and benefits of that operation with her as well as perioperative course expectations of outcome and alternatives of surgery and she understood and agreed to proceed forward.  Operative procedure: Patient was brought into the OR was induced under general anesthesia positioned prone the Wilson frame her back was prepped and draped in routine sterile fashion preoperative x-ray localized the appropriate level.  So after infiltration of 10 cc lidocaine with epi midline incision was made and Bovie electrocautery was used to take down the subcutaneous tissue and subperiosteal dissection was carried lamina of L4-5 and S1.  All in the left.  Intraoperative x-ray confirmed the correct level and so then the inferior aspect of lamina L4 medial facet complex superior aspect the lamina of L5 and inferior L5 medial facet and superior S1 was all drilled out with a high-speed drill.  Laminotomy was begun initially at L5-S1 ligament was identified and removed in piecemeal fashion exposing the thecal sac.  S1 pedicle was identified  and tension taken L4-5 in a similar fashion under biting the medial facet remove the ligament flavum identified identified the thecal sac and left L5 pedicle.  Then under microscopic lamination first working at L4-5 a large spur coming off the medial inferior aspect the facet joint was causing severe compression this was all teased off of the L5 nerve root and removed in piecemeal fashion and unroofing the foramen.  This decompressed the thecal sac and L5 nerve root disc base was inspected and felt not to be bulging or significant.  So this was then packed with Gelfoam tension taken L5-S1 in a similar fashion medial facet complex was under Bitton S1 nerve root was identified S1 foramen was unroofed and then dissecting the S1 nerve root off a very large disc herniation still partially contained with the ligament ligament was incised free fragments were removed the disc base was entered and cleaned out with Epstein curettes and pituitary rongeurs.  At the end of discectomy there is no further stenosis on the thecal sac or left S1 nerve root both laminotomies were copiously irrigated meticulous hemostasis was maintained Gelfoam was ON top of the dura and the muscle and fascia reapproximated layers with interrupted Vicryl and the skin was closed with a running 4 subcuticular Dermabond benzoin Steri-Strips and a sterile dressing was applied patient recovery in stable condition.  At the end the case all needle count sponge counts were correct.

## 2022-02-16 NOTE — Transfer of Care (Signed)
Immediate Anesthesia Transfer of Care Note  Patient: Erin Zamora  Procedure(s) Performed: Left - Lumbar Four-Lumbar Five laminectomy and foraminotomy, Left - Lumbar Five-Sacral One microdiskectomy (Left: Back)  Patient Location: PACU  Anesthesia Type:General  Level of Consciousness: awake, alert  and oriented  Airway & Oxygen Therapy: Patient Spontanous Breathing and Patient connected to face mask oxygen  Post-op Assessment: Report given to RN and Post -op Vital signs reviewed and stable  Post vital signs: Reviewed and stable  Last Vitals:  Vitals Value Taken Time  BP 139/80 02/16/22 1100  Temp 36.3 C 02/16/22 1056  Pulse 97 02/16/22 1107  Resp 25 02/16/22 1107  SpO2 95 % 02/16/22 1107  Vitals shown include unvalidated device data.  Last Pain:  Vitals:   02/16/22 0711  TempSrc: Oral  PainSc: 6       Patients Stated Pain Goal: 3 (11/94/17 4081)  Complications: No notable events documented.

## 2022-02-16 NOTE — Anesthesia Preprocedure Evaluation (Signed)
Anesthesia Evaluation  Patient identified by MRN, date of birth, ID band Patient awake    Reviewed: Allergy & Precautions, NPO status , Patient's Chart, lab work & pertinent test results  History of Anesthesia Complications Negative for: history of anesthetic complications  Airway Mallampati: III  TM Distance: >3 FB Neck ROM: Full    Dental  (+) Teeth Intact, Dental Advisory Given,    Pulmonary COPD,  COPD inhaler, Recent URI , Resolved, Current Smoker and Patient abstained from smoking.   breath sounds clear to auscultation       Cardiovascular hypertension, (-) angina (-) Past MI and (-) CHF  Rhythm:Regular     Neuro/Psych  PSYCHIATRIC DISORDERS Anxiety Depression     Neuromuscular disease    GI/Hepatic negative GI ROS, Neg liver ROS,,,  Endo/Other    Morbid obesity  Renal/GU negative Renal ROS     Musculoskeletal  (+)  Fibromyalgia -  Abdominal   Peds  Hematology negative hematology ROS (+) Lab Results      Component                Value               Date                      WBC                      11.6 (H)            02/09/2022                HGB                      14.8                02/09/2022                HCT                      44.4                02/09/2022                MCV                      89.3                02/09/2022                PLT                      558 (H)             02/09/2022              Anesthesia Other Findings   Reproductive/Obstetrics                             Anesthesia Physical Anesthesia Plan  ASA: 3  Anesthesia Plan: General   Post-op Pain Management: Ofirmev IV (intra-op)*, Toradol IV (intra-op)* and Ketamine IV*   Induction: Intravenous  PONV Risk Score and Plan: 2 and Ondansetron and Dexamethasone  Airway Management Planned: Oral ETT  Additional Equipment: None  Intra-op Plan:   Post-operative Plan: Extubation in  OR  Informed Consent: I have reviewed the patients History and Physical, chart, labs  and discussed the procedure including the risks, benefits and alternatives for the proposed anesthesia with the patient or authorized representative who has indicated his/her understanding and acceptance.     Dental advisory given  Plan Discussed with: CRNA  Anesthesia Plan Comments:        Anesthesia Quick Evaluation

## 2022-02-16 NOTE — Anesthesia Procedure Notes (Signed)
Procedure Name: Intubation Date/Time: 02/16/2022 8:49 AM  Performed by: Mariea Clonts, CRNAPre-anesthesia Checklist: Patient identified, Emergency Drugs available, Suction available and Patient being monitored Patient Re-evaluated:Patient Re-evaluated prior to induction Oxygen Delivery Method: Circle System Utilized Preoxygenation: Pre-oxygenation with 100% oxygen Induction Type: IV induction Ventilation: Mask ventilation without difficulty Laryngoscope Size: Miller and 2 Grade View: Grade II Tube type: Oral Number of attempts: 1 Airway Equipment and Method: Stylet and Oral airway Placement Confirmation: ETT inserted through vocal cords under direct vision, positive ETCO2 and breath sounds checked- equal and bilateral Tube secured with: Tape Dental Injury: Teeth and Oropharynx as per pre-operative assessment

## 2022-02-17 ENCOUNTER — Encounter (HOSPITAL_COMMUNITY): Payer: Self-pay | Admitting: Neurosurgery

## 2022-02-17 ENCOUNTER — Telehealth: Payer: 59 | Admitting: Oncology

## 2022-02-17 ENCOUNTER — Ambulatory Visit (HOSPITAL_COMMUNITY): Payer: 59

## 2022-02-17 DIAGNOSIS — D509 Iron deficiency anemia, unspecified: Secondary | ICD-10-CM

## 2022-02-17 MED ORDER — GADOBUTROL 1 MMOL/ML IV SOLN
10.0000 mL | Freq: Once | INTRAVENOUS | Status: AC | PRN
  Administered 2022-02-17: 10 mL via INTRAVENOUS

## 2022-02-17 MED ORDER — DEXAMETHASONE SODIUM PHOSPHATE 10 MG/ML IJ SOLN
10.0000 mg | Freq: Once | INTRAMUSCULAR | Status: AC
  Administered 2022-02-17: 10 mg via INTRAVENOUS
  Filled 2022-02-17: qty 1

## 2022-02-17 MED ORDER — PANTOPRAZOLE SODIUM 40 MG PO TBEC
40.0000 mg | DELAYED_RELEASE_TABLET | Freq: Every day | ORAL | Status: DC
  Administered 2022-02-17 – 2022-02-18 (×2): 40 mg via ORAL
  Filled 2022-02-17 (×2): qty 1

## 2022-02-17 MED ORDER — DEXAMETHASONE SODIUM PHOSPHATE 4 MG/ML IJ SOLN
4.0000 mg | Freq: Four times a day (QID) | INTRAMUSCULAR | Status: AC
  Administered 2022-02-17 – 2022-02-18 (×4): 4 mg via INTRAVENOUS
  Filled 2022-02-17 (×4): qty 1

## 2022-02-17 NOTE — Anesthesia Postprocedure Evaluation (Signed)
Anesthesia Post Note  Patient: Erin Zamora  Procedure(s) Performed: Left - Lumbar Four-Lumbar Five laminectomy and foraminotomy, Left - Lumbar Five-Sacral One microdiskectomy (Left: Back)     Patient location during evaluation: PACU Anesthesia Type: General Level of consciousness: awake and alert Pain management: pain level controlled Vital Signs Assessment: post-procedure vital signs reviewed and stable Respiratory status: spontaneous breathing, nonlabored ventilation and respiratory function stable Cardiovascular status: blood pressure returned to baseline and stable Postop Assessment: no apparent nausea or vomiting Anesthetic complications: no   No notable events documented.  Last Vitals:  Vitals:   02/16/22 2349 02/17/22 0400  BP: 109/64 112/67  Pulse: 79 65  Resp: 16 16  Temp: 36.8 C 36.5 C  SpO2: 95% 96%    Last Pain:  Vitals:   02/17/22 0649  TempSrc:   PainSc: 4                  Dean Wonder

## 2022-02-17 NOTE — Evaluation (Signed)
Physical Therapy Evaluation and Discharge Patient Details Name: Erin Zamora MRN: [id number] DOB: 1970/09/17 Today's Date: 02/17/2022  History of Present Illness  51 year old female adm 02/16/22 for  decompressive laminotomy at L4-5 on the left and decompressive laminectomy microdiscectomy at L5-S1 on the left. PMH anxiety, COPD, fibromyalgia, HTN,  Clinical Impression   Patient evaluated by Physical Therapy with no further acute PT needs identified. All education has been completed and the patient has no further questions. Patient limited by LLE sciatic-type pain, however able to ambulate independently without device or antalgic pattern. Able to complete stair training. Recommend OT consult to review ADLs in light of degree of pain she is having.  See below for any follow-up Physical Therapy or equipment needs. PT is signing off. Thank you for this referral.        Recommendations for follow up therapy are one component of a multi-disciplinary discharge planning process, led by the attending physician.  Recommendations may be updated based on patient status, additional functional criteria and insurance authorization.  Follow Up Recommendations No PT follow up      Assistance Recommended at Discharge PRN  Patient can return home with the following       Equipment Recommendations BSC/3in1  Recommendations for Other Services  OT consult (RN to place order)    Functional Status Assessment Patient has had a recent decline in their functional status and demonstrates the ability to make significant improvements in function in a reasonable and predictable amount of time.     Precautions / Restrictions Precautions Precautions: Back Precaution Booklet Issued: Yes (comment) Precaution Comments: reviewed mobility portion of handout with OT to review ADLs Restrictions Weight Bearing Restrictions: No      Mobility  Bed Mobility Overal bed mobility: Modified Independent              General bed mobility comments: from side to sit and sit to side with use of rail    Transfers Overall transfer level: Independent Equipment used: None               General transfer comment: from EOB; from [facility] over toilet    Ambulation/Gait Ambulation/Gait assistance: Independent Gait Distance (Feet): 180 Feet Assistive device: None Gait Pattern/deviations: Step-through pattern   Gait velocity interpretation: 1.31 - 2.62 ft/sec, indicative of limited community ambulator   General Gait Details: slower pace, but not limping and no imbalance therefore no need for RW  Stairs Stairs: Yes Stairs assistance: Modified independent (Device/Increase time) Stair Management: Two rails, Step to pattern, Forwards Number of Stairs: 1 General stair comments: limited to one step due to incr pain in LLE; cues fo proper sequencing with no assist needed  Wheelchair Mobility    Modified Rankin (Stroke Patients Only)       Balance Overall balance assessment: Independent                                           Pertinent Vitals/Pain Pain Assessment Pain Assessment: 0-10 Pain Score: 8  Pain Location: left buttock and posterior thigh Pain Descriptors / Indicators: Aching, Guarding, Heaviness, Numbness Pain Intervention(s): Limited activity within patient's tolerance    Home Living Family/patient expects to be discharged to:: Private residence Living Arrangements: Children;Spouse/significant other Available Help at Discharge: Family;Available PRN/intermittently Type of Home: Mobile home Home Access: Stairs to enter Entrance Stairs-Rails: Right;Left;Can reach both Entrance Stairs-Number of Steps:  4   Home Layout: One level Home Equipment: Conservation officer, nature (2 wheels) Additional Comments: RW was her husband's, thinks she may need to use to get up from low mattress    Prior Function Prior Level of Function : Independent/Modified Independent                      Hand Dominance        Extremity/Trunk Assessment   Upper Extremity Assessment Upper Extremity Assessment: Overall WFL for tasks assessed    Lower Extremity Assessment Lower Extremity Assessment: LLE deficits/detail LLE Deficits / Details: very painful with mobility, but not limping    Cervical / Trunk Assessment Cervical / Trunk Assessment: Other exceptions Cervical / Trunk Exceptions: overweight  Communication   Communication: No difficulties  Cognition Arousal/Alertness: Awake/alert Behavior During Therapy: WFL for tasks assessed/performed Overall Cognitive Status: Within Functional Limits for tasks assessed                                          General Comments      Exercises     Assessment/Plan    PT Assessment Patient does not need any further PT services  PT Problem List         PT Treatment Interventions      PT Goals (Current goals can be found in the Care Plan section)  Acute Rehab PT Goals Patient Stated Goal: decr LLE pain PT Goal Formulation: All assessment and education complete, DC therapy    Frequency       Co-evaluation               AM-PAC PT "6 Clicks" Mobility  Outcome Measure Help needed turning from your back to your side while in a flat bed without using bedrails?: None Help needed moving from lying on your back to sitting on the side of a flat bed without using bedrails?: None Help needed moving to and from a bed to a chair (including a wheelchair)?: None Help needed standing up from a chair using your arms (e.g., wheelchair or bedside chair)?: None Help needed to walk in hospital room?: None Help needed climbing 3-5 steps with a railing? : None 6 Click Score: 24    End of Session   Activity Tolerance: Patient limited by pain Patient left: in bed;with call bell/phone within reach Nurse Communication: Mobility status;Other (comment) (incr pain despite premedication) PT Visit Diagnosis:  Difficulty in walking, not elsewhere classified (R26.2)    Time: 1017-5102 PT Time Calculation (min) (ACUTE ONLY): 27 min   Charges:   PT Evaluation $PT Eval Low Complexity: 1 Low PT Treatments $Gait Training: 8-22 mins         Arby Barrette, PT Acute Rehabilitation Services  Office [phone]   Rexanne Mano 02/17/2022, 9:04 AM

## 2022-02-17 NOTE — Progress Notes (Signed)
Patient still c/o both leg pressure radiating to her back, Left leg is worst. patient stated is difficult to walk. Doctor Saintclair Halsted aware.

## 2022-02-17 NOTE — Progress Notes (Signed)
Subjective: Patient reports severe left leg pain down the back of her leg with numbness  Objective: Vital signs in last 24 hours: Temp:  [97.3 F (36.3 C)-98.7 F (37.1 C)] 97.7 F (36.5 C) (10/19 0400) Pulse Rate:  [65-99] 65 (10/19 0400) Resp:  [13-23] 16 (10/19 0400) BP: (109-151)/(56-80) 112/67 (10/19 0400) SpO2:  [88 %-99 %] 96 % (10/19 0400)  Intake/Output from previous day: 10/18 0701 - 10/19 0700 In: 1400 [I.V.:1300; IV Piggyback:100] Out: 225 [Blood:225] Intake/Output this shift: No intake/output data recorded.  Neurologic: Grossly normal  Lab Results: Lab Results  Component Value Date   WBC 11.6 (H) 02/09/2022   HGB 14.8 02/09/2022   HCT 44.4 02/09/2022   MCV 89.3 02/09/2022   PLT 558 (H) 02/09/2022   No results found for: "INR", "PROTIME" BMET Lab Results  Component Value Date   NA 140 02/09/2022   K 3.7 02/09/2022   CL 102 02/09/2022   CO2 30 02/09/2022   GLUCOSE 139 (H) 02/09/2022   BUN 11 02/09/2022   CREATININE 0.88 02/09/2022   CALCIUM 9.0 02/09/2022    Studies/Results: DG Lumbar Spine 2-3 Views  Result Date: 02/16/2022 CLINICAL DATA:  Preop lateral views of the lumbar spine for localization. EXAM: LUMBAR SPINE - 2-3 VIEW COMPARISON:  MRI lumbar spine dated December 28, 2021 FINDINGS: Preop cross-table lateral view of the lumbar spine for localization, needle marker is at the level of L5-S1 facet joint. No acute fracture or subluxation. Degenerate disc disease of the lumbar spine. IMPRESSION: Needle marker is at the level of L5-S1 facet joint. Electronically Signed   By: Keane Police D.O.   On: 02/16/2022 12:09    Assessment/Plan: Postop day 1 lumbar laminectomy, having some S1 radiculopathy but no weakness on exam. Will put her on decadron to see if this helps with her symptoms.    LOS: 0 days    Ocie Cornfield Lavonia Eager 02/17/2022, 8:13 AM

## 2022-02-18 ENCOUNTER — Ambulatory Visit (HOSPITAL_COMMUNITY): Payer: 59 | Admitting: Anesthesiology

## 2022-02-18 ENCOUNTER — Ambulatory Visit (HOSPITAL_COMMUNITY): Payer: 59

## 2022-02-18 ENCOUNTER — Other Ambulatory Visit: Payer: Self-pay

## 2022-02-18 ENCOUNTER — Inpatient Hospital Stay (HOSPITAL_COMMUNITY)
Admission: RE | Disposition: A | Discharge status: Home / Self Care | Payer: Self-pay | Source: Home / Self Care | Attending: Neurosurgery

## 2022-02-18 ENCOUNTER — Inpatient Hospital Stay: Admit: 2022-02-18 | Payer: 59 | Admitting: Neurosurgery

## 2022-02-18 ENCOUNTER — Encounter (HOSPITAL_COMMUNITY): Payer: Self-pay | Admitting: Neurosurgery

## 2022-02-18 DIAGNOSIS — E785 Hyperlipidemia, unspecified: Secondary | ICD-10-CM | POA: Diagnosis present

## 2022-02-18 DIAGNOSIS — Y834 Other reconstructive surgery as the cause of abnormal reaction of the patient, or of later complication, without mention of misadventure at the time of the procedure: Secondary | ICD-10-CM | POA: Diagnosis not present

## 2022-02-18 DIAGNOSIS — F17210 Nicotine dependence, cigarettes, uncomplicated: Secondary | ICD-10-CM | POA: Diagnosis present

## 2022-02-18 DIAGNOSIS — J449 Chronic obstructive pulmonary disease, unspecified: Secondary | ICD-10-CM

## 2022-02-18 DIAGNOSIS — M48061 Spinal stenosis, lumbar region without neurogenic claudication: Secondary | ICD-10-CM | POA: Diagnosis present

## 2022-02-18 DIAGNOSIS — Z79899 Other long term (current) drug therapy: Secondary | ICD-10-CM | POA: Diagnosis not present

## 2022-02-18 DIAGNOSIS — M5126 Other intervertebral disc displacement, lumbar region: Secondary | ICD-10-CM | POA: Diagnosis present

## 2022-02-18 DIAGNOSIS — R7303 Prediabetes: Secondary | ICD-10-CM | POA: Diagnosis present

## 2022-02-18 DIAGNOSIS — Z6839 Body mass index (BMI) 39.0-39.9, adult: Secondary | ICD-10-CM | POA: Diagnosis not present

## 2022-02-18 DIAGNOSIS — G9761 Postprocedural hematoma of a nervous system organ or structure following a nervous system procedure: Secondary | ICD-10-CM | POA: Diagnosis not present

## 2022-02-18 DIAGNOSIS — M797 Fibromyalgia: Secondary | ICD-10-CM | POA: Diagnosis present

## 2022-02-18 DIAGNOSIS — Z8249 Family history of ischemic heart disease and other diseases of the circulatory system: Secondary | ICD-10-CM | POA: Diagnosis not present

## 2022-02-18 DIAGNOSIS — Z885 Allergy status to narcotic agent status: Secondary | ICD-10-CM | POA: Diagnosis not present

## 2022-02-18 DIAGNOSIS — T8483XA Hemorrhage due to internal orthopedic prosthetic devices, implants and grafts, initial encounter: Secondary | ICD-10-CM | POA: Diagnosis not present

## 2022-02-18 DIAGNOSIS — I10 Essential (primary) hypertension: Secondary | ICD-10-CM | POA: Diagnosis present

## 2022-02-18 DIAGNOSIS — M5117 Intervertebral disc disorders with radiculopathy, lumbosacral region: Secondary | ICD-10-CM | POA: Diagnosis present

## 2022-02-18 DIAGNOSIS — Z825 Family history of asthma and other chronic lower respiratory diseases: Secondary | ICD-10-CM | POA: Diagnosis not present

## 2022-02-18 HISTORY — PX: LUMBAR LAMINECTOMY/DECOMPRESSION MICRODISCECTOMY: SHX5026

## 2022-02-18 LAB — POCT I-STAT, CHEM 8
BUN: 16 mg/dL (ref 6–20)
Calcium, Ion: 1.26 mmol/L (ref 1.15–1.40)
Chloride: 96 mmol/L — ABNORMAL LOW (ref 98–111)
Creatinine, Ser: 0.6 mg/dL (ref 0.44–1.00)
Glucose, Bld: 137 mg/dL — ABNORMAL HIGH (ref 70–99)
HCT: 39 % (ref 36.0–46.0)
Hemoglobin: 13.3 g/dL (ref 12.0–15.0)
Potassium: 4.1 mmol/L (ref 3.5–5.1)
Sodium: 138 mmol/L (ref 135–145)
TCO2: 33 mmol/L — ABNORMAL HIGH (ref 22–32)

## 2022-02-18 SURGERY — LUMBAR LAMINECTOMY/DECOMPRESSION MICRODISCECTOMY 1 LEVEL
Anesthesia: General | Site: Back

## 2022-02-18 MED ORDER — LACTATED RINGERS IV SOLN: INTRAVENOUS | Status: DC

## 2022-02-18 MED ORDER — PHENYLEPHRINE 80 MCG/ML (10ML) SYRINGE FOR IV PUSH (FOR BLOOD PRESSURE SUPPORT)
PREFILLED_SYRINGE | INTRAVENOUS | Status: DC | PRN
  Administered 2022-02-18: 80 ug via INTRAVENOUS

## 2022-02-18 MED ORDER — OXYCODONE HCL 5 MG/5ML PO SOLN: 5.0000 mg | Freq: Once | ORAL | Status: DC | PRN

## 2022-02-18 MED ORDER — CEFAZOLIN SODIUM-DEXTROSE 2-4 GM/100ML-% IV SOLN
2.0000 g | Freq: Once | INTRAVENOUS | Status: AC
  Administered 2022-02-18: 2 g via INTRAVENOUS

## 2022-02-18 MED ORDER — AMISULPRIDE (ANTIEMETIC) 5 MG/2ML IV SOLN: 10.0000 mg | Freq: Once | INTRAVENOUS | Status: DC | PRN

## 2022-02-18 MED ORDER — MIDAZOLAM HCL 2 MG/2ML IJ SOLN
INTRAMUSCULAR | Status: DC | PRN
  Administered 2022-02-18: 2 mg via INTRAVENOUS

## 2022-02-18 MED ORDER — BUPIVACAINE HCL (PF) 0.25 % IJ SOLN
INTRAMUSCULAR | Status: DC | PRN
  Administered 2022-02-18: 4 mL

## 2022-02-18 MED ORDER — LIDOCAINE-EPINEPHRINE 1 %-1:100000 IJ SOLN
INTRAMUSCULAR | Status: AC
  Filled 2022-02-18: qty 1

## 2022-02-18 MED ORDER — THROMBIN 5000 UNITS EX SOLR
CUTANEOUS | Status: AC
  Filled 2022-02-18: qty 10000

## 2022-02-18 MED ORDER — ONDANSETRON HCL 4 MG/2ML IJ SOLN
INTRAMUSCULAR | Status: DC | PRN
  Administered 2022-02-18: 4 mg via INTRAVENOUS

## 2022-02-18 MED ORDER — FENTANYL CITRATE (PF) 100 MCG/2ML IJ SOLN
INTRAMUSCULAR | Status: AC
  Filled 2022-02-18: qty 2

## 2022-02-18 MED ORDER — FENTANYL CITRATE (PF) 250 MCG/5ML IJ SOLN
INTRAMUSCULAR | Status: DC | PRN
  Administered 2022-02-18: 100 ug via INTRAVENOUS

## 2022-02-18 MED ORDER — CHLORHEXIDINE GLUCONATE 0.12 % MT SOLN
15.0000 mL | Freq: Once | OROMUCOSAL | Status: AC
  Administered 2022-02-18: 15 mL via OROMUCOSAL

## 2022-02-18 MED ORDER — 0.9 % SODIUM CHLORIDE (POUR BTL) OPTIME
TOPICAL | Status: DC | PRN
  Administered 2022-02-18: 1000 mL

## 2022-02-18 MED ORDER — SUGAMMADEX SODIUM 200 MG/2ML IV SOLN
INTRAVENOUS | Status: DC | PRN
  Administered 2022-02-18: 400 mg via INTRAVENOUS

## 2022-02-18 MED ORDER — PROPOFOL 500 MG/50ML IV EMUL
INTRAVENOUS | Status: DC | PRN
  Administered 2022-02-18: 30 ug/kg/min via INTRAVENOUS

## 2022-02-18 MED ORDER — PROMETHAZINE HCL 25 MG/ML IJ SOLN: 6.2500 mg | INTRAMUSCULAR | Status: DC | PRN

## 2022-02-18 MED ORDER — DEXAMETHASONE SODIUM PHOSPHATE 10 MG/ML IJ SOLN
INTRAMUSCULAR | Status: DC | PRN
  Administered 2022-02-18: 10 mg via INTRAVENOUS

## 2022-02-18 MED ORDER — MIDAZOLAM HCL 2 MG/2ML IJ SOLN
INTRAMUSCULAR | Status: AC
  Filled 2022-02-18: qty 2

## 2022-02-18 MED ORDER — ORAL CARE MOUTH RINSE: 15.0000 mL | Freq: Once | OROMUCOSAL | Status: AC

## 2022-02-18 MED ORDER — IPRATROPIUM-ALBUTEROL 0.5-2.5 (3) MG/3ML IN SOLN
RESPIRATORY_TRACT | Status: AC
  Filled 2022-02-18: qty 3

## 2022-02-18 MED ORDER — SUCCINYLCHOLINE CHLORIDE 200 MG/10ML IV SOSY
PREFILLED_SYRINGE | INTRAVENOUS | Status: DC | PRN
  Administered 2022-02-18: 160 mg via INTRAVENOUS

## 2022-02-18 MED ORDER — HYDROMORPHONE HCL 1 MG/ML IJ SOLN
0.2500 mg | INTRAMUSCULAR | Status: DC | PRN
  Administered 2022-02-18 (×2): 0.5 mg via INTRAVENOUS

## 2022-02-18 MED ORDER — CEFAZOLIN SODIUM-DEXTROSE 2-4 GM/100ML-% IV SOLN
INTRAVENOUS | Status: AC
  Filled 2022-02-18: qty 100

## 2022-02-18 MED ORDER — CEFAZOLIN SODIUM-DEXTROSE 2-4 GM/100ML-% IV SOLN
2.0000 g | Freq: Three times a day (TID) | INTRAVENOUS | Status: AC
  Administered 2022-02-18 – 2022-02-19 (×2): 2 g via INTRAVENOUS
  Filled 2022-02-18 (×2): qty 100

## 2022-02-18 MED ORDER — FENTANYL CITRATE (PF) 100 MCG/2ML IJ SOLN: 25.0000 ug | INTRAMUSCULAR | Status: DC | PRN

## 2022-02-18 MED ORDER — PROPOFOL 10 MG/ML IV BOLUS
INTRAVENOUS | Status: DC | PRN
  Administered 2022-02-18: 100 mg via INTRAVENOUS

## 2022-02-18 MED ORDER — THROMBIN (RECOMBINANT) 5000 UNITS EX SOLR
CUTANEOUS | Status: DC | PRN
  Administered 2022-02-18: 10 mL via TOPICAL

## 2022-02-18 MED ORDER — IPRATROPIUM-ALBUTEROL 0.5-2.5 (3) MG/3ML IN SOLN
3.0000 mL | Freq: Once | RESPIRATORY_TRACT | Status: AC
  Administered 2022-02-18: 3 mL via RESPIRATORY_TRACT

## 2022-02-18 MED ORDER — FENTANYL CITRATE (PF) 250 MCG/5ML IJ SOLN
INTRAMUSCULAR | Status: AC
  Filled 2022-02-18: qty 5

## 2022-02-18 MED ORDER — BUPIVACAINE HCL (PF) 0.25 % IJ SOLN
INTRAMUSCULAR | Status: AC
  Filled 2022-02-18: qty 30

## 2022-02-18 MED ORDER — KETAMINE HCL 50 MG/5ML IJ SOSY
PREFILLED_SYRINGE | INTRAMUSCULAR | Status: AC
  Filled 2022-02-18: qty 5

## 2022-02-18 MED ORDER — ACETAMINOPHEN 10 MG/ML IV SOLN: 1000.0000 mg | Freq: Once | INTRAVENOUS | Status: DC | PRN

## 2022-02-18 MED ORDER — LIDOCAINE 2% (20 MG/ML) 5 ML SYRINGE
INTRAMUSCULAR | Status: DC | PRN
  Administered 2022-02-18: 100 mg via INTRAVENOUS

## 2022-02-18 MED ORDER — KETAMINE HCL 10 MG/ML IJ SOLN
INTRAMUSCULAR | Status: DC | PRN
  Administered 2022-02-18: 20 mg via INTRAVENOUS

## 2022-02-18 MED ORDER — HYDROMORPHONE HCL 1 MG/ML IJ SOLN
INTRAMUSCULAR | Status: AC
  Administered 2022-02-18: 0.5 mg via INTRAVENOUS
  Filled 2022-02-18: qty 1

## 2022-02-18 MED ORDER — OXYCODONE HCL 5 MG PO TABS: 5.0000 mg | ORAL_TABLET | Freq: Once | ORAL | Status: DC | PRN

## 2022-02-18 MED ORDER — ROCURONIUM BROMIDE 10 MG/ML (PF) SYRINGE
PREFILLED_SYRINGE | INTRAVENOUS | Status: DC | PRN
  Administered 2022-02-18: 60 mg via INTRAVENOUS

## 2022-02-18 MED ORDER — ALBUTEROL SULFATE HFA 108 (90 BASE) MCG/ACT IN AERS
INHALATION_SPRAY | RESPIRATORY_TRACT | Status: DC | PRN
  Administered 2022-02-18: 8 via RESPIRATORY_TRACT

## 2022-02-18 MED FILL — Thrombin For Soln 5000 Unit: CUTANEOUS | Qty: 2 | Status: AC

## 2022-02-18 SURGICAL SUPPLY — 48 items
BAG COUNTER SPONGE SURGICOUNT (BAG) ×1 IMPLANT
BAND RUBBER #18 3X1/16 STRL (MISCELLANEOUS) ×2 IMPLANT
BENZOIN TINCTURE PRP APPL 2/3 (GAUZE/BANDAGES/DRESSINGS) ×1 IMPLANT
BLADE CLIPPER SURG (BLADE) IMPLANT
BLADE SURG 11 STRL SS (BLADE) ×1 IMPLANT
BUR CUTTER 7.0 ROUND (BURR) ×1 IMPLANT
BUR MATCHSTICK NEURO 3.0 LAGG (BURR) ×1 IMPLANT
CANISTER SUCT 3000ML PPV (MISCELLANEOUS) ×1 IMPLANT
DERMABOND ADVANCED .7 DNX12 (GAUZE/BANDAGES/DRESSINGS) ×1 IMPLANT
DRAPE HALF SHEET 40X57 (DRAPES) IMPLANT
DRAPE LAPAROTOMY 100X72X124 (DRAPES) ×1 IMPLANT
DRAPE MICROSCOPE SLANT 54X150 (MISCELLANEOUS) ×1 IMPLANT
DRAPE SURG 17X23 STRL (DRAPES) ×1 IMPLANT
DRSG OPSITE POSTOP 4X6 (GAUZE/BANDAGES/DRESSINGS) IMPLANT
DURAPREP 26ML APPLICATOR (WOUND CARE) ×1 IMPLANT
ELECT REM PT RETURN 9FT ADLT (ELECTROSURGICAL) ×1
ELECTRODE REM PT RTRN 9FT ADLT (ELECTROSURGICAL) ×1 IMPLANT
EVACUATOR 1/8 PVC DRAIN (DRAIN) IMPLANT
GAUZE 4X4 16PLY ~~LOC~~+RFID DBL (SPONGE) IMPLANT
GAUZE SPONGE 4X4 12PLY STRL (GAUZE/BANDAGES/DRESSINGS) ×1 IMPLANT
GLOVE BIO SURGEON STRL SZ7 (GLOVE) IMPLANT
GLOVE BIO SURGEON STRL SZ8 (GLOVE) ×1 IMPLANT
GLOVE BIOGEL PI IND STRL 7.0 (GLOVE) IMPLANT
GLOVE EXAM NITRILE XL STR (GLOVE) IMPLANT
GLOVE INDICATOR 8.5 STRL (GLOVE) ×2 IMPLANT
GOWN STRL REUS W/ TWL LRG LVL3 (GOWN DISPOSABLE) ×1 IMPLANT
GOWN STRL REUS W/ TWL XL LVL3 (GOWN DISPOSABLE) ×2 IMPLANT
GOWN STRL REUS W/TWL 2XL LVL3 (GOWN DISPOSABLE) IMPLANT
GOWN STRL REUS W/TWL LRG LVL3 (GOWN DISPOSABLE) ×1
GOWN STRL REUS W/TWL XL LVL3 (GOWN DISPOSABLE) ×3
HEMOSTAT POWDER KIT SURGIFOAM (HEMOSTASIS) IMPLANT
KIT BASIN OR (CUSTOM PROCEDURE TRAY) ×1 IMPLANT
KIT TURNOVER KIT B (KITS) ×1 IMPLANT
NDL SPNL 22GX3.5 QUINCKE BK (NEEDLE) ×1 IMPLANT
NEEDLE HYPO 22GX1.5 SAFETY (NEEDLE) ×1 IMPLANT
NEEDLE SPNL 22GX3.5 QUINCKE BK (NEEDLE) IMPLANT
NS IRRIG 1000ML POUR BTL (IV SOLUTION) ×1 IMPLANT
PACK LAMINECTOMY NEURO (CUSTOM PROCEDURE TRAY) ×1 IMPLANT
SPIKE FLUID TRANSFER (MISCELLANEOUS) ×1 IMPLANT
SPONGE SURGIFOAM ABS GEL SZ50 (HEMOSTASIS) ×1 IMPLANT
STRIP CLOSURE SKIN 1/2X4 (GAUZE/BANDAGES/DRESSINGS) ×1 IMPLANT
SUT VIC AB 0 CT1 18XCR BRD8 (SUTURE) ×1 IMPLANT
SUT VIC AB 0 CT1 8-18 (SUTURE) ×1
SUT VIC AB 2-0 CT1 18 (SUTURE) ×1 IMPLANT
SUT VICRYL 4-0 PS2 18IN ABS (SUTURE) ×1 IMPLANT
TOWEL GREEN STERILE (TOWEL DISPOSABLE) ×1 IMPLANT
TOWEL GREEN STERILE FF (TOWEL DISPOSABLE) ×1 IMPLANT
WATER STERILE IRR 1000ML POUR (IV SOLUTION) ×1 IMPLANT

## 2022-02-18 NOTE — Progress Notes (Signed)
Subjective: Patient reports patient still with back and left leg radicular pain  Objective: Vital signs in last 24 hours: Temp:  [98 F (36.7 C)-98.9 F (37.2 C)] 98 F (36.7 C) (10/20 0721) Pulse Rate:  [52-73] 52 (10/20 0721) Resp:  [16-18] 16 (10/20 0721) BP: (121-144)/(63-80) 141/79 (10/20 0721) SpO2:  [92 %-93 %] 93 % (10/20 0721)  Intake/Output from previous day: No intake/output data recorded. Intake/Output this shift: No intake/output data recorded.  Strength 5 out of 5 incision clean dry and intact  Lab Results: No results for input(s): "WBC", "HGB", "HCT", "PLT" in the last 72 hours. BMET No results for input(s): "NA", "K", "CL", "CO2", "GLUCOSE", "BUN", "CREATININE", "CALCIUM" in the last 72 hours.  Studies/Results: MR Lumbar Spine W Wo Contrast  Result Date: 02/17/2022 CLINICAL DATA:  One day postop lumbar laminectomy with severe left leg pain and numbness. EXAM: MRI LUMBAR SPINE WITHOUT AND WITH CONTRAST TECHNIQUE: Multiplanar and multiecho pulse sequences of the lumbar spine were obtained without and with intravenous contrast. CONTRAST:  66m GADAVIST GADOBUTROL 1 MMOL/ML IV SOLN COMPARISON:  Lumbar MRI 12/28/2021 FINDINGS: Segmentation: There are five lumbar type vertebral bodies. The last full intervertebral disc space is labeled L5-S1. Alignment:  Normal Vertebrae:  Normal marrow signal. No bone lesions or fractures. Conus medullaris and cauda equina: Conus extends to the T12-L1 level. Conus and cauda equina appear normal. Paraspinal and other soft tissues: No significant paraspinal or retroperitoneal findings. Disc levels: T12-L1: Stable left paracentral disc protrusion. L1-2: Stable focal central and slightly left paracentral disc protrusion with mass effect on the ventral thecal sac. L2-3: Stable left paracentral and left foraminal disc protrusion. This displaces the left L2 nerve root extra foraminally. L3-4: Small annular fissures and diffuse bulging annulus. Mild  stable bilateral lateral recess stenosis. L4-5: Left-sided laminectomy defect noted. There is a large postoperative fluid collection in the laminectomy bed and in the spinal canal. This extends all the way down to the L5-S1 laminectomy site with significant mass effect on the thecal sac at this level also. This is most consistent with a postoperative hematoma. L5-S1: Left-sided laminectomy defect with left posterolateral hematoma continues with a hematoma at the level above. IMPRESSION: 1. Large postoperative hematoma extending from the midbody of L4 down to the upper aspect of S1. There is significant mass effect on the thecal sac which is severely compressed anteriorly and to the right at both levels. 2. Stable disc protrusions at T12-L1 and L1-2. These results will be called to the ordering clinician or representative by the Radiologist Assistant, and communication documented in the PACS or [employer] Electronically Signed   By: PMarijo SanesM.D.   On: 02/17/2022 20:41    Assessment/Plan: Postop day 2 follow-up MRI scan last night does show severe thecal sac and nerve root compression from what appears to be hematoma at the operative site.  Due to patient's progression of clinical syndrome imaging findings I have recommended reexploration of lumbar wound for evacuation of hematoma.  I extensively gone over the risks and benefits of that procedure with her as well as perioperative course expectations of outcome and alternatives of surgery and she understands and agrees to proceed forward.  LOS: 0 days     GElaina Hoops10/20/2023, 10:33 AM

## 2022-02-18 NOTE — Progress Notes (Signed)
OT Cancellation Note  Patient Details Name: SHEZA STRICKLAND MRN: [id number] DOB: [birth year]   Cancelled Treatment:    Reason Eval/Treat Not Completed: Other (comment) (RN reporting pt with hematoma and going in for revision at 12:15. Will return after revision)  Shanda Howells, OTR/L [facility] Acute Rehabilitation Office: [phone]   Lula Olszewski 02/18/2022, 10:25 AM

## 2022-02-18 NOTE — Transfer of Care (Signed)
Immediate Anesthesia Transfer of Care Note  Patient: Erin Zamora  Procedure(s) Performed: Lumbar Five - Sacral One re-exploration of diskectomy (Back)  Patient Location: PACU  Anesthesia Type:General  Level of Consciousness: unresponsive and Patient remains intubated per anesthesia plan  Airway & Oxygen Therapy: Patient remains intubated per anesthesia plan and Patient placed on Ventilator (see vital sign flow sheet for setting)  Post-op Assessment: Report given to RN and Post -op Vital signs reviewed and stable  Post vital signs: Reviewed and stable  Last Vitals:  Vitals Value Taken Time  BP 137/82 02/18/22 1430  Temp    Pulse 80 02/18/22 1431  Resp 18 02/18/22 1431  SpO2 96 % 02/18/22 1431  Vitals shown include unvalidated device data.  Last Pain:  Vitals:   02/18/22 1203  TempSrc:   PainSc: 7       Patients Stated Pain Goal: 3 (47/07/61 5183)  Complications: No notable events documented.

## 2022-02-18 NOTE — Op Note (Signed)
Preoperative diagnosis: Wound epidural hematoma for 5 L5-S1 and possible recurrent herniated nucleus pulposus L5-S1  Postoperative diagnosis: Same with recurrent/retained fragment at L5-S1  Procedure: Reexploration of lumbar wound for evacuation of lumbar wound epidural hematoma and redo microdiscectomy L5-S1 in the left.  Surgeon: Kary Kos.  Assistant: Nash Shearer.  Anesthesia: General.  EBL: Minimal   HPI: 51 year old female underwent decompressive lumbar laminectomy L4-5 and lumbar microdiscectomy L5-S1 on the left on Wednesday did well for the first 24 hours but woke up Thursday morning experienced sudden onset acute left leg pain consistent with an S1 nerve root pattern.  Patient was placed on steroids initially did not respond repeat MRI scan showed severe thecal sac compression from an epidural hematoma and possible recurrent/retained disc at L5-S1.  Due to patient's progression of clinical syndrome imaging findings failed conservative treatment I recommended reexploration lumbar wound for evacuation of hematoma and possible redo discectomy.  I extensively went over the risks and benefits of that operation with her as well as perioperative course expectations of outcome and alternatives of surgery and she understood and agreed to proceed forward.  Operative procedure: Patient was brought into the OR was induced under general anesthesia positioned prone Wilson frame her back was prepped and draped in routine sterile fashion.  Roll incision was opened up immediate old blood under pressure was evacuated Gelfoam was removed immediately and visualize the thecal sac and removed all the compressive hematoma then the operating microscope was draped and brought into the field under microscope illumination the S1 nerve root was identified and reflected medially there was a large fragment still partially contained with the ligament superior to the disc base that was either recurrent or retained this  was all removed with a nerve hook disc base was further incised and cleaned out again and at the end discectomies no further stenosis at L5-S1 all the hematoma was evacuated thecal sac was widely decompressed at both L4-5 and L5-S1.  I did place a medium Hemovac drain and injected Marcaine in the fascia and closed the wound in layers with interrupted Vicryl and a running 4 subcuticular.  Dermabond benzoin Steri-Strips and a sterile dressing was applied patient recovery in stable condition.  At the end the case all needle counts and sponge counts were correct.

## 2022-02-18 NOTE — Anesthesia Procedure Notes (Signed)
Procedure Name: Intubation Date/Time: 02/18/2022 12:40 PM  Performed by: Elvin So, CRNAPre-anesthesia Checklist: Patient identified, Emergency Drugs available, Suction available and Patient being monitored Patient Re-evaluated:Patient Re-evaluated prior to induction Oxygen Delivery Method: Circle System Utilized Preoxygenation: Pre-oxygenation with 100% oxygen Induction Type: IV induction Ventilation: Mask ventilation without difficulty Laryngoscope Size: Mac and 4 Grade View: Grade I Tube type: Oral Tube size: 7.0 mm Number of attempts: 1 Airway Equipment and Method: Stylet and Oral airway Placement Confirmation: ETT inserted through vocal cords under direct vision, positive ETCO2 and breath sounds checked- equal and bilateral Secured at: 22 cm Tube secured with: Tape Dental Injury: Teeth and Oropharynx as per pre-operative assessment

## 2022-02-18 NOTE — Anesthesia Preprocedure Evaluation (Addendum)
Anesthesia Evaluation  Patient identified by MRN, date of birth, ID band Patient awake    Reviewed: Allergy & Precautions, NPO status , Patient's Chart, lab work & pertinent test results  Airway Mallampati: III  TM Distance: >3 FB Neck ROM: Full    Dental no notable dental hx.    Pulmonary COPD,  COPD inhaler, Current Smoker and Patient abstained from smoking.   Pulmonary exam normal        Cardiovascular hypertension, Pt. on medications Normal cardiovascular exam     Neuro/Psych  PSYCHIATRIC DISORDERS Anxiety Depression     Neuromuscular disease    GI/Hepatic negative GI ROS, Neg liver ROS,,,  Endo/Other  negative endocrine ROS    Renal/GU negative Renal ROS     Musculoskeletal  (+)  Fibromyalgia -  Abdominal   Peds  Hematology negative hematology ROS (+)   Anesthesia Other Findings HNP  Reproductive/Obstetrics hcg negative                             Anesthesia Physical Anesthesia Plan  ASA: 3  Anesthesia Plan: General   Post-op Pain Management:    Induction: Intravenous  PONV Risk Score and Plan: 2 and Ondansetron, Dexamethasone, Midazolam and Treatment may vary due to age or medical condition  Airway Management Planned: Oral ETT  Additional Equipment:   Intra-op Plan:   Post-operative Plan: Extubation in OR  Informed Consent: I have reviewed the patients History and Physical, chart, labs and discussed the procedure including the risks, benefits and alternatives for the proposed anesthesia with the patient or authorized representative who has indicated his/her understanding and acceptance.     Dental advisory given  Plan Discussed with: CRNA  Anesthesia Plan Comments:         Anesthesia Quick Evaluation

## 2022-02-19 MED ORDER — METHOCARBAMOL 500 MG PO TABS: 500.0000 mg | ORAL_TABLET | Freq: Four times a day (QID) | ORAL | 1 refills | Status: DC

## 2022-02-19 MED ORDER — HYDROCODONE-ACETAMINOPHEN 5-325 MG PO TABS: 1.0000 | ORAL_TABLET | Freq: Four times a day (QID) | ORAL | 0 refills | Status: DC | PRN

## 2022-02-19 NOTE — Evaluation (Signed)
Occupational Therapy Evaluation Patient Details Name: Erin Zamora MRN: [id number] DOB: [birth year] Today's Date: 02/19/2022   History of Present Illness Pt is a 51 y/o female adm 02/16/22 for  microdiscectomy on the left at L5-S1 and L4-5 on 02/16/2022. She did well initially postoperatively. 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 PMH anxiety, COPD, fibromyalgia, HTN   Clinical Impression   Pt admitted with the above diagnoses and presents with below problem list. Pt will benefit from continued acute OT to address the below listed deficits and maximize independence with basic ADLs prior to d/c home. At baseline, pt is independent with ADLs. Pt is currently min guard with LB ADLs. Increased pain with mobility.        Recommendations for follow up therapy are one component of a multi-disciplinary discharge planning process, led by the attending physician.  Recommendations may be updated based on patient status, additional functional criteria and insurance authorization.   Follow Up Recommendations  No OT follow up    Assistance Recommended at Discharge Intermittent Supervision/Assistance  Patient can return home with the following      Functional Status Assessment  Patient has had a recent decline in their functional status and demonstrates the ability to make significant improvements in function in a reasonable and predictable amount of time.  Equipment Recommendations  BSC/3in1    Recommendations for Other Services       Precautions / Restrictions Precautions Precautions: Back Precaution Booklet Issued: Yes (comment) Precaution Comments: Reviewed handout and pt was cued for precautions during functional mobility. Restrictions Weight Bearing Restrictions: No       Mobility Bed Mobility Overal bed mobility: Modified Independent                  Transfers Overall transfer level: Needs assistance Equipment used: None Transfers: Sit to/from Stand Sit to Stand: Min guard           General transfer comment: to/from EOB and BSC over toilet.      Balance Overall balance assessment: Mild deficits observed, not formally tested                                         ADL either performed or assessed with clinical judgement   ADL Overall ADL's : Needs assistance/impaired Eating/Feeding: Set up   Grooming: Set up;Sitting;Min guard;Standing   Upper Body Bathing: Set up;Sitting   Lower Body Bathing: Min guard;Sit to/from stand   Upper Body Dressing : Set up;Sitting   Lower Body Dressing: Min guard;Sit to/from stand   Toilet Transfer: Min guard;Ambulation;BSC/3in1   Toileting- Water quality scientist and Hygiene: Min guard;Sit to/from stand       Functional mobility during ADLs: Min guard;Rolling walker (2 wheels)       Vision         Perception     Praxis      Pertinent Vitals/Pain Pain Assessment Pain Assessment: Faces Faces Pain Scale: Hurts little more Pain Location: left buttock and posterior thigh Pain Descriptors / Indicators: Aching, Guarding, Heaviness, Numbness Pain Intervention(s): Monitored during session, Limited activity within patient's tolerance, Repositioned     Hand Dominance     Extremity/Trunk Assessment Upper Extremity Assessment Upper Extremity Assessment: Overall [facility]  for tasks assessed   Lower Extremity Assessment Lower Extremity Assessment: Defer to PT evaluation   Cervical / Trunk Assessment Cervical / Trunk Assessment: Back Surgery   Communication Communication Communication: No difficulties   Cognition Arousal/Alertness: Awake/alert Behavior During Therapy: WFL for tasks assessed/performed Overall Cognitive Status: Within Functional Limits for tasks  assessed                                       General Comments       Exercises     Shoulder Instructions      Home Living Family/patient expects to be discharged to:: Private residence Living Arrangements: Children;Spouse/significant other Available Help at Discharge: Family;Available PRN/intermittently Type of Home: Mobile home Home Access: Stairs to enter Entrance Stairs-Number of Steps: 4 Entrance Stairs-Rails: Right;Left;Can reach both Home Layout: One level         Bathroom Toilet: Standard     Home Equipment: Conservation officer, nature (2 wheels)   Additional Comments: RW was her husband's, thinks she may need to use to get up from low mattress      Prior Functioning/Environment Prior Level of Function : Independent/Modified Independent                        OT Problem List: Impaired balance (sitting and/or standing);Pain;Decreased knowledge of precautions;Decreased knowledge of use of DME or AE      OT Treatment/Interventions: Self-care/ADL training;DME and/or AE instruction;Therapeutic activities;Patient/family education;Balance training    OT Goals(Current goals can be found in the care plan section) Acute Rehab OT Goals Patient Stated Goal: reduce pain, regain independence OT Goal Formulation: With patient Time For Goal Achievement: 03/05/22 Potential to Achieve Goals: Good ADL Goals Pt Will Perform Lower Body Bathing: with modified independence;sit to/from stand Pt Will Perform Lower Body Dressing: with modified independence;sit to/from stand Pt Will Transfer to Toilet: with modified independence;ambulating Pt Will Perform Toileting - Clothing Manipulation and hygiene: with modified independence;sit to/from stand  OT Frequency: Min 2X/week    Co-evaluation              AM-PAC OT "6 Clicks" Daily Activity     Outcome Measure Help from another person eating meals?: None Help from another person taking care of personal grooming?:  None Help from another person toileting, which includes using toliet, bedpan, or urinal?: A Little Help from another person bathing (including washing, rinsing, drying)?: A Little Help from another person to put on and taking off regular upper body clothing?: None Help from another person to put on and taking off regular lower body clothing?: A Little 6 Click Score: 21   End of Session Nurse Communication: Other (comment) (pain level and mobility status)  Activity Tolerance: Patient limited by pain Patient left: Other (comment) (with PT)  OT Visit Diagnosis: Unsteadiness on feet (R26.81);Pain                Time: 8588-5027 OT Time Calculation (min): 20 min Charges:  OT General Charges $OT Visit: 1 Visit OT Evaluation $OT Eval Low Complexity: [place], OT Acute Rehabilitation Services Office: [phone]   Hortencia Pilar 02/19/2022, 2:30 PM

## 2022-02-19 NOTE — Progress Notes (Signed)
OT Cancellation Note  Patient Details Name: Erin Zamora MRN: [id number] DOB: [birth year]   Cancelled Treatment:    Reason Eval/Treat Not Completed: Other (comment). Pt just finished working with PT. High level of pain and needing to recover for a bit per PT. Plan to reattempt at a later time.   Tyrone Schimke, OT Acute Rehabilitation Services Office: [phone]   Erin Zamora 02/19/2022, 9:05 AM

## 2022-02-19 NOTE — Discharge Summary (Signed)
  Physician Discharge Summary  Patient ID: Erin Zamora MRN: [id number] DOB/AGE: 1971/02/29 51 y.o.  Admit date: 02/16/2022 Discharge date: 02/19/2022  Admission Diagnoses:  Left L5-S1 herniated nucleus pulposus with radiculopathy  Discharge Diagnoses:  Same Principal Problem:   HNP (herniated nucleus pulposus), lumbar Hematoma, wound Recurrent left L5-S1 herniated nucleus pulposus  Discharged Condition: Stable  Hospital Course:  Erin Zamora is a 51 y.o. female that underwent a microdiscectomy on the left at L5-S1 and L4-5 on 02/16/2022.  She did well initially postoperatively.  Then began having severe pain again, work-up revealed recurrent herniated nucleus pulposus with epidural hematoma and therefore she was taken back to surgery for removal of recurrent herniated nucleus pulposus and evacuation of hematoma.  She tolerated this well, postoperatively her pain was improved.  She is ambulating independently.  She was having normal bowel bladder function.  Her pain was controlled on oral medication.  Wound was clean dry and intact.  Treatments: Surgery -L4-5, L5-S1 decompression, evacuation of hematoma, recurrent L5-S1 decompression on the left  Discharge Exam: Blood pressure 134/72, pulse 63, temperature 98.2 F (36.8 C), temperature source Oral, resp. rate 20, height '5\' 5"'$  (1.651 m), weight 108.6 kg, last menstrual period 10/30/2021, SpO2 92 %. Awake, alert, oriented x3 PERRLA Speech fluent, appropriate CN grossly intact 5/5 BUE/BLE Wound c/d/i  Disposition: Discharge disposition: 01-Home or Self Care        Allergies as of 02/19/2022       Reactions   Codeine Itching, Nausea And Vomiting        Medication List     TAKE these medications    acetaminophen 325 MG tablet Commonly known as: Tylenol Take 2 tablets (650 mg total) by mouth every 6 (six) hours as needed.   albuterol 108 (90 Base) MCG/ACT inhaler Commonly known as: VENTOLIN  HFA Inhale 1-2 puffs into the lungs every 6 (six) hours as needed for wheezing or shortness of breath.   gabapentin 300 MG capsule Commonly known as: NEURONTIN Take 1 capsule (300 mg total) by mouth 3 (three) times daily.   HYDROcodone-acetaminophen 5-325 MG tablet Commonly known as: NORCO/VICODIN Take 1-2 tablets by mouth every 6 (six) hours as needed for severe pain ((score 7 to 10)).   ibuprofen 200 MG tablet Commonly known as: ADVIL Take 800 mg by mouth every 4 (four) hours as needed for moderate pain.   lisinopril 40 MG tablet Commonly known as: ZESTRIL Take 1 tablet (40 mg total) by mouth daily.   methocarbamol 500 MG tablet Commonly known as: ROBAXIN Take 1 tablet (500 mg total) by mouth 4 (four) times daily.   potassium chloride SA 20 MEQ tablet Commonly known as: KLOR-CON M Take 1 tablet (20 mEq total) by mouth daily.   sertraline 100 MG tablet Commonly known as: ZOLOFT Take 1 tablet (100 mg total) by mouth daily.   Vitamin D (Ergocalciferol) 1.25 MG (50000 UNIT) Caps capsule Commonly known as: DRISDOL Take 1 capsule (50,000 Units total) by mouth every 7 (seven) days.        Follow-up Information     Kary Kos, MD Follow up in 2 week(s).   Specialty: Neurosurgery Contact information: [address] [place] [address] [phone]                 Signed: Theodoro Doing Boni Maclellan 02/19/2022, 7:57 AM

## 2022-02-19 NOTE — Anesthesia Postprocedure Evaluation (Signed)
Anesthesia Post Note  Patient: Erin Zamora  Procedure(s) Performed: Lumbar Five - Sacral One re-exploration of diskectomy (Back)     Patient location during evaluation: PACU Anesthesia Type: General Level of consciousness: awake Pain management: pain level controlled Vital Signs Assessment: post-procedure vital signs reviewed and stable Respiratory status: spontaneous breathing, nonlabored ventilation, respiratory function stable and patient connected to nasal cannula oxygen Cardiovascular status: blood pressure returned to baseline and stable Postop Assessment: no apparent nausea or vomiting Anesthetic complications: yes (PACU extubation) Comments: Unable to extubate patient in the operating room despite multiple attempts. This was due to coughing and breath holding with resultant desaturation and hypoventilation with resultant hypercardia and somnolence. Decision made to transport patient to the PACU. Patient slowly weaned from sedation and ventilatory support and extubated in the PACU. Surgeon updated.      No notable events documented.  Last Vitals:  Vitals:   02/18/22 2357 02/19/22 0429  BP:  134/72  Pulse:  63  Resp: 18 20  Temp:  36.8 C  SpO2:  92%    Last Pain:  Vitals:   02/19/22 0429  TempSrc: Oral  PainSc: 7                  Divina Neale P Isel Skufca

## 2022-02-19 NOTE — Progress Notes (Signed)
Patient is discharged from room 3C04 at this time. Alert and in stable condition. IV site d/c'd and instructions read to patient and daughter with understanding verbalized and all questions answered. Left unit via wheelchair with all belongings at side.

## 2022-02-19 NOTE — Evaluation (Signed)
Physical Therapy Re-Evaluation  Patient Details Name: Erin Zamora MRN: [id number] DOB: Oct 07, 1970 Today's Date: 02/19/2022  History of Present Illness  Pt is a 51 y/o female adm 02/16/22 for  microdiscectomy on the left at L5-S1 and L4-5 on 02/16/2022. She did well initially postoperatively. 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 PMH anxiety, COPD, fibromyalgia, HTN   Clinical Impression  Pt admitted with above diagnosis. At the time of PT eval, pt was able to demonstrate transfers and ambulation with gross min guard assist, mainly due to pain and unsteadiness due to pain. Pt was educated on precautions, but education was limited due to increased pain and pt unable to tolerate OOB mobility once up. Anticipate pt will progress well as pain is controlled. Pt currently with functional limitations due to the deficits listed below (see PT Problem List). Pt will benefit from skilled PT to increase their independence and safety with mobility to allow discharge to the venue listed below.         Recommendations for follow up therapy are one component of a multi-disciplinary discharge planning process, led by the attending physician.  Recommendations may be updated based on patient status, additional functional criteria and insurance authorization.  Follow Up Recommendations No PT follow up      Assistance Recommended at Discharge PRN  Patient can return home with the following  A little help with walking and/or transfers;A little help with bathing/dressing/bathroom;Assistance with cooking/housework;Assist for transportation;Help with stairs or ramp for entrance    Equipment Recommendations BSC/3in1  Recommendations for Other Services       Functional Status Assessment Patient  has had a recent decline in their functional status and demonstrates the ability to make significant improvements in function in a reasonable and predictable amount of time.     Precautions / Restrictions Precautions Precautions: Back Precaution Booklet Issued: Yes (comment) Precaution Comments: Reviewed handout and pt was cued for precautions during functional mobility. Restrictions Weight Bearing Restrictions: No      Mobility  Bed Mobility Overal bed mobility: Modified Independent             General bed mobility comments: Increased time and use of railing to return to supine. Pt was able to transition to EOB without rail but required HOB to be elevated as she was experiencing extreme pain while HOB was being lowered. Unable to tolerate fully lowered HOB.    Transfers Overall transfer level: Needs assistance Equipment used: None Transfers: Sit to/from Stand Sit to Stand: Min guard           General transfer comment: Close guard for safety as pt powered up to full stand. Increased time and significantly widened BOS required for sit<>stand.    Ambulation/Gait Ambulation/Gait assistance: Min guard Gait Distance (Feet): 40 Feet Assistive device:  (Railing in hall) Gait Pattern/deviations: Step-to pattern, Decreased stance time - left, Decreased weight shift to left, Trunk flexed Gait velocity: Decreased Gait velocity interpretation: <1.31 ft/sec, indicative of household ambulator   General Gait Details: Pt reports not being able to put full weight on her LLE and with heavy lean on railing in hall. Pt with effortful steps and slow pace. Distance limited by pain; pt crying and calling out in the hallway due to pain, however she was able to make it back to the room  for a seated rest break.  Stairs            Wheelchair Mobility    Modified Rankin (Stroke Patients Only)       Balance Overall balance assessment: Mild deficits observed, not formally tested                                            Pertinent Vitals/Pain Pain Assessment Pain Assessment: Faces Faces Pain Scale: Hurts worst Pain Location: left buttock and posterior thigh Pain Descriptors / Indicators: Aching, Guarding, Heaviness, Numbness Pain Intervention(s): Limited activity within patient's tolerance, Monitored during session, Repositioned    Home Living Family/patient expects to be discharged to:: Private residence Living Arrangements: Children;Spouse/significant other Available Help at Discharge: Family;Available PRN/intermittently Type of Home: Mobile home Home Access: Stairs to enter Entrance Stairs-Rails: Right;Left;Can reach both Entrance Stairs-Number of Steps: 4   Home Layout: One level Home Equipment: Conservation officer, nature (2 wheels)      Prior Function Prior Level of Function : Independent/Modified Independent                     Hand Dominance        Extremity/Trunk Assessment   Upper Extremity Assessment Upper Extremity Assessment: Overall WFL for tasks assessed    Lower Extremity Assessment Lower Extremity Assessment: LLE deficits/detail LLE Deficits / Details: Decreased strength and AROM, reporting pain from L buttock down to knee. Pain extends to L heel with knee extension.    Cervical / Trunk Assessment Cervical / Trunk Assessment: Back Surgery  Communication   Communication: No difficulties  Cognition Arousal/Alertness: Awake/alert Behavior During Therapy: WFL for tasks assessed/performed Overall Cognitive Status: Within Functional Limits for tasks assessed                                          General Comments      Exercises     Assessment/Plan    PT Assessment Patient needs continued PT services  PT Problem List Decreased strength;Decreased activity tolerance;Decreased balance;Decreased mobility;Decreased knowledge of use of DME;Decreased safety awareness;Decreased knowledge of  precautions;Pain       PT Treatment Interventions DME instruction;Gait training;Stair training;Functional mobility training;Therapeutic activities;Therapeutic exercise;Balance training;Patient/family education    PT Goals (Current goals can be found in the Care Plan section)  Acute Rehab PT Goals Patient Stated Goal: decr LLE pain PT Goal Formulation: With patient    Frequency Min 5X/week     Co-evaluation               AM-PAC PT "6 Clicks" Mobility  Outcome Measure Help needed turning from your back to your side while in a flat bed without using bedrails?: None Help needed moving from lying on your back to sitting on the side of a flat bed without using bedrails?: A Little Help needed moving to and from a bed to a chair (including a wheelchair)?: A Little Help needed standing up from a chair using your arms (e.g., wheelchair or bedside chair)?: A Little Help needed to walk in hospital room?: A Little Help needed climbing 3-5 steps with a railing? : A Little 6 Click Score: 19    End of Session Equipment Utilized During Treatment: Gait belt Activity Tolerance: Patient limited by pain Patient left: in bed;with  call bell/phone within reach Nurse Communication: Mobility status;Other (comment) (incr pain despite premedication) PT Visit Diagnosis: Difficulty in walking, not elsewhere classified (R26.2);Pain;Unsteadiness on feet (R26.81) Pain - part of body:  (back)    Time: 0289-0228 PT Time Calculation (min) (ACUTE ONLY): 20 min   Charges:   PT Evaluation $PT Re-evaluation: 1 Re-eval          Rolinda Roan, PT, DPT Acute Rehabilitation Services Secure Chat Preferred Office: [phone]   [name] 02/19/2022, 9:29 AM

## 2022-02-19 NOTE — Progress Notes (Signed)
Physical Therapy Treatment Patient Details Name: Erin Zamora MRN: [id number] DOB: Jul 17, 1970 Today's Date: 02/19/2022   History of Present Illness Pt is a 51 y/o female adm 02/16/22 for  microdiscectomy on the left at L5-S1 and L4-5 on 02/16/2022. She did well initially postoperatively. 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 PMH anxiety, COPD, fibromyalgia, HTN    PT Comments    Pt progressing towards physical therapy goals. Was able to perform transfers and ambulation with gross min guard assist, however states the RW is helpful for pain control. Reinforced education on precautions, positioning recommendations, car transfer, and appropriate activity progression. Pt anticipates d/c home today. Will continue to follow.     Recommendations for follow up therapy are one component of a multi-disciplinary discharge planning process, led by the attending physician.  Recommendations may be updated based on patient status, additional functional criteria and insurance authorization.  Follow Up Recommendations  No PT follow up     Assistance Recommended at Discharge PRN  Patient can return home with the following A little help with walking and/or transfers;A little help with bathing/dressing/bathroom;Assistance with cooking/housework;Assist for transportation;Help with stairs or ramp for entrance   Equipment Recommendations  BSC/3in1    Recommendations for Other Services OT consult (RN to place order)     Precautions / Restrictions Precautions Precautions: Back Precaution Booklet Issued: Yes (comment) Precaution Comments: Reviewed handout and pt was cued for precautions during functional mobility. Restrictions Weight Bearing Restrictions: No     Mobility  Bed  Mobility Overal bed mobility: Modified Independent             General bed mobility comments: Pt was received exiting bathroom with OT present    Transfers Overall transfer level: Needs assistance Equipment used: None Transfers: Sit to/from Stand Sit to Stand: Min guard           General transfer comment: Close guard for safety as pt powered up to full stand. Increased time and significantly widened BOS required for sit<>stand.    Ambulation/Gait Ambulation/Gait assistance: Min guard Gait Distance (Feet): 150 Feet Assistive device: Rolling walker (2 wheels) Gait Pattern/deviations: Decreased stance time - left, Decreased weight shift to left, Trunk flexed, Step-through pattern Gait velocity: Decreased Gait velocity interpretation: <1.8 ft/sec, indicate of risk for recurrent falls   General Gait Details: Pt was able to ambulate in the hall with Close guard for safety and RW for support. Pt states walker is helpful to offweight the LLE, however still feels as though her leg is "detaching from the hip". Appeared to tolerate well without any significant antalgia and no knee buckling noted.   Stairs Stairs: Yes Stairs assistance: Min guard Stair Management: Two rails, Step to pattern, Forwards Number of Stairs: 3 General stair comments: VC's for sequencing and general safety. Pt led up with RLE however could not tolerate stepping down with LLE due to increased pain with LLE being off weighted. No knee buckle noted with descent leading with RLE.   Wheelchair Mobility    Modified Rankin (Stroke Patients Only)       Balance Overall balance assessment: Mild deficits observed, not formally tested  Cognition Arousal/Alertness: Awake/alert Behavior During Therapy: WFL for tasks assessed/performed Overall Cognitive Status: Within Functional Limits for tasks assessed                                           Exercises      General Comments        Pertinent Vitals/Pain Pain Assessment Pain Assessment: Faces Faces Pain Scale: Hurts little more Pain Location: left buttock and posterior thigh Pain Descriptors / Indicators: Aching, Guarding, Heaviness, Numbness Pain Intervention(s): Limited activity within patient's tolerance, Monitored during session, Repositioned    Home Living                          Prior Function            PT Goals (current goals can now be found in the care plan section) Acute Rehab PT Goals Patient Stated Goal: decr LLE pain PT Goal Formulation: With patient Progress towards PT goals: Progressing toward goals    Frequency    Min 5X/week      PT Plan Current plan remains appropriate    Co-evaluation              AM-PAC PT "6 Clicks" Mobility   Outcome Measure  Help needed turning from your back to your side while in a flat bed without using bedrails?: None Help needed moving from lying on your back to sitting on the side of a flat bed without using bedrails?: A Little Help needed moving to and from a bed to a chair (including a wheelchair)?: A Little Help needed standing up from a chair using your arms (e.g., wheelchair or bedside chair)?: A Little Help needed to walk in hospital room?: A Little Help needed climbing 3-5 steps with a railing? : A Little 6 Click Score: 19    End of Session Equipment Utilized During Treatment: Gait belt Activity Tolerance: Patient limited by pain Patient left: in bed;with call bell/phone within reach Nurse Communication: Mobility status PT Visit Diagnosis: Difficulty in walking, not elsewhere classified (R26.2);Pain;Unsteadiness on feet (R26.81) Pain - part of body:  (back)     Time: 1100-1130 PT Time Calculation (min) (ACUTE ONLY): 30 min  Charges:  $Gait Training: 23-37 mins                     Erin Zamora, PT, DPT Acute Rehabilitation Services Secure Chat  Preferred Office: [phone]    Erin Zamora 02/19/2022, 1:20 PM

## 2022-02-19 NOTE — Discharge Instructions (Signed)
Wound Care  Keep the incision clean and dry remove the outer dressing in 3 days, leave the Steri-Strips intact. Wrap with Saran wrap for showers only Do not put any creams, lotions, or ointments on incision. Leave steri-strips on back.  They will fall off by themselves.  Activity Walk each and every day, increasing distance each day. No lifting greater than 5 lbs.  No lifting no bending no twisting no driving or riding a car unless coming back and forth to see me. If provided with back brace, wear when out of bed.  It is not necessary to wear brace in bed. Diet Resume your normal diet.   Return to Work Will be discussed at you follow up appointment.  Call Your Doctor If Any of These Occur Redness, drainage, or swelling at the wound.  Temperature greater than 101 degrees. Severe pain not relieved by pain medication. Incision starts to come apart. Follow Up Appt Call  [phone])  for problems.

## 2022-02-20 ENCOUNTER — Encounter (HOSPITAL_COMMUNITY): Payer: Self-pay | Admitting: Neurosurgery

## 2022-02-21 ENCOUNTER — Encounter: Payer: Self-pay | Admitting: *Deleted

## 2022-02-21 MED FILL — Thrombin For Soln 5000 Unit: CUTANEOUS | Qty: 2 | Status: AC

## 2022-03-14 ENCOUNTER — Inpatient Hospital Stay: Payer: 59 | Attending: Oncology

## 2022-03-14 DIAGNOSIS — F17210 Nicotine dependence, cigarettes, uncomplicated: Secondary | ICD-10-CM | POA: Insufficient documentation

## 2022-03-14 DIAGNOSIS — D509 Iron deficiency anemia, unspecified: Secondary | ICD-10-CM | POA: Insufficient documentation

## 2022-03-14 DIAGNOSIS — D72829 Elevated white blood cell count, unspecified: Secondary | ICD-10-CM | POA: Insufficient documentation

## 2022-03-14 DIAGNOSIS — D75839 Thrombocytosis, unspecified: Secondary | ICD-10-CM | POA: Insufficient documentation

## 2022-03-14 DIAGNOSIS — D751 Secondary polycythemia: Secondary | ICD-10-CM | POA: Insufficient documentation

## 2022-03-14 DIAGNOSIS — I10 Essential (primary) hypertension: Secondary | ICD-10-CM | POA: Insufficient documentation

## 2022-03-15 ENCOUNTER — Telehealth: Payer: Self-pay | Admitting: *Deleted

## 2022-03-15 ENCOUNTER — Inpatient Hospital Stay: Payer: 59 | Admitting: Oncology

## 2022-03-15 NOTE — Telephone Encounter (Signed)
RN spoke with pt regarding mychart visit scheduled for 230 pm today.  Pt reports she did not get her labs yesterday.  She forgot about appointment and has recently had back surgery.  Pt reports she has appt today with surgeon for follow up.  Pt asks if she can be rescheduled for lab and mychart visit in 2 weeks.  RN stated she would send message to MD and scheduler to have appointment changed.  Pt verbalized understanding.

## 2022-03-28 ENCOUNTER — Inpatient Hospital Stay: Payer: 59

## 2022-03-28 DIAGNOSIS — D751 Secondary polycythemia: Secondary | ICD-10-CM | POA: Diagnosis not present

## 2022-03-28 DIAGNOSIS — D509 Iron deficiency anemia, unspecified: Secondary | ICD-10-CM | POA: Diagnosis present

## 2022-03-28 DIAGNOSIS — D72829 Elevated white blood cell count, unspecified: Secondary | ICD-10-CM | POA: Diagnosis not present

## 2022-03-28 DIAGNOSIS — D75839 Thrombocytosis, unspecified: Secondary | ICD-10-CM | POA: Diagnosis not present

## 2022-03-28 DIAGNOSIS — F17210 Nicotine dependence, cigarettes, uncomplicated: Secondary | ICD-10-CM | POA: Diagnosis not present

## 2022-03-28 DIAGNOSIS — I10 Essential (primary) hypertension: Secondary | ICD-10-CM | POA: Diagnosis not present

## 2022-03-28 LAB — CBC WITH DIFFERENTIAL/PLATELET
Abs Immature Granulocytes: 0.07 10*3/uL (ref 0.00–0.07)
Basophils Absolute: 0.1 10*3/uL (ref 0.0–0.1)
Basophils Relative: 1 %
Eosinophils Absolute: 0.3 10*3/uL (ref 0.0–0.5)
Eosinophils Relative: 2 %
HCT: 45.1 % (ref 36.0–46.0)
Hemoglobin: 15.2 g/dL — ABNORMAL HIGH (ref 12.0–15.0)
Immature Granulocytes: 1 %
Lymphocytes Relative: 23 %
Lymphs Abs: 2.6 10*3/uL (ref 0.7–4.0)
MCH: 29.6 pg (ref 26.0–34.0)
MCHC: 33.7 g/dL (ref 30.0–36.0)
MCV: 87.7 fL (ref 80.0–100.0)
Monocytes Absolute: 0.8 10*3/uL (ref 0.1–1.0)
Monocytes Relative: 7 %
Neutro Abs: 7.5 10*3/uL (ref 1.7–7.7)
Neutrophils Relative %: 66 %
Platelets: 528 10*3/uL — ABNORMAL HIGH (ref 150–400)
RBC: 5.14 MIL/uL — ABNORMAL HIGH (ref 3.87–5.11)
RDW: 13.9 % (ref 11.5–15.5)
WBC: 11.3 10*3/uL — ABNORMAL HIGH (ref 4.0–10.5)
nRBC: 0 % (ref 0.0–0.2)

## 2022-03-28 LAB — IRON AND TIBC
Iron: 78 ug/dL (ref 28–170)
Saturation Ratios: 16 % (ref 10.4–31.8)
TIBC: 500 ug/dL — ABNORMAL HIGH (ref 250–450)
UIBC: 422 ug/dL

## 2022-03-28 LAB — FERRITIN: Ferritin: 20 ng/mL (ref 11–307)

## 2022-03-29 ENCOUNTER — Inpatient Hospital Stay (HOSPITAL_BASED_OUTPATIENT_CLINIC_OR_DEPARTMENT_OTHER): Payer: 59 | Admitting: Oncology

## 2022-03-29 ENCOUNTER — Encounter: Payer: Self-pay | Admitting: Oncology

## 2022-03-29 DIAGNOSIS — D509 Iron deficiency anemia, unspecified: Secondary | ICD-10-CM

## 2022-03-29 NOTE — Progress Notes (Signed)
No blood in stool. Some dyspnea with exertion may be due to anxiety. Patient is recovering from back surgery on the 18th and 20th. Developed a blood clot and pooling of blood in surgical area. Pt otherwise is doing okay.

## 2022-03-29 NOTE — Progress Notes (Signed)
[facility]  Telephone:([phone] [phone]  HEMATOLOGY-ONCOLOGY TELEMEDICINE VISIT PROGRESS NOTE  ID: [name] OB: Nov 06, 1970  MR#: [id number]  PPJ#:[id number]  Patient Care Team: Teena Dunk, NP as PCP - General (Nurse Practitioner) Lloyd Huger, MD as Consulting Physician (Oncology)  I connected with [name] on 03/29/22 at  3:15 PM EST by video enabled telemedicine visit and verified that I am speaking with the correct person using two identifiers.   I discussed the limitations, risks, security and privacy concerns of performing an evaluation and management service by telemedicine and the availability of in-person appointments. I also discussed with the patient that there may be a patient responsible charge related to this service. The patient expressed understanding and agreed to proceed.   Other persons participating in the visit and their role in the encounter: Patient, MD.  Patient's location: Home. Provider's location: Clinic.  CHIEF COMPLAINT: Thrombocytosis, iron deficiency anemia.  INTERVAL HISTORY: Patient agreed to video assisted telemedicine visit for further evaluation and discussion of her laboratory results.  She recently underwent back surgery at the end of October and is still having pain, but otherwise feels well.  She does not complain of any weakness or fatigue today.  She has no neurologic complaints.  She denies any recent fevers or illnesses.  She has a good appetite and denies weight loss.  She has no chest pain, shortness of breath, cough, or hemoptysis.  She denies any nausea, vomiting, constipation, or diarrhea.  She denies any melena or hematochezia.  She has no urinary complaints.  Patient offers no further specific complaints today.  REVIEW OF SYSTEMS:   Review of Systems  Constitutional: Negative.  Negative for fever, malaise/fatigue and weight loss.  Respiratory: Negative.  Negative  for cough, hemoptysis and shortness of breath.   Cardiovascular: Negative.  Negative for chest pain and leg swelling.  Gastrointestinal: Negative.  Negative for abdominal pain.  Genitourinary: Negative.  Negative for dysuria.  Musculoskeletal:  Positive for back pain.  Skin: Negative.  Negative for rash.  Neurological: Negative.  Negative for dizziness, focal weakness, weakness and headaches.  Psychiatric/Behavioral: Negative.  The patient is not nervous/anxious.     As per HPI. Otherwise, a complete review of systems is negative.  PAST MEDICAL HISTORY: Past Medical History:  Diagnosis Date   Allergy    seasonal   Anemia    Anxiety    CAP (community acquired pneumonia) 10/2018   COPD (chronic obstructive pulmonary disease) ([place])    Cough 10/2018   Dyspnea    Fibromyalgia    Hyperlipidemia 10/2018   Hypertension    Hypokalemia    Insomnia 10/2018   Microcytic anemia    Pre-diabetes    Skin lesions 10/2019   Thrombocytosis    Vitamin D deficiency 10/2018    PAST SURGICAL HISTORY: Past Surgical History:  Procedure Laterality Date   APPENDECTOMY  05/02/1989   CHOLECYSTECTOMY  05/02/2001   COLONOSCOPY WITH PROPOFOL N/A 05/18/2021   Procedure: COLONOSCOPY WITH PROPOFOL;  Surgeon: Jonathon Bellows, MD;  Location: [facility] ENDOSCOPY;  Service: Gastroenterology;  Laterality: N/A;   LUMBAR LAMINECTOMY/DECOMPRESSION MICRODISCECTOMY Left 02/16/2022   Procedure: Left - Lumbar Four-Lumbar Five laminectomy and foraminotomy, Left - Lumbar Five-Sacral One microdiskectomy;  Surgeon: Kary Kos, MD;  Location: [place];  Service: Neurosurgery;  Laterality: Left;  3C   LUMBAR LAMINECTOMY/DECOMPRESSION MICRODISCECTOMY N/A 02/18/2022   Procedure: Lumbar Five - Sacral One re-exploration of diskectomy;  Surgeon: Kary Kos, MD;  Location: [facility]  OR;  Service: Neurosurgery;  Laterality: N/A;   TRIGGER FINGER RELEASE Right    TUBAL LIGATION  05/02/2001    FAMILY HISTORY: Family History  Problem Relation  Age of Onset   Alcohol abuse Mother    COPD Mother    Hypertension Mother    Alcohol abuse Father    Diabetes Father    Drug abuse Father    Heart disease Father    Hypertension Father    Kidney disease Father    Rheum arthritis Sister    Ankylosing spondylitis Sister    Arthritis Maternal Grandmother    Arthritis Maternal Grandfather    Melanoma Maternal Grandfather    Cancer Paternal Grandmother    Diabetes Paternal Grandmother    Heart disease Paternal Grandmother    Diabetes Paternal Grandfather    Heart disease Paternal Grandfather    Kidney disease Paternal Grandfather    Stroke Paternal Grandfather    Hypertension Daughter    Obesity Daughter     ADVANCED DIRECTIVES (Y/N):  N  HEALTH MAINTENANCE: Social History   Tobacco Use   Smoking status: Every Day    Packs/day: 0.50    Types: Cigarettes    Last attempt to quit: 10/01/2018    Years since quitting: 3.4    Passive exposure: Never   Smokeless tobacco: Never   Tobacco comments:    5-7 ciggs per day  Vaping Use   Vaping Use: Former  Substance Use Topics   Alcohol use: Yes    Alcohol/week: 0.0 standard drinks of alcohol    Comment: rare   Drug use: Yes    Types: Marijuana    Comment: daily     Colonoscopy:  PAP:  Bone density:  Lipid panel:  Allergies  Allergen Reactions   Codeine Itching and Nausea And Vomiting    Current Outpatient Medications  Medication Sig Dispense Refill   acetaminophen (TYLENOL) 325 MG tablet Take 2 tablets (650 mg total) by mouth every 6 (six) hours as needed. 36 tablet 0   albuterol (VENTOLIN HFA) 108 (90 Base) MCG/ACT inhaler Inhale 1-2 puffs into the lungs every 6 (six) hours as needed for wheezing or shortness of breath.     sertraline (ZOLOFT) 100 MG tablet Take 1 tablet (100 mg total) by mouth daily. 90 tablet 1   ibuprofen (ADVIL) 200 MG tablet Take 800 mg by mouth every 4 (four) hours as needed for moderate pain.     Vitamin D, Ergocalciferol, (DRISDOL) 1.25 MG  (50000 UNIT) CAPS capsule Take 1 capsule (50,000 Units total) by mouth every 7 (seven) days. (Patient not taking: Reported on 02/04/2022) 5 capsule 6   No current facility-administered medications for this visit.    OBJECTIVE: There were no vitals filed for this visit.    There is no height or weight on file to calculate BMI.    ECOG FS:0 - Asymptomatic  General: Well-developed, well-nourished, no acute distress. HEENT: Normocephalic. Neuro: Alert, answering all questions appropriately. Cranial nerves grossly intact. Psych: Normal affect.  LAB RESULTS:  Lab Results  Component Value Date   NA 138 02/18/2022   K 4.1 02/18/2022   CL 96 (L) 02/18/2022   CO2 30 02/09/2022   GLUCOSE 137 (H) 02/18/2022   BUN 16 02/18/2022   CREATININE 0.60 02/18/2022   CALCIUM 9.0 02/09/2022   PROT 6.3 10/05/2021   ALBUMIN 4.2 10/05/2021   AST 9 10/05/2021   ALT 18 10/05/2021   ALKPHOS 71 10/05/2021   BILITOT 0.5 10/05/2021  GFRNONAA >60 02/09/2022   GFRAA >60 10/15/2018    Lab Results  Component Value Date   WBC 11.3 (H) 03/28/2022   NEUTROABS 7.5 03/28/2022   HGB 15.2 (H) 03/28/2022   HCT 45.1 03/28/2022   MCV 87.7 03/28/2022   PLT 528 (H) 03/28/2022   Lab Results  Component Value Date   IRON 78 03/28/2022   TIBC 500 (H) 03/28/2022   IRONPCTSAT 16 03/28/2022   Lab Results  Component Value Date   FERRITIN 20 03/28/2022     STUDIES: No results found.  ASSESSMENT: Thrombocytosis, iron deficiency anemia.  PLAN:  1.  Iron deficiency anemia: Patient's hemoglobin and iron stores continue to be within normal limits.  In fact, she has a mild polycythemia today.  She does not require additional IV Venofer today.  She last received treatment on February 09, 2021.  No intervention is needed at this time.  After lengthy discussion with the patient, no further follow-up is necessary.  Please refer patient back if there are any questions or concerns.   2.  Thrombocytosis: Chronic and  unchanged.  Upon review of the chart patient's platelet count has been chronically elevated since at least December 2008 ranging between 427 and 693.  Her most recent results were 528.  Previously, JAK-2 mutation was negative. 3.  Back pain: Patient recently underwent back surgery at the end of October. 4.  Leukocytosis: Likely reactive, monitor.  I provided 20 minutes of face-to-face video visit time during this encounter which included chart review, counseling, and coordination of care as documented above.   Patient expressed understanding that she can return to clinic at any time if she has any questions, concerns, or complaints.   Lloyd Huger, MD 03/29/2022 3:10 PM

## 2022-03-30 ENCOUNTER — Other Ambulatory Visit (HOSPITAL_COMMUNITY): Payer: Self-pay | Admitting: Neurosurgery

## 2022-03-30 DIAGNOSIS — M5126 Other intervertebral disc displacement, lumbar region: Secondary | ICD-10-CM

## 2022-03-31 ENCOUNTER — Ambulatory Visit (HOSPITAL_COMMUNITY)
Admission: RE | Admit: 2022-03-31 | Discharge: 2022-03-31 | Disposition: A | Discharge status: Home / Self Care | Payer: 59 | Source: Ambulatory Visit | Attending: Neurosurgery | Admitting: Neurosurgery

## 2022-03-31 DIAGNOSIS — M5126 Other intervertebral disc displacement, lumbar region: Secondary | ICD-10-CM | POA: Diagnosis not present

## 2022-03-31 MED ORDER — GADOBUTROL 1 MMOL/ML IV SOLN
10.0000 mL | Freq: Once | INTRAVENOUS | Status: AC | PRN
  Administered 2022-03-31: 10 mL via INTRAVENOUS

## 2022-04-06 ENCOUNTER — Other Ambulatory Visit: Payer: Self-pay | Admitting: Nurse Practitioner

## 2022-04-06 ENCOUNTER — Encounter: Payer: Self-pay | Admitting: Nurse Practitioner

## 2022-04-06 ENCOUNTER — Ambulatory Visit (INDEPENDENT_AMBULATORY_CARE_PROVIDER_SITE_OTHER): Payer: 59 | Admitting: Nurse Practitioner

## 2022-04-06 VITALS — BP 123/90 | HR 89 | Temp 98.6°F | Ht 65.0 in | Wt 238.0 lb

## 2022-04-06 DIAGNOSIS — G8929 Other chronic pain: Secondary | ICD-10-CM

## 2022-04-06 DIAGNOSIS — M5442 Lumbago with sciatica, left side: Secondary | ICD-10-CM | POA: Diagnosis not present

## 2022-04-06 DIAGNOSIS — Z1231 Encounter for screening mammogram for malignant neoplasm of breast: Secondary | ICD-10-CM

## 2022-04-06 DIAGNOSIS — N6310 Unspecified lump in the right breast, unspecified quadrant: Secondary | ICD-10-CM

## 2022-04-06 DIAGNOSIS — F32A Depression, unspecified: Secondary | ICD-10-CM

## 2022-04-06 MED ORDER — SERTRALINE HCL 100 MG PO TABS: 150.0000 mg | ORAL_TABLET | Freq: Every day | ORAL | 3 refills | Status: DC

## 2022-04-06 MED ORDER — DICLOFENAC SODIUM 75 MG PO TBEC: 75.0000 mg | DELAYED_RELEASE_TABLET | Freq: Two times a day (BID) | ORAL | 0 refills | Status: DC

## 2022-04-06 NOTE — Patient Instructions (Addendum)
1. Chronic left-sided low back pain with left-sided sciatica  - Ambulatory referral to Physical Medicine Rehab - diclofenac (VOLTAREN) 75 MG EC tablet; Take 1 tablet (75 mg total) by mouth 2 (two) times daily.  Dispense: 30 tablet; Refill: 0   2. Depression, unspecified depression type  - sertraline (ZOLOFT) 100 MG tablet; Take 1.5 tablets (150 mg total) by mouth daily.  Dispense: 30 tablet; Refill: 3   Follow up:  Follow up in 6 months

## 2022-04-06 NOTE — Progress Notes (Unsigned)
$'@Patient'u$  ID: Erin Zamora, female    DOB: 08-16-1970, 51 y.o.   MRN: [id number]  Chief Complaint  Patient presents with   Follow-up    Pt stated--chronic back pain--    Referring provider: Teena Dunk, NP   HPI  Erin Zamora 51 y.o. female  has a past medical history of Allergy, Anemia, Anxiety, CAP (community acquired pneumonia) (10/2018), Cough (10/2018), Hyperlipidemia (10/2018), Hypokalemia, Insomnia (10/2018), Microcytic anemia, Skin lesions (10/2019), Thrombocytosis, and Vitamin D deficiency (10/2018).    Patient presents today for follow-up visit.  She states that she did have recent low back surgery.  She has followed with orthopedics.  She is set up to start working with physical therapy.  Patient has been evaluated by rheumatology for fibromyalgia.  Patient states that she still has a lot of pain.  We discussed that we can refer her to pain management.  Patient is agreeable to this. Denies f/c/s, n/v/d, hemoptysis, PND, leg swelling Denies chest pain or edema       Allergies  Allergen Reactions   Codeine Itching and Nausea And Vomiting     There is no immunization history on file for this patient.  Past Medical History:  Diagnosis Date   Allergy    seasonal   Anemia    Anxiety    CAP (community acquired pneumonia) 10/2018   COPD (chronic obstructive pulmonary disease) ([place])    Cough 10/2018   Dyspnea    Fibromyalgia    Hyperlipidemia 10/2018   Hypertension    Hypokalemia    Insomnia 10/2018   Microcytic anemia    Pre-diabetes    Skin lesions 10/2019   Thrombocytosis    Vitamin D deficiency 10/2018    Tobacco History: Social History   Tobacco Use  Smoking Status Every Day   Packs/day: 0.50   Types: Cigarettes   Last attempt to quit: 10/01/2018   Years since quitting: 3.5   Passive exposure: Never  Smokeless Tobacco Never  Tobacco Comments   5-7 ciggs per day   Ready to quit: Not Answered Counseling given: Not  Answered Tobacco comments: 5-7 ciggs per day   Outpatient Encounter Medications as of 04/06/2022  Medication Sig   acetaminophen (TYLENOL) 325 MG tablet Take 2 tablets (650 mg total) by mouth every 6 (six) hours as needed.   albuterol (VENTOLIN HFA) 108 (90 Base) MCG/ACT inhaler Inhale 1-2 puffs into the lungs every 6 (six) hours as needed for wheezing or shortness of breath.   diclofenac (VOLTAREN) 75 MG EC tablet Take 1 tablet (75 mg total) by mouth 2 (two) times daily.   ibuprofen (ADVIL) 200 MG tablet Take 800 mg by mouth every 4 (four) hours as needed for moderate pain.   sertraline (ZOLOFT) 100 MG tablet Take 1.5 tablets (150 mg total) by mouth daily.   Vitamin D, Ergocalciferol, (DRISDOL) 1.25 MG (50000 UNIT) CAPS capsule Take 1 capsule (50,000 Units total) by mouth every 7 (seven) days.   [DISCONTINUED] sertraline (ZOLOFT) 100 MG tablet Take 1 tablet (100 mg total) by mouth daily.   No facility-administered encounter medications on file as of 04/06/2022.     Review of Systems  Review of Systems  Constitutional: Negative.   HENT: Negative.    Cardiovascular: Negative.   Gastrointestinal: Negative.   Musculoskeletal:  Positive for arthralgias, back pain and myalgias.  Allergic/Immunologic: Negative.   Neurological: Negative.   Psychiatric/Behavioral: Negative.         Physical Exam  BP (!) 123/90  Pulse 89   Temp 98.6 F (37 C)   Ht '5\' 5"'$  (1.651 m)   Wt 238 lb (108 kg)   SpO2 96%   BMI 39.61 kg/m   Wt Readings from Last 5 Encounters:  04/06/22 238 lb (108 kg)  02/19/22 239 lb 6.7 oz (108.6 kg)  02/09/22 239 lb 6.4 oz (108.6 kg)  01/14/22 243 lb 12.8 oz (110.6 kg)  10/05/21 247 lb 3.2 oz (112.1 kg)     Physical Exam Vitals and nursing note reviewed.  Constitutional:      General: She is not in acute distress.    Appearance: She is well-developed.  Cardiovascular:     Rate and Rhythm: Normal rate and regular rhythm.  Pulmonary:     Effort: Pulmonary  effort is normal.     Breath sounds: Normal breath sounds.  Neurological:     Mental Status: She is alert and oriented to person, place, and time.       Assessment & Plan:   Chronic left-sided low back pain with left-sided sciatica - Ambulatory referral to Physical Medicine Rehab - diclofenac (VOLTAREN) 75 MG EC tablet; Take 1 tablet (75 mg total) by mouth 2 (two) times daily.  Dispense: 30 tablet; Refill: 0   2. Depression, unspecified depression type  - sertraline (ZOLOFT) 100 MG tablet; Take 1.5 tablets (150 mg total) by mouth daily.  Dispense: 30 tablet; Refill: 3   Follow up:  Follow up in 6 months     Fenton Foy, NP 04/07/2022

## 2022-04-07 ENCOUNTER — Encounter: Payer: Self-pay | Admitting: Nurse Practitioner

## 2022-04-07 NOTE — Assessment & Plan Note (Signed)
-   Ambulatory referral to Physical Medicine Rehab - diclofenac (VOLTAREN) 75 MG EC tablet; Take 1 tablet (75 mg total) by mouth 2 (two) times daily.  Dispense: 30 tablet; Refill: 0   2. Depression, unspecified depression type  - sertraline (ZOLOFT) 100 MG tablet; Take 1.5 tablets (150 mg total) by mouth daily.  Dispense: 30 tablet; Refill: 3   Follow up:  Follow up in 6 months

## 2022-04-12 ENCOUNTER — Telehealth: Payer: Self-pay | Admitting: Clinical

## 2022-04-12 NOTE — Telephone Encounter (Signed)
Integrated Behavioral Health Case Management Referral Note  04/12/2022 Name: Erin Zamora MRN: [id number] DOB: Apr 17, 1971 [name] is a 51 y.o. year old female who sees Passmore, Jake Church I, NP for primary care. LCSW was consulted to assess patient's needs and assist the patient with Mental Health Counseling and Resources.  Interpreter: No.   Interpreter Name & Language: none  Assessment: Patient experiencing mental health concerns. She is experiencing increased depression in context of health conditions. She also lost three family members earlier this year.  Intervention: CSW called patient to follow up on referral from PCP after last visit. Patient would like mental health counseling. She was in counseling about a year and a half ago, but saw several different therapists because her therapists kept leaving the practice. She would prefer to start with a therapist that she will see long term. CSW to assist patient in identifying a therapist in network with her insurance.  Review of patient status, including review of consultants reports, relevant laboratory and other test results, and collaboration with appropriate care team members and the patient's provider was performed as part of comprehensive patient evaluation and provision of services.    Estanislado Emms, [facility] [phone]

## 2022-04-14 ENCOUNTER — Encounter: Payer: Self-pay | Admitting: Physical Medicine & Rehabilitation

## 2022-05-03 ENCOUNTER — Telehealth: Payer: Self-pay | Admitting: Clinical

## 2022-05-03 NOTE — Telephone Encounter (Signed)
Integrated Behavioral Health General Follow Up Note  05/03/2022 Name: Erin Zamora MRN: [id number] DOB: 1970-06-18 [name] is a 53 y.o. year old female who sees Passmore, Jake Church I, NP for primary care. LCSW was initially consulted to assist the patient with Mental Health Counseling and Resources.  Interpreter: No.   Interpreter Name & Language: none  Assessment: Patient experiencing mental health concerns. She is experiencing increased depression in context of health conditions. She also lost three family members earlier this year.   Ongoing Intervention: Today CSW called patient to follow up on therapy referrals. Patient indicated she received the list of therapists, though has not had a chance to call any of them yet due to the holidays and then starting back at work. She is also concerned about co-pays so she will check with her insurance on that. Advised patient that CSW available to assist in getting connected with therapist as needed. Patient stated she will call if she needs assistance.  Review of patient status, including review of consultants reports, relevant laboratory and other test results, and collaboration with appropriate care team members and the patient's provider was performed as part of comprehensive patient evaluation and provision of services.    Estanislado Emms, [facility] [phone]

## 2022-05-31 ENCOUNTER — Encounter: Payer: 59 | Attending: Physical Medicine & Rehabilitation | Admitting: Physical Medicine & Rehabilitation

## 2022-05-31 ENCOUNTER — Encounter: Payer: Self-pay | Admitting: Physical Medicine & Rehabilitation

## 2022-05-31 ENCOUNTER — Ambulatory Visit: Payer: 59

## 2022-05-31 VITALS — BP 155/98 | HR 84 | Ht 65.0 in | Wt 240.0 lb

## 2022-05-31 DIAGNOSIS — F32A Depression, unspecified: Secondary | ICD-10-CM | POA: Diagnosis present

## 2022-05-31 DIAGNOSIS — M5442 Lumbago with sciatica, left side: Secondary | ICD-10-CM

## 2022-05-31 DIAGNOSIS — M797 Fibromyalgia: Secondary | ICD-10-CM

## 2022-05-31 DIAGNOSIS — G8929 Other chronic pain: Secondary | ICD-10-CM

## 2022-05-31 MED ORDER — PREGABALIN 25 MG PO CAPS: 25.0000 mg | ORAL_CAPSULE | Freq: Three times a day (TID) | ORAL | 3 refills | Status: DC

## 2022-05-31 NOTE — Progress Notes (Signed)
Subjective:    Patient ID: Erin Zamora, female    DOB: 1971-01-30, 52 y.o.   MRN: [id number]  HPI   Erin Zamora is a 52 y.o. year old female  who  has a past medical history of Allergy, Anemia, Anxiety, CAP (community acquired pneumonia) (10/2018), COPD (chronic obstructive pulmonary disease) ([place]), Cough (10/2018), Dyspnea, Fibromyalgia, Hyperlipidemia (10/2018), Hypertension, Hypokalemia, Insomnia (10/2018), Microcytic anemia, Pre-diabetes, Skin lesions (10/2019), Thrombocytosis, and Vitamin D deficiency (10/2018).   They are presenting to PM&R clinic as a new patient for pain management evaluation.  She reports that her back and leg pain began about 5 to 6 years ago.  She has had back pain shooting down her left buttocks, hip, leg, front and back of her thigh.  She also has paresthesias on her lateral ankle and dorsal left foot.  She reports this sensation in her foot started about a year before her back surgery.  On 02/16/2022 she had a microdiscectomy on the left at L5-S1 and L4-L5 by Dr. Kary Kos.  She had the surgery for severe spinal stenosis at L4-L5 and disc herniation L5-S1.  It is noted that she was having radiating pain in the L5 and S1 nerve root distributions before this.  She worked with PT after the surgery.  She reports that the pain has not significantly improved since the surgery.  She feels that the pain shooting down her leg is currently worse in severity than the pain in her lower back.  She reports doing a lot for family at home and she is currently working.  At work she typically leans forward on a counter to help the pain.  Standing worsens the pain, sitting also causes pain.  Laying makes the pain worse.  Her ability to do activities is limited by the pain.  She reports she is very afraid of activity is making the pain worse. Additionally she has a history of fibromyalgia.  She reports she was seen by rheumatology to rule out other possible conditions however  she says rheumatology felt that fibromyalgia was the most likely diagnosis.  She says she has a sister who has rheumatoid arthritis and ankylosing spondylitis.  She has history of anxiety and depression.  Reports poor sleep.   Red flag symptoms: No red flags for back pain endorsed in Hx or ROS, saddle anesthesia, loss of bowel or bladder continence, new weakness, new numbness/tingling, and pain waking up at nighttime.  Medications tried: Tylenol doesn't help Ibuprofen- Helps a '800mg'$  dose Cortisone- helped but caused glucose issues Gabapentin- dont take it, only at night, horrible if I take it during the day, tried '100mg'$  dose Lyrica has not tried Hydrocodone- after her surgery- not using recently Tramadol- doesn't remember, appears she may have been ordered this previously Cymbalta didn't help in the past, this was years ago Marijuanna helps her pain and anxiety    Other Treatments: PT- helped function, minimal benefit the pain, she did not like dry needling ESI- helped for a week TENS- didn't help   Pain Inventory Average Pain 6 Pain Right Now 7 My pain is burning, dull, stabbing, tingling, and aching  In the last 24 hours, has pain interfered with the following? General activity 6 Relation with others 7 Enjoyment of life 8 What TIME of day is your pain at its worst? morning , daytime, and evening Sleep (in general) Poor  Pain is worse with: walking, bending, and sitting Pain improves with:  unsure Relief from Meds:  N/A  ability to climb steps?  yes do you drive?  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  no  Any changes since last visit?  no    Family History  Problem Relation Age of Onset   Alcohol abuse Mother    COPD Mother    Hypertension Mother    Alcohol abuse  Father    Diabetes Father    Drug abuse Father    Heart disease Father    Hypertension Father    Kidney disease Father    Rheum arthritis Sister    Ankylosing spondylitis Sister    Arthritis Maternal Grandmother    Arthritis Maternal Grandfather    Melanoma Maternal Grandfather    Cancer Paternal Grandmother    Diabetes Paternal Grandmother    Heart disease Paternal Grandmother    Diabetes Paternal Grandfather    Heart disease Paternal Grandfather    Kidney disease Paternal Grandfather    Stroke Paternal Grandfather    Hypertension Daughter    Obesity Daughter    Social History   Socioeconomic History   Marital status: Single    Spouse name: Not on file   Number of children: Not on file   Years of education: Not on file   Highest education level: Not on file  Occupational History   Not on file  Tobacco Use   Smoking status: Every Day    Packs/day: 0.50    Types: Cigarettes    Last attempt to quit: 10/01/2018    Years since quitting: 3.6    Passive exposure: Never   Smokeless tobacco: Never   Tobacco comments:    5-7 ciggs per day  Vaping Use   Vaping Use: Former  Substance and Sexual Activity   Alcohol use: Yes    Alcohol/week: 0.0 standard drinks of alcohol    Comment: rare   Drug use: Yes    Types: Marijuana    Comment: daily   Sexual activity: Yes    Partners: Male    Birth control/protection: None  Other Topics Concern   Not on file  Social History Narrative   51 year old and 54 y/o daughters   89 year old granddaughter - takes care of her in the afternoon after school   Significant other - 15 years   Work: Web designer   Social Determinants of Radio broadcast assistant Strain: Not on file  Food Insecurity: No [place] (02/19/2022)   Hunger Vital Sign    Worried About Running Out of Food in the Last Year: Never true    [place] in the Last Year: Never true  Transportation Needs: No Transportation Needs (02/19/2022)    PRAPARE - Hydrologist (Medical): No    Lack of Transportation (Non-Medical): No  Physical Activity: Not on file  Stress: Not on file  Social Connections: Not on file   Past Surgical History:  Procedure Laterality Date   APPENDECTOMY  05/02/1989   CHOLECYSTECTOMY  05/02/2001   COLONOSCOPY WITH PROPOFOL N/A 05/18/2021   Procedure: COLONOSCOPY WITH PROPOFOL;  Surgeon: Jonathon Bellows, MD;  [facility] ENDOSCOPY;  Service: Gastroenterology;  Laterality: N/A;   LUMBAR LAMINECTOMY/DECOMPRESSION MICRODISCECTOMY Left 02/16/2022   Procedure: Left - Lumbar Four-Lumbar Five laminectomy and foraminotomy, Left - Lumbar Five-Sacral One microdiskectomy;  Surgeon: Kary Kos,  MD;  Location: [place];  Service: Neurosurgery;  Laterality: Left;  3C   LUMBAR LAMINECTOMY/DECOMPRESSION MICRODISCECTOMY N/A 02/18/2022   Procedure: Lumbar Five - Sacral One re-exploration of diskectomy;  Surgeon: Kary Kos, MD;  Location: [place];  Service: Neurosurgery;  Laterality: N/A;   TRIGGER FINGER RELEASE Right    TUBAL LIGATION  05/02/2001   Past Medical History:  Diagnosis Date   Allergy    seasonal   Anemia    Anxiety    CAP (community acquired pneumonia) 10/2018   COPD (chronic obstructive pulmonary disease) ([place])    Cough 10/2018   Dyspnea    Fibromyalgia    Hyperlipidemia 10/2018   Hypertension    Hypokalemia    Insomnia 10/2018   Microcytic anemia    Pre-diabetes    Skin lesions 10/2019   Thrombocytosis    Vitamin D deficiency 10/2018   Ht '5\' 5"'$  (1.651 m)   Wt 240 lb (108.9 kg)   BMI 39.94 kg/m   Opioid Risk Score:   Fall Risk Score:  `1  Depression screen [facility] 2/9     10/05/2021    9:16 AM 04/06/2021    9:01 AM 01/05/2021    2:21 PM 07/24/2020    3:03 PM 01/24/2020    2:49 PM 01/01/2019    8:58 AM 11/20/2018    2:56 PM  Depression screen PHQ 2/9  Decreased Interest 0 0 0 0 0 0 0  Down, Depressed, Hopeless 3 1 0 0 0 0 0  PHQ - 2 Score 3 1 0 0 0 0 0  Altered sleeping  3 2       Tired, decreased energy 3 2       Change in appetite 0        Feeling bad or failure about yourself  0 0       Trouble concentrating 1 1       Moving slowly or fidgety/restless 0 0       Suicidal thoughts 0 0       PHQ-9 Score 10 6           Review of Systems  Musculoskeletal:  Positive for back pain.       Left leg pain  All other systems reviewed and are negative.      Objective:   Physical Exam Gen: no distress, normal appearing HEENT: oral mucosa pink and moist, NCAT Chest: normal effort, normal rate of breathing Abd: soft, non-distended Ext: no edema Psych: pleasant, normal affect Skin: intact Neuro: Alert and oriented, follows commands, cranial nerves II through XII grossly intact, normal speech and language, no ataxia or dysmetria Strength 5 out of 5 in bilateral upper and lower extremities Sensation intact light touch in all 4 extremities Hoffmann's negative no ankle clonus DTR 1 at bilateral ankle, 2 right knee, 1 left knee Musculoskeletal:   Paraspinal muscle tenderness in the L-spine left greater than right Altered sensation in the L5 left lower extremity distribution SLR positive on Left, negative on the right FABER positive on the left SI compression positive on the left Hip movement in all directions resulted in worsening of her L5 distribution pain  Midly Lmited S spine movement in all directions She does have tenderness to palpation to a lesser degree throughout her upper and lower extremities.  Pain with palpation of her bilateral shoulders, elbows, wrist, knees, ankles, hips  Lspine MRI 03/31/22 IMPRESSION: 1. Interval evacuation of posterior epidural hematoma at L4-5 and L5-S1 with improved patency  of the thecal sac at both levels. Mild residual right lateral recess and biforaminal narrowing at L4-5 without definite nerve root encroachment. 2. Stable disc protrusions at T12-L1 and L1-2 without apparent nerve root encroachment. Chronic  extraforaminal disc protrusions on the left at L2-3 and L3-4 with possible resulting chronic left-sided nerve root encroachment are unchanged. 3. No acute findings.      Assessment & Plan:   Prior UDS results: No results found for: "LABOPIA", "COCAINSCRNUR", "LABBENZ", "AMPHETMU", "THCU", "LABBARB"  Assessment and Plan: Erin Zamora is a 52 y.o. year old female  who  has a past medical history of Allergy, Anemia, Anxiety, CAP (community acquired pneumonia) (10/2018), COPD (chronic obstructive pulmonary disease) ([place]), Cough (10/2018), Dyspnea, Fibromyalgia, Hyperlipidemia (10/2018), Hypertension, Hypokalemia, Insomnia (10/2018), Microcytic anemia, Pre-diabetes, Skin lesions (10/2019), Thrombocytosis, and Vitamin D deficiency (10/2018).   They are presenting to PM&R clinic as a new patient for treatment of back pain.  Chronic lower back pain left greater than right with L5 distribution sciatica.  -02/16/2022 she had a microdiscectomy on the left at L5-S1 and L4-L5 by Dr. Kary Kos -ORT moderate  -She would need to discontinue use of marijuana if opioid medications were to be considered. -Possible left SI joint pain contributing to lumbar back pain, injection could be considered at a later time -Will DC gabapentin and start Lyrica 25 mg 3 times daily, will start a low-dose as she is sensitive to medications  Fibromyalgia -Continue sertraline, retrying SNRI could be an option at a later time -Consider pool therpay  Depression -Continue sertraline as above

## 2022-06-01 ENCOUNTER — Other Ambulatory Visit: Payer: Self-pay

## 2022-06-01 ENCOUNTER — Emergency Department (HOSPITAL_COMMUNITY): Payer: 59

## 2022-06-01 ENCOUNTER — Encounter (HOSPITAL_COMMUNITY): Payer: Self-pay | Admitting: Emergency Medicine

## 2022-06-01 ENCOUNTER — Emergency Department (HOSPITAL_COMMUNITY)
Admission: EM | Admit: 2022-06-01 | Discharge: 2022-06-01 | Disposition: A | Discharge status: Home / Self Care | Payer: 59 | Attending: Emergency Medicine | Admitting: Emergency Medicine

## 2022-06-01 DIAGNOSIS — Z7951 Long term (current) use of inhaled steroids: Secondary | ICD-10-CM | POA: Diagnosis not present

## 2022-06-01 DIAGNOSIS — R42 Dizziness and giddiness: Secondary | ICD-10-CM | POA: Diagnosis not present

## 2022-06-01 DIAGNOSIS — R0600 Dyspnea, unspecified: Secondary | ICD-10-CM | POA: Insufficient documentation

## 2022-06-01 DIAGNOSIS — R079 Chest pain, unspecified: Secondary | ICD-10-CM

## 2022-06-01 DIAGNOSIS — J449 Chronic obstructive pulmonary disease, unspecified: Secondary | ICD-10-CM | POA: Diagnosis not present

## 2022-06-01 DIAGNOSIS — R0789 Other chest pain: Secondary | ICD-10-CM | POA: Diagnosis not present

## 2022-06-01 DIAGNOSIS — F17200 Nicotine dependence, unspecified, uncomplicated: Secondary | ICD-10-CM | POA: Insufficient documentation

## 2022-06-01 DIAGNOSIS — I10 Essential (primary) hypertension: Secondary | ICD-10-CM | POA: Diagnosis not present

## 2022-06-01 LAB — BASIC METABOLIC PANEL
Anion gap: 8 (ref 5–15)
BUN: 5 mg/dL — ABNORMAL LOW (ref 6–20)
CO2: 32 mmol/L (ref 22–32)
Calcium: 9.4 mg/dL (ref 8.9–10.3)
Chloride: 98 mmol/L (ref 98–111)
Creatinine, Ser: 0.76 mg/dL (ref 0.44–1.00)
GFR, Estimated: >60 mL/min (ref >60)
Glucose, Bld: 115 mg/dL — ABNORMAL HIGH (ref 70–99)
Potassium: 4 mmol/L (ref 3.5–5.1)
Sodium: 138 mmol/L (ref 135–145)

## 2022-06-01 LAB — CBC
HCT: 45.7 % (ref 36.0–46.0)
Hemoglobin: 14.7 g/dL (ref 12.0–15.0)
MCH: 28.7 pg (ref 26.0–34.0)
MCHC: 32.2 g/dL (ref 30.0–36.0)
MCV: 89.1 fL (ref 80.0–100.0)
Platelets: 554 10*3/uL — ABNORMAL HIGH (ref 150–400)
RBC: 5.13 MIL/uL — ABNORMAL HIGH (ref 3.87–5.11)
RDW: 13.6 % (ref 11.5–15.5)
WBC: 11.8 10*3/uL — ABNORMAL HIGH (ref 4.0–10.5)
nRBC: 0 % (ref 0.0–0.2)

## 2022-06-01 LAB — TROPONIN I (HIGH SENSITIVITY)
Troponin I (High Sensitivity): 7 ng/L (ref <18)
Troponin I (High Sensitivity): 9 ng/L (ref <18)

## 2022-06-01 NOTE — ED Provider Triage Note (Signed)
Emergency Medicine Provider Triage Evaluation Note  Erin Zamora , a 52 y.o. female  was evaluated in triage.  Pt complains of chest pain.  Chest pain is across the entire chest and the left lower ribs.  Symptoms have been intermittent since this morning.  There is nothing that makes the pain better or worse.  Has no cardiac history.  Also has shortness of breath with exertion.  Also had a near syncopal episode 2 weeks ago.  Review of Systems  Positive: As above Negative: As above  Physical Exam  BP (!) 153/97   Pulse 79   Temp 98.3 F (36.8 C) (Oral)   Resp 20   Ht '5\' 5"'$  (1.651 m)   Wt 109.8 kg   LMP 10/30/2021   SpO2 97%   BMI 40.27 kg/m  Gen:   Awake, no distress   Resp:  Normal effort  MSK:   Moves extremities without difficulty  Other:    Medical Decision Making  Medically screening exam initiated at 2:21 PM.  Appropriate orders placed.  [name] was informed that the remainder of the evaluation will be completed by another provider, this initial triage assessment does not replace that evaluation, and the importance of remaining in the ED until their evaluation is complete.  Chest pain workup initiated   Roylene Reason, Hershal Coria 06/01/22 1422

## 2022-06-01 NOTE — ED Triage Notes (Signed)
Mid sternal chest pain on and off for several weeks worse this morning. SOB with exertion last night had elevated bp and heart rate at home. Pain radiates from midsternal under left breast and into back. Bayer taken this morning.

## 2022-06-01 NOTE — ED Provider Notes (Signed)
[facility] Provider Note   CSN: [id number] Arrival date & time: 06/01/22  1332     History  Chief Complaint  Patient presents with   Chest Pain    Erin Zamora is a 52 y.o. female.  Patient presents the emergency department complaining of substernal and left-sided chest pain with some radiation across the abdomen.  Pain is described as a tightness.  Patient states the pain initially began 2 weeks ago.  It is worse with activity.  Patient also endorses some mild dyspnea that accompanies the chest discomfort along with a feeling of lightheadedness.  She denies headache, urinary symptoms, blood in stool.  She does endorse current daily smoking.  She reports her father who had a heart attack in his mid 40s.  Past medical history significant for COPD, prediabetes, hypertension, fibromyalgia, anxiety  HPI     Home Medications Prior to Admission medications   Medication Sig Start Date End Date Taking? Authorizing Provider  acetaminophen (TYLENOL) 325 MG tablet Take 2 tablets (650 mg total) by mouth every 6 (six) hours as needed. Patient not taking: Reported on 05/31/2022 01/04/21   Wynona Dove A, DO  albuterol (VENTOLIN HFA) 108 (90 Base) MCG/ACT inhaler Inhale 1-2 puffs into the lungs every 6 (six) hours as needed for wheezing or shortness of breath. Patient not taking: Reported on 05/31/2022    [provider]  diclofenac (VOLTAREN) 75 MG EC tablet Take 1 tablet (75 mg total) by mouth 2 (two) times daily. Patient not taking: Reported on 05/31/2022 04/06/22   Fenton Foy, NP  ibuprofen (ADVIL) 200 MG tablet Take 800 mg by mouth every 4 (four) hours as needed for moderate pain.    [provider]  pregabalin (LYRICA) 25 MG capsule Take 1 capsule (25 mg total) by mouth 3 (three) times daily. 05/31/22   Jennye Boroughs, MD  sertraline (ZOLOFT) 100 MG tablet Take 1.5 tablets (150 mg total) by mouth daily. 04/06/22    Fenton Foy, NP  Vitamin D, Ergocalciferol, (DRISDOL) 1.25 MG (50000 UNIT) CAPS capsule Take 1 capsule (50,000 Units total) by mouth every 7 (seven) days. Patient not taking: Reported on 05/31/2022 10/05/21   Bo Merino I, NP      Allergies    Codeine    Review of Systems   Review of Systems  Respiratory:  Positive for shortness of breath.   Cardiovascular:  Positive for chest pain.  Gastrointestinal:  Positive for abdominal pain. Negative for nausea and vomiting.  Genitourinary:  Negative for dysuria.  Neurological:  Positive for light-headedness.    Physical Exam Updated Vital Signs BP (!) 160/88   Pulse 65   Temp 97.9 F (36.6 C)   Resp (!) 22   Ht '5\' 5"'$  (1.651 m)   Wt 109.8 kg   LMP 10/30/2021   SpO2 100%   BMI 40.27 kg/m  Physical Exam Vitals and nursing note reviewed.  Constitutional:      General: She is not in acute distress.    Appearance: She is well-developed. She is obese.  HENT:     Head: Normocephalic and atraumatic.  Eyes:     Conjunctiva/sclera: Conjunctivae normal.  Cardiovascular:     Rate and Rhythm: Normal rate and regular rhythm.     Heart sounds: No murmur heard. Pulmonary:     Effort: Pulmonary effort is normal. No respiratory distress.     Breath sounds: Normal breath sounds.  Chest:  Chest wall: No tenderness.  Abdominal:     Palpations: Abdomen is soft.     Tenderness: There is no abdominal tenderness.  Musculoskeletal:        General: No swelling.     Cervical back: Neck supple.     Right lower leg: No edema.     Left lower leg: No edema.  Skin:    General: Skin is warm and dry.     Capillary Refill: Capillary refill takes less than 2 seconds.  Neurological:     Mental Status: She is alert.  Psychiatric:        Mood and Affect: Mood normal.     ED Results / Procedures / Treatments   Labs (all labs ordered are listed, but only abnormal results are displayed) Labs Reviewed  BASIC METABOLIC PANEL - Abnormal;  Notable for the following components:      Result Value   Glucose, Bld 115 (*)    BUN 5 (*)    All other components within normal limits  CBC - Abnormal; Notable for the following components:   WBC 11.8 (*)    RBC 5.13 (*)    Platelets 554 (*)    All other components within normal limits  TROPONIN I (HIGH SENSITIVITY)  TROPONIN I (HIGH SENSITIVITY)    EKG EKG Interpretation  Date/Time:  Wednesday June 01 2022 13:50:09 EST Ventricular Rate:  76 PR Interval:  162 QRS Duration: 72 QT Interval:  384 QTC Calculation: 432 R Axis:   24 Text Interpretation: Normal sinus rhythm Normal ECG When compared with ECG of 09-Feb-2022 15:05, PREVIOUS ECG IS PRESENT Confirmed by Wandra Arthurs [phone]) on 06/01/2022 5:26:17 PM  Radiology DG Chest 2 View  Result Date: 06/01/2022 CLINICAL DATA:  Chest pain EXAM: CHEST - 2 VIEW COMPARISON:  Chest x-ray 07/16/2020 FINDINGS: The heart size and mediastinal contours are within normal limits. Both lungs are clear. The visualized skeletal structures are unremarkable. IMPRESSION: No active cardiopulmonary disease. Electronically Signed   By: Ronney Asters M.D.   On: 06/01/2022 15:03    Procedures Procedures    Medications Ordered in ED Medications - No data to display  ED Course/ Medical Decision Making/ A&P             HEART Score: 3                Medical Decision Making  This patient presents to the ED for concern of chest pain, this involves an extensive number of treatment options, and is a complaint that carries with it a high risk of complications and morbidity.  The differential diagnosis includes ACS, PE, musculoskeletal pain, pneumonia, GERD, dissection, others   Co morbidities that complicate the patient evaluation  HTN, prediabetes, smoking, obesity, anxiety, fibromyalgia   Additional history obtained:   External records from outside source obtained and reviewed including notes from physical medicine from yesterday where patient  was evaluated for chronic pain   Lab Tests:  I Ordered, and personally interpreted labs.  The pertinent results include:  WBC 11.8, unremarkable BMP, initial troponin 7, repeat troponin 9   Imaging Studies ordered:  I ordered imaging studies including chest x-ray I independently visualized and interpreted imaging which showed no active disease I agree with the radiologist interpretation   Cardiac Monitoring: / EKG:  The patient was maintained on a cardiac monitor.  I personally viewed and interpreted the cardiac monitored which showed an underlying rhythm of: Sinus rhythm   Test / Admission -  Considered:  Patient with negative troponins, nonischemic EKG.  Very low clinical suspicion of ACS at this time.  No tachycardia or sharp pain to suggest PE at this time.  No clinical signs of dissection.  No pneumonia on chest x-ray.  Pain is not easily reproducible.  Unclear etiology of pain at this time.  Vitals are normal.  Plan to discharge patient home with plans for follow-up with cardiology as an outpatient.  Patient given return precautions including worsening chest pain, worsening shortness of breath        Final Clinical Impression(s) / ED Diagnoses Final diagnoses:  Chest pain, unspecified type  Lightheadedness    Rx / DC Orders ED Discharge Orders          Ordered    Ambulatory referral to Cardiology       Comments: If you have not heard from the Cardiology office within the next 72 hours please call [phone].   06/01/22 1911              Ronny Bacon 06/01/22 1912    Drenda Freeze, MD 06/01/22 2216

## 2022-06-01 NOTE — Discharge Instructions (Signed)
You were evaluated today for nonspecific chest pain.  Your workup was reassuring for no signs of acute coronary syndrome at this time.  I have placed a referral to cardiology for outpatient evaluation of your chest pain.  If your chest pain worsens or you develop worsening shortness of breath please return to the emergency department.

## 2022-06-07 ENCOUNTER — Telehealth: Payer: Self-pay

## 2022-06-07 NOTE — Telephone Encounter (Signed)
Transition Care Management Unsuccessful Follow-up Telephone Call  Date of discharge and from where:  06/01/22  Attempts:  1st Attempt  Reason for unsuccessful TCM follow-up call:  No answer/busy pt was at work .   [name]

## 2022-06-29 ENCOUNTER — Encounter: Payer: Self-pay | Admitting: Interventional Cardiology

## 2022-06-29 ENCOUNTER — Ambulatory Visit: Payer: 59 | Attending: Interventional Cardiology | Admitting: Interventional Cardiology

## 2022-06-29 VITALS — BP 142/90 | HR 80 | Ht 65.0 in | Wt 245.8 lb

## 2022-06-29 DIAGNOSIS — R072 Precordial pain: Secondary | ICD-10-CM | POA: Diagnosis not present

## 2022-06-29 DIAGNOSIS — Z8249 Family history of ischemic heart disease and other diseases of the circulatory system: Secondary | ICD-10-CM

## 2022-06-29 DIAGNOSIS — R7303 Prediabetes: Secondary | ICD-10-CM

## 2022-06-29 DIAGNOSIS — I708 Atherosclerosis of other arteries: Secondary | ICD-10-CM

## 2022-06-29 DIAGNOSIS — Z720 Tobacco use: Secondary | ICD-10-CM | POA: Diagnosis not present

## 2022-06-29 MED ORDER — METOPROLOL TARTRATE 100 MG PO TABS: ORAL_TABLET | ORAL | 0 refills | Status: DC

## 2022-06-29 NOTE — Progress Notes (Signed)
Cardiology Office Note   Date:  06/29/2022   ID:  Erin Zamora, DOB Jan 29, 1971, MRN [id number]  PCP:  Fenton Foy, NP    No chief complaint on file.  Chest pain  Wt Readings from Last 3 Encounters:  06/29/22 245 lb 12.8 oz (111.5 kg)  06/01/22 242 lb (109.8 kg)  05/31/22 240 lb (108.9 kg)       History of Present Illness: Erin Zamora is a 52 y.o. female who is being seen today for the evaluation of chest pain at the request of Ronny Bacon.   ER records from June 01, 2022 show: "Patient presents the emergency department complaining of substernal and left-sided chest pain with some radiation across the abdomen. Pain is described as a tightness. Patient states the pain initially began 2 weeks ago. It is worse with activity. Patient also endorses some mild dyspnea that accompanies the chest discomfort along with a feeling of lightheadedness. She denies headache, urinary symptoms, blood in stool. She does endorse current daily smoking. She reports her father who had a heart attack in his mid 31s. Past medical history significant for COPD, prediabetes, hypertension, fibromyalgia, anxiety "  "Patient with negative troponins, nonischemic EKG. Very low clinical suspicion of ACS at this time. No tachycardia or sharp pain to suggest PE at this time. No clinical signs of dissection. No pneumonia on chest x-ray. Pain is not easily reproducible. Unclear etiology of pain at this time. Vitals are normal. Plan to discharge patient home with plans for follow-up with cardiology as an outpatient. 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    Activity has decreased after back surgery in 10/23.  Activity has been limited since then.   Denies :  Dizziness. Leg edema. Nitroglycerin use. Orthopnea. Palpitations. Paroxysmal nocturnal dyspnea. Syncope.     Past Medical History:  Diagnosis Date   Allergy    seasonal   Anemia    Anxiety    Asthma    CAP  (community acquired pneumonia) 10/2018   Chest pain    Clotting disorder (HCC)    COPD (chronic obstructive pulmonary disease) ([place])    Cough 10/2018   Dizziness    Dyspnea    Fibromyalgia    Hyperlipidemia 10/2018   Hypertension    Hypokalemia    Insomnia 10/2018   Microcytic anemia    Pre-diabetes    Skin lesions 10/2019   Thrombocytosis    Vitamin D deficiency 10/2018    Past Surgical History:  Procedure Laterality Date   APPENDECTOMY  05/02/1989   CHOLECYSTECTOMY  05/02/2001   COLONOSCOPY WITH PROPOFOL N/A 05/18/2021   Procedure: COLONOSCOPY WITH PROPOFOL;  Surgeon: Jonathon Bellows, MD;  [facility] ENDOSCOPY;  Service: Gastroenterology;  Laterality: N/A;   LUMBAR LAMINECTOMY/DECOMPRESSION MICRODISCECTOMY Left 02/16/2022   Procedure: Left - Lumbar Four-Lumbar Five laminectomy and foraminotomy, Left - Lumbar Five-Sacral One microdiskectomy;  Surgeon: Kary Kos, MD;  Location: [place];  Service: Neurosurgery;  Laterality: Left;  3C   LUMBAR LAMINECTOMY/DECOMPRESSION MICRODISCECTOMY N/A 02/18/2022   Procedure: Lumbar Five - Sacral One re-exploration of diskectomy;  Surgeon: Kary Kos, MD;  Location: [place];  Service: Neurosurgery;  Laterality: N/A;   TRIGGER FINGER RELEASE Right    TUBAL LIGATION  05/02/2001     Current Outpatient Medications  Medication Sig Dispense Refill   acetaminophen (TYLENOL) 325 MG tablet Take 2 tablets (650 mg total) by mouth every 6 (six) hours as needed. 36 tablet 0  albuterol (VENTOLIN HFA) 108 (90 Base) MCG/ACT inhaler Inhale 1-2 puffs into the lungs every 6 (six) hours as needed for wheezing or shortness of breath.     ibuprofen (ADVIL) 200 MG tablet Take 800 mg by mouth every 4 (four) hours as needed for moderate pain.     pregabalin (LYRICA) 25 MG capsule Take 1 capsule (25 mg total) by mouth 3 (three) times daily. 90 capsule 3   sertraline (ZOLOFT) 100 MG tablet Take 1.5 tablets (150 mg total) by mouth daily. 30 tablet 3   Vitamin D,  Ergocalciferol, (DRISDOL) 1.25 MG (50000 UNIT) CAPS capsule Take 1 capsule (50,000 Units total) by mouth every 7 (seven) days. 5 capsule 6   No current facility-administered medications for this visit.    Allergies:   Codeine    Social History:  The patient  reports that she has been smoking cigarettes. She has a 12.50 pack-year smoking history. She has never been exposed to tobacco smoke. She has never used smokeless tobacco. She reports current alcohol use. She reports current drug use. Drug: Marijuana.   Family History:  The patient's family history includes Alcohol abuse in her father and mother; Ankylosing spondylitis in her sister; Arthritis in her maternal grandfather and maternal grandmother; COPD in her mother; Cancer in her paternal grandmother; Congestive Heart Failure in her father; Coronary artery disease in her maternal grandmother; Diabetes in her father, paternal grandfather, and paternal grandmother; Drug abuse in her father; Heart attack in her father and paternal grandmother; Heart disease in her father, paternal grandfather, and paternal grandmother; Hypertension in her daughter, father, and mother; Kidney disease in her father and paternal grandfather; Melanoma in her maternal grandfather; Obesity in her daughter; Rheum arthritis in her sister; Stroke in her father, mother, and paternal grandfather.    ROS:  Please see the history of present illness.   Otherwise, review of systems are positive for DOE.   All other systems are reviewed and negative.    PHYSICAL EXAM: VS:  BP (!) 142/90   Pulse 80   Ht '5\' 5"'$  (1.651 m)   Wt 245 lb 12.8 oz (111.5 kg)   LMP 10/30/2021   SpO2 93%   BMI 40.90 kg/m  , BMI Body mass index is 40.9 kg/m. GEN: Well nourished, well developed, in no acute distress HEENT: normal Neck: no JVD, carotid bruits, or masses Cardiac: RRR; no murmurs, rubs, or gallops,no edema  Respiratory:  clear to auscultation bilaterally, normal work of breathing GI:  soft, nontender, nondistended, + BS MS: no deformity or atrophy Skin: warm and dry, no rash Neuro:  Strength and sensation are intact Psych: euthymic mood, full affect   EKG:   The ekg ordered Jan 2024 demonstrates NSR, no ST changes   Recent Labs: 10/05/2021: ALT 18 06/01/2022: BUN 5; Creatinine, Ser 0.76; Hemoglobin 14.7; Platelets 554; Potassium 4.0; Sodium 138   Lipid Panel    Component Value Date/Time   CHOL 177 10/05/2021 0957   TRIG 71 10/05/2021 0957   HDL 57 10/05/2021 0957   CHOLHDL 3.1 10/05/2021 0957   LDLCALC 107 (H) 10/05/2021 0957     Other studies Reviewed: Additional studies/ records that were reviewed today with results demonstrating: ER records reviewed.   ASSESSMENT AND PLAN:  Chest pain: Several risk factors for CAD. Recurrent chest pain that comes and goes.  Significant DOE.  Plan for coronary CTA.  Metoprolol 100 mg x 1 prior to the CT.  Activity has decreased after back surgery in 10/23.  Check echo.   PreDM: Whole food, plant-based diet.  High-fiber diet.  Avoid processed foods.  INcrease exercise that does not affect her back and leg pain.  Tobacco abuse: She needs to stop smoking. Family history of early coronary artery disease: Father with MI in his 46s. LDL 107.  Iliac atherosclerosis.  Will need statin.  Dose to be adjusted based on the calcium score.     Current medicines are reviewed at length with the patient today.  The patient concerns regarding her medicines were addressed.  The following changes have been made:  No change  Labs/ tests ordered today include: CTA coronaries No orders of the defined types were placed in this encounter.   Recommend 150 minutes/week of aerobic exercise Low fat, low carb, high fiber diet recommended  Disposition:   FU in 1 year   Signed, Larae Grooms, MD  06/29/2022 3:11 PM    [facility] [place], [place], [place]  [address] Phone: [phone]; Fax: [phone]

## 2022-06-29 NOTE — Patient Instructions (Addendum)
Medication Instructions:  Your physician recommends that you continue on your current medications as directed. Please refer to the Current Medication list given to you today.  *If you need a refill on your cardiac medications before your next appointment, please call your pharmacy*   Lab Work: Lab work to be done today--BMP If you have labs (blood work) drawn today and your tests are completely normal, you will receive your results only by: [place] (if you have MyChart) OR A paper copy in the mail If you have any lab test that is abnormal or we need to change your treatment, we will call you to review the results.   Testing/Procedures: Your physician has requested that you have an echocardiogram. Echocardiography is a painless test that uses sound waves to create images of your heart. It provides your doctor with information about the size and shape of your heart and how well your heart's chambers and valves are working. This procedure takes approximately one hour. There are no restrictions for this procedure. Please do NOT wear cologne, perfume, aftershave, or lotions (deodorant is allowed). Please arrive 15 minutes prior to your appointment time.  Your physician has requested that you have cardiac CT. Cardiac computed tomography (CT) is a painless test that uses an x-ray machine to take clear, detailed pictures of your heart. For further information please visit [email or web address]. Please follow instruction sheet as given.     Follow-Up: At [facility], you and your health needs are our priority.  As part of our continuing mission to provide you with exceptional heart care, we have created designated Provider Care Teams.  These Care Teams include your primary Cardiologist (physician) and Advanced Practice Providers (APPs -  Physician Assistants and Nurse Practitioners) who all work together to provide you with the care you need, when you need it.  We recommend signing  up for the patient portal called "MyChart".  Sign up information is provided on this After Visit Summary.  MyChart is used to connect with patients for Virtual Visits (Telemedicine).  Patients are able to view lab/test results, encounter notes, upcoming appointments, etc.  Non-urgent messages can be sent to your provider as well.   To learn more about what you can do with MyChart, go to [email or web address].    Your next appointment:   12 month(s)  Provider:   Larae Grooms, MD     Other Instructions   Your cardiac CT will be scheduled at one of the below locations:   [facility] [address] [place], [place] [address] [phone]  [place] [address] [place], [place] [address] [phone]  [facility] [place], [place] [address] [phone]  If scheduled at [facility], please arrive at the [facility] and Children's Entrance (Entrance C2) of [facility] 30 minutes prior to test start time. You can use the FREE valet parking offered at entrance C (encouraged to control the heart rate for the test)  Proceed to the [facility] Radiology Department (first floor) to check-in and test prep.  All radiology patients and guests should use entrance C2 at [facility], accessed from [facility], even though the hospital's physical address listed is [address].    If scheduled at [facility] or [facility], please arrive 15 mins early for check-in and test prep.   Please follow these instructions  carefully (unless otherwise directed):  Hold all erectile dysfunction medications at least 3 days (72 hrs) prior to test. (Ie viagra, cialis, sildenafil, tadalafil, etc) We will administer nitroglycerin during this exam.   On the Night Before the Test: Be sure to Drink  plenty of water. Do not consume any caffeinated/decaffeinated beverages or chocolate 12 hours prior to your test. Do not take any antihistamines 12 hours prior to your test.  On the Day of the Test: Drink plenty of water until 1 hour prior to the test. Do not eat any food 1 hour prior to test. You may take your regular medications prior to the test.  Take metoprolol (Lopressor) two hours prior to test. If you take Furosemide/Hydrochlorothiazide/Spironolactone, please HOLD on the morning of the test. FEMALES- please wear underwire-free bra if available, avoid dresses & tight clothing           After the Test: Drink plenty of water. After receiving IV contrast, you may experience a mild flushed feeling. This is normal. On occasion, you may experience a mild rash up to 24 hours after the test. This is not dangerous. If this occurs, you can take Benadryl 25 mg and increase your fluid intake. If you experience trouble breathing, this can be serious. If it is severe call 911 IMMEDIATELY. If it is mild, please call our office. If you take any of these medications: Glipizide/Metformin, Avandament, Glucavance, please do not take 48 hours after completing test unless otherwise instructed.  We will call to schedule your test 2-4 weeks out understanding that some insurance companies will need an authorization prior to the service being performed.   For non-scheduling related questions, please contact the cardiac imaging nurse navigator should you have any questions/concerns: Marchia Bond, Cardiac Imaging Nurse Navigator Gordy Clement, Cardiac Imaging Nurse Navigator [facility] Direct Office Dial: [phone]     For scheduling needs, including cancellations and rescheduling, please call [place], [phone].  High-Fiber Eating Plan Fiber, also called dietary fiber, is a type of carbohydrate. It is found foods such as fruits, vegetables, whole grains, and beans.  A high-fiber diet can have many health benefits. Your health care provider may recommend a high-fiber diet to help: Prevent constipation. Fiber can make your bowel movements more regular. Lower your cholesterol. Relieve the following conditions: Inflammation of veins in the anus (hemorrhoids). Inflammation of specific areas of the digestive tract (uncomplicated diverticulosis). A problem of the large intestine, also called the colon, that sometimes causes pain and diarrhea (irritable bowel syndrome, or IBS). Prevent overeating as part of a weight-loss plan. Prevent heart disease, type 2 diabetes, and certain cancers. What are tips for following this plan? Reading food labels  Check the nutrition facts label on food products for the amount of dietary fiber. Choose foods that have 5 grams of fiber or more per serving. The goals for recommended daily fiber intake include: Men (age 11 or younger): 34-38 g. Men (over age 68): 28-34 g. Women (age 60 or younger): 25-28 g. Women (over age 69): 22-25 g. Your daily fiber goal is _____________ g. Shopping Choose whole fruits and vegetables instead of processed forms, such as apple juice or applesauce. Choose a wide variety of high-fiber foods such as avocados, lentils, oats, and kidney beans. Read the nutrition facts label of the foods you choose. Be aware of foods with added fiber. These foods often have high sugar and sodium amounts per serving. Cooking Use whole-grain flour for baking and cooking. Cook with  brown rice instead of white rice. Meal planning Start the day with a breakfast that is high in fiber, such as a cereal that contains 5 g of fiber or more per serving. Eat breads and cereals that are made with whole-grain flour instead of refined flour or white flour. Eat brown rice, bulgur wheat, or millet instead of white rice. Use beans in place of meat in soups, salads, and pasta dishes. Be sure that half of the grains you eat each day  are whole grains. General information You can get the recommended daily intake of dietary fiber by: Eating a variety of fruits, vegetables, grains, nuts, and beans. Taking a fiber supplement if you are not able to take in enough fiber in your diet. It is better to get fiber through food than from a supplement. Gradually increase how much fiber you consume. If you increase your intake of dietary fiber too quickly, you may have bloating, cramping, or gas. Drink plenty of water to help you digest fiber. Choose high-fiber snacks, such as berries, raw vegetables, nuts, and popcorn. What foods should I eat? Fruits Berries. Pears. Apples. Oranges. Avocado. Prunes and raisins. Dried figs. Vegetables Sweet potatoes. Spinach. Kale. Artichokes. Cabbage. Broccoli. Cauliflower. Green peas. Carrots. Squash. Grains Whole-grain breads. Multigrain cereal. Oats and oatmeal. Brown rice. Barley. Bulgur wheat. [place]. Quinoa. Bran muffins. Popcorn. Rye wafer crackers. Meats and other proteins Navy beans, kidney beans, and pinto beans. Soybeans. Split peas. Lentils. Nuts and seeds. Dairy Fiber-fortified yogurt. Beverages Fiber-fortified soy milk. Fiber-fortified orange juice. Other foods Fiber bars. The items listed above may not be a complete list of recommended foods and beverages. Contact a dietitian for more information. What foods should I avoid? Fruits Fruit juice. Cooked, strained fruit. Vegetables Fried potatoes. Canned vegetables. Well-cooked vegetables. Grains White bread. Pasta made with refined flour. White rice. Meats and other proteins Fatty cuts of meat. Fried chicken or fried fish. Dairy Milk. Yogurt. Cream cheese. Sour cream. Fats and oils Butters. Beverages Soft drinks. Other foods Cakes and pastries. The items listed above may not be a complete list of foods and beverages to avoid. Talk with your dietitian about what choices are best for you. Summary Fiber is a type of  carbohydrate. It is found in foods such as fruits, vegetables, whole grains, and beans. A high-fiber diet has many benefits. It can help to prevent constipation, lower blood cholesterol, aid weight loss, and reduce your risk of heart disease, diabetes, and certain cancers. Increase your intake of fiber gradually. Increasing fiber too quickly may cause cramping, bloating, and gas. Drink plenty of water while you increase the amount of fiber you consume. The best sources of fiber include whole fruits and vegetables, whole grains, nuts, seeds, and beans. This information is not intended to replace advice given to you by your health care provider. Make sure you discuss any questions you have with your health care provider. Document Revised: 08/22/2019 Document Reviewed: 08/22/2019 Elsevier Patient Education  [employer].

## 2022-06-30 LAB — BASIC METABOLIC PANEL
BUN/Creatinine Ratio: 9 (ref 9–23)
BUN: 6 mg/dL (ref 6–24)
CO2: 29 mmol/L (ref 20–29)
Calcium: 9.6 mg/dL (ref 8.7–10.2)
Chloride: 97 mmol/L (ref 96–106)
Creatinine, Ser: 0.67 mg/dL (ref 0.57–1.00)
Glucose: 115 mg/dL — ABNORMAL HIGH (ref 70–99)
Potassium: 4.1 mmol/L (ref 3.5–5.2)
Sodium: 139 mmol/L (ref 134–144)
eGFR: 106 mL/min/{1.73_m2} (ref >59)

## 2022-07-13 ENCOUNTER — Telehealth: Payer: Self-pay

## 2022-07-13 ENCOUNTER — Other Ambulatory Visit: Payer: Self-pay | Admitting: Nurse Practitioner

## 2022-07-13 DIAGNOSIS — F32A Depression, unspecified: Secondary | ICD-10-CM

## 2022-07-13 MED ORDER — SERTRALINE HCL 100 MG PO TABS: 150.0000 mg | ORAL_TABLET | Freq: Every day | ORAL | 2 refills | Status: DC

## 2022-07-13 NOTE — Telephone Encounter (Signed)
Zoloft only comes in 25 mg, 50 mg, and 100 mg dosages. Continue taking as currently taking.

## 2022-07-14 NOTE — Telephone Encounter (Signed)
Per provider prescription was sent to pharmacy with  correct dose. [place]

## 2022-07-18 ENCOUNTER — Encounter: Payer: 59 | Attending: Physical Medicine & Rehabilitation | Admitting: Physical Medicine & Rehabilitation

## 2022-07-18 ENCOUNTER — Encounter: Payer: Self-pay | Admitting: Physical Medicine & Rehabilitation

## 2022-07-18 VITALS — BP 121/79 | HR 86 | Ht 65.0 in | Wt 249.0 lb

## 2022-07-18 DIAGNOSIS — M5442 Lumbago with sciatica, left side: Secondary | ICD-10-CM | POA: Insufficient documentation

## 2022-07-18 DIAGNOSIS — F32A Depression, unspecified: Secondary | ICD-10-CM | POA: Diagnosis not present

## 2022-07-18 DIAGNOSIS — M797 Fibromyalgia: Secondary | ICD-10-CM | POA: Diagnosis present

## 2022-07-18 DIAGNOSIS — G8929 Other chronic pain: Secondary | ICD-10-CM | POA: Insufficient documentation

## 2022-07-18 NOTE — Progress Notes (Signed)
Subjective:    Patient ID: Erin Zamora, female    DOB: 01-Jun-1970, 52 y.o.   MRN: [id number]  HPI  Erin Zamora is a 52 y.o. year old female  who  has a past medical history of Allergy, Anemia, Anxiety, CAP (community acquired pneumonia) (10/2018), COPD (chronic obstructive pulmonary disease) ([place]), Cough (10/2018), Dyspnea, Fibromyalgia, Hyperlipidemia (10/2018), Hypertension, Hypokalemia, Insomnia (10/2018), Microcytic anemia, Pre-diabetes, Skin lesions (10/2019), Thrombocytosis, and Vitamin D deficiency (10/2018).   They are presenting to PM&R clinic as a new patient for pain management evaluation.  She reports that her back and leg pain began about 5 to 6 years ago.  She has had back pain shooting down her left buttocks, hip, leg, front and back of her thigh.  She also has paresthesias on her lateral ankle and dorsal left foot.  She reports this sensation in her foot started about a year before her back surgery.  On 02/16/2022 she had a microdiscectomy on the left at L5-S1 and L4-L5 by Dr. Kary Kos.  She had the surgery for severe spinal stenosis at L4-L5 and disc herniation L5-S1.  It is noted that she was having radiating pain in the L5 and S1 nerve root distributions before this.  She worked with PT after the surgery.  She reports that the pain has not significantly improved since the surgery.  She feels that the pain shooting down her leg is currently worse in severity than the pain in her lower back.  She reports doing a lot for family at home and she is currently working.  At work she typically leans forward on a counter to help the pain.  Standing worsens the pain, sitting also causes pain.  Laying makes the pain worse.  Her ability to do activities is limited by the pain.  She reports she is very afraid of activity is making the pain worse. Additionally she has a history of fibromyalgia.  She reports she was seen by rheumatology to rule out other possible conditions however  she says rheumatology felt that fibromyalgia was the most likely diagnosis.  She says she has a sister who has rheumatoid arthritis and ankylosing spondylitis.  She has history of anxiety and depression.  Reports poor sleep.     Red flag symptoms: No red flags for back pain endorsed in Hx or ROS, saddle anesthesia, loss of bowel or bladder continence, new weakness, new numbness/tingling, and pain waking up at nighttime.  She continues to have back pain that radiates down her left leg mostly into her thigh.  She does continue to use marijuana for her pain.  Patient does not want to change sertraline to a different medication because it is working well for her depression.   Medications tried: Tylenol doesn't help Ibuprofen- Helps a 800mg  dose Cortisone- helped but caused glucose issues Gabapentin- dont take it, only at night, horrible if I take it during the day, tried 100mg  dose Lyrica has not tried Hydrocodone- after her surgery- not using recently Tramadol- doesn't remember, appears she may have been ordered this previously Cymbalta didn't help in the past, this was years ago Waynard Edwards helps her pain and anxiety       Other Treatments: PT- helped function, minimal benefit the pain, she did not like dry needling ESI- helped for a week TENS- didn't help    Interval history 07/18/2022 Erin Zamora is here for follow-up of her chronic pain.  She reports she has started taking Lyrica 25 mg.  It provides  minimal benefit to her pain currently, however she does not take it as regularly during the day due to sedation.   Pain Inventory Average Pain 6 Pain Right Now 6 My pain is constant, dull, stabbing, tingling, and aching  In the last 24 hours, has pain interfered with the following? General activity 7 Relation with others 3 Enjoyment of life 7 What TIME of day is your pain at its worst? evening Sleep (in general) Poor  Pain is worse with: sitting, standing, and some activites Pain  improves with: medication Relief from Meds: 5  Family History  Problem Relation Age of Onset   Alcohol abuse Mother    COPD Mother    Hypertension Mother    Stroke Mother    Alcohol abuse Father    Diabetes Father    Drug abuse Father    Heart disease Father    Hypertension Father    Kidney disease Father    Congestive Heart Failure Father    Heart attack Father    Stroke Father    Rheum arthritis Sister    Ankylosing spondylitis Sister    Arthritis Maternal Grandmother    Coronary artery disease Maternal Grandmother    Arthritis Maternal Grandfather    Melanoma Maternal Grandfather    Cancer Paternal Grandmother    Diabetes Paternal Grandmother    Heart disease Paternal Grandmother    Heart attack Paternal Grandmother    Diabetes Paternal Grandfather    Heart disease Paternal Grandfather    Kidney disease Paternal Grandfather    Stroke Paternal Grandfather    Hypertension Daughter    Obesity Daughter    Social History   Socioeconomic History   Marital status: Single    Spouse name: Not on file   Number of children: Not on file   Years of education: Not on file   Highest education level: Not on file  Occupational History   Not on file  Tobacco Use   Smoking status: Every Day    Packs/day: 0.50    Years: 25.00    Additional pack years: 0.00    Total pack years: 12.50    Types: Cigarettes    Passive exposure: Never   Smokeless tobacco: Never   Tobacco comments:    3/4 pack a day  Vaping Use   Vaping Use: Former  Substance and Sexual Activity   Alcohol use: Yes    Alcohol/week: 0.0 standard drinks of alcohol    Comment: rare   Drug use: Yes    Types: Marijuana    Comment: daily   Sexual activity: Yes    Partners: Male    Birth control/protection: None  Other Topics Concern   Not on file  Social History Narrative   51 year old and 57 y/o daughters   19 year old granddaughter - takes care of her in the afternoon after school   Significant other - 15  years   Work: Web designer   Social Determinants of Radio broadcast assistant Strain: Not on file  Food Insecurity: No [place] (02/19/2022)   Hunger Vital Sign    Worried About Running Out of Food in the Last Year: Never true    [place] in the Last Year: Never true  Transportation Needs: No Transportation Needs (02/19/2022)   PRAPARE - Hydrologist (Medical): No    Lack of Transportation (Non-Medical): No  Physical Activity: Not on file  Stress: Not on file  Social Connections: Not on file   Past Surgical History:  Procedure Laterality Date   APPENDECTOMY  05/02/1989   CHOLECYSTECTOMY  05/02/2001   COLONOSCOPY WITH PROPOFOL N/A 05/18/2021   Procedure: COLONOSCOPY WITH PROPOFOL;  Surgeon: Jonathon Bellows, MD;  Location: [facility] ENDOSCOPY;  Service: Gastroenterology;  Laterality: N/A;   LUMBAR LAMINECTOMY/DECOMPRESSION MICRODISCECTOMY Left 02/16/2022   Procedure: Left - Lumbar Four-Lumbar Five laminectomy and foraminotomy, Left - Lumbar Five-Sacral One microdiskectomy;  Surgeon: Kary Kos, MD;  Location: [place];  Service: Neurosurgery;  Laterality: Left;  3C   LUMBAR LAMINECTOMY/DECOMPRESSION MICRODISCECTOMY N/A 02/18/2022   Procedure: Lumbar Five - Sacral One re-exploration of diskectomy;  Surgeon: Kary Kos, MD;  Location: [place];  Service: Neurosurgery;  Laterality: N/A;   TRIGGER FINGER RELEASE Right    TUBAL LIGATION  05/02/2001   Past Surgical History:  Procedure Laterality Date   APPENDECTOMY  05/02/1989   CHOLECYSTECTOMY  05/02/2001   COLONOSCOPY WITH PROPOFOL N/A 05/18/2021   Procedure: COLONOSCOPY WITH PROPOFOL;  Surgeon: Jonathon Bellows, MD;  Location: [facility] ENDOSCOPY;  Service: Gastroenterology;  Laterality: N/A;   LUMBAR LAMINECTOMY/DECOMPRESSION MICRODISCECTOMY Left 02/16/2022   Procedure: Left - Lumbar Four-Lumbar Five laminectomy and foraminotomy, Left - Lumbar Five-Sacral One microdiskectomy;  Surgeon: Kary Kos, MD;   Location: [place];  Service: Neurosurgery;  Laterality: Left;  3C   LUMBAR LAMINECTOMY/DECOMPRESSION MICRODISCECTOMY N/A 02/18/2022   Procedure: Lumbar Five - Sacral One re-exploration of diskectomy;  Surgeon: Kary Kos, MD;  Location: [place];  Service: Neurosurgery;  Laterality: N/A;   TRIGGER FINGER RELEASE Right    TUBAL LIGATION  05/02/2001   Past Medical History:  Diagnosis Date   Allergy    seasonal   Anemia    Anxiety    Asthma    CAP (community acquired pneumonia) 10/2018   Chest pain    Clotting disorder (HCC)    COPD (chronic obstructive pulmonary disease) (HCC)    Cough 10/2018   Dizziness    Dyspnea    Fibromyalgia    Hyperlipidemia 10/2018   Hypertension    Hypokalemia    Insomnia 10/2018   Microcytic anemia    Pre-diabetes    Skin lesions 10/2019   Thrombocytosis    Vitamin D deficiency 10/2018   BP 121/79   Pulse 86   Ht 5\' 5"  (1.651 m)   Wt 249 lb (112.9 kg)   LMP 10/30/2021   SpO2 96%   BMI 41.44 kg/m   Opioid Risk Score:   Fall Risk Score:  `1  Depression screen [facility] 2/9     07/18/2022    2:38 PM 05/31/2022    9:20 AM 10/05/2021    9:16 AM 04/06/2021    9:01 AM 01/05/2021    2:21 PM 07/24/2020    3:03 PM 01/24/2020    2:49 PM  Depression screen PHQ 2/9  Decreased Interest 0 1 0 0 0 0 0  Down, Depressed, Hopeless 0 2 3 1  0 0 0  PHQ - 2 Score 0 3 3 1  0 0 0  Altered sleeping  2 3 2      Tired, decreased energy  2 3 2      Change in appetite  1 0      Feeling bad or failure about yourself   1 0 0     Trouble concentrating  2 1 1      Moving slowly or fidgety/restless  2 0 0     Suicidal thoughts  0 0 0  PHQ-9 Score  13 10 6      Difficult doing work/chores  Somewhat difficult           Review of Systems  Musculoskeletal:  Positive for back pain.       Right shoulder pain Left thigh pain  All other systems reviewed and are negative.     Objective:   Physical Exam  Gen: no distress, normal appearing HEENT: oral mucosa pink and moist,  NCAT Chest: normal effort, normal rate of breathing Abd: soft, non-distended Ext: no edema Psych: pleasant, normal affect Skin: intact Neuro: Alert and oriented, follows commands, cranial nerves II through XII grossly intact, normal speech and language, no ataxia or dysmetria Strength 5 out of 5 in bilateral upper and lower extremities Sensation intact light touch in all 4 extremities Hoffmann's negative no ankle clonus DTR 1 at bilateral ankle, 2 right knee, 1 left knee Musculoskeletal:   Paraspinal muscle tenderness in the L-spine left greater than right Altered sensation in the L5 left lower extremity distribution SLR positive on Left, negative on the right FABER positive on the left SI compression positive on the left Hip movement in all directions resulted in worsening of her L5 distribution pain  Midly Lmited S spine movement in all directions She does have tenderness to palpation to a lesser degree throughout her upper and lower extremities.  Pain with palpation of her bilateral shoulders, elbows, wrist, knees, ankles, hips   Lspine MRI 03/31/22 IMPRESSION: 1. Interval evacuation of posterior epidural hematoma at L4-5 and L5-S1 with improved patency of the thecal sac at both levels. Mild residual right lateral recess and biforaminal narrowing at L4-5 without definite nerve root encroachment. 2. Stable disc protrusions at T12-L1 and L1-2 without apparent nerve root encroachment. Chronic extraforaminal disc protrusions on the left at L2-3 and L3-4 with possible resulting chronic left-sided nerve root encroachment are unchanged. 3. No acute findings.      Assessment & Plan:  Prior UDS results: No results found for: "LABOPIA", "COCAINSCRNUR", "LABBENZ", "AMPHETMU", "THCU", "LABBARB"  Assessment and Plan: Erin Zamora is a 52 y.o. year old female  who  has a past medical history of Allergy, Anemia, Anxiety, CAP (community acquired pneumonia) (10/2018), COPD (chronic  obstructive pulmonary disease) ([place]), Cough (10/2018), Dyspnea, Fibromyalgia, Hyperlipidemia (10/2018), Hypertension, Hypokalemia, Insomnia (10/2018), Microcytic anemia, Pre-diabetes, Skin lesions (10/2019), Thrombocytosis, and Vitamin D deficiency (10/2018).   They are presenting to PM&R clinic as a new patient for treatment of back pain.   Chronic lower back pain left greater than right with left L5 distribution sciatica.  -02/16/2022 she had a microdiscectomy on the left at L5-S1 and L4-L5 by Dr. Kary Kos -ORT moderate  -She would need to discontinue use of marijuana if opioid medications were to be considered. She continues use of this substance -Possible left SI joint pain contributing to lumbar back pain, injection could be considered at a later time -Continue Lyrica 25 mg 2-3 times daily,  limited by sedation -PT consult placed -Consider alternative non-antidepressant neuropathic pain medication next visit    Fibromyalgia -Continue sertraline, retrying SNRI could be an option at a later time, pt would not like to change medication as this is working well for her mood  -Pool therapy consult placed   Depression -Continue sertraline as above

## 2022-07-27 ENCOUNTER — Telehealth (HOSPITAL_COMMUNITY): Payer: Self-pay | Admitting: *Deleted

## 2022-07-27 NOTE — Telephone Encounter (Signed)
Reaching out to patient to offer assistance regarding upcoming cardiac imaging study; pt verbalizes understanding of appt date/time, parking situation and where to check in, pre-test NPO status and medications ordered, and verified current allergies; name and call back number provided for further questions should they arise  Erin Kuhnert RN Navigator Cardiac Imaging [facility] [phone] office [phone] cell  Patient to take 100mg metoprolol tartrate two hours prior to her cardiac CT scan. She is aware to arrive at 1pm. 

## 2022-07-28 ENCOUNTER — Ambulatory Visit (HOSPITAL_COMMUNITY)
Admission: RE | Admit: 2022-07-28 | Discharge: 2022-07-28 | Disposition: A | Discharge status: Home / Self Care | Payer: 59 | Source: Ambulatory Visit | Attending: Interventional Cardiology | Admitting: Interventional Cardiology

## 2022-07-28 ENCOUNTER — Ambulatory Visit (HOSPITAL_BASED_OUTPATIENT_CLINIC_OR_DEPARTMENT_OTHER): Payer: 59

## 2022-07-28 DIAGNOSIS — R072 Precordial pain: Secondary | ICD-10-CM | POA: Diagnosis present

## 2022-07-28 MED ORDER — IOHEXOL 350 MG/ML SOLN
100.0000 mL | Freq: Once | INTRAVENOUS | Status: AC | PRN
  Administered 2022-07-28: 100 mL via INTRAVENOUS

## 2022-07-28 MED ORDER — NITROGLYCERIN 0.4 MG SL SUBL: 0.8000 mg | SUBLINGUAL_TABLET | Freq: Once | SUBLINGUAL | Status: AC

## 2022-07-28 MED ORDER — NITROGLYCERIN 0.4 MG SL SUBL
SUBLINGUAL_TABLET | SUBLINGUAL | Status: AC
  Administered 2022-07-28: 0.8 mg via SUBLINGUAL
  Filled 2022-07-28: qty 2

## 2022-07-29 ENCOUNTER — Telehealth: Payer: Self-pay

## 2022-07-29 DIAGNOSIS — E785 Hyperlipidemia, unspecified: Secondary | ICD-10-CM

## 2022-07-29 DIAGNOSIS — I2510 Atherosclerotic heart disease of native coronary artery without angina pectoris: Secondary | ICD-10-CM

## 2022-07-29 MED ORDER — ROSUVASTATIN CALCIUM 20 MG PO TABS: 20.0000 mg | ORAL_TABLET | Freq: Every day | ORAL | 3 refills | Status: DC

## 2022-07-29 NOTE — Telephone Encounter (Signed)
Called to discuss CT coronary calcium score results with patient, no answer, left message per DPR asking patient to call the office.

## 2022-07-29 NOTE — Telephone Encounter (Signed)
Reviewed Coronary CT calcium score results with patient, patient verbalizes understanding of mild coronary artery calcification.  Discussed Dr. Irish Lack recommendation to startrosuvastatin 20 mg daily with follow-up liver and lipid tests in 3 months.  Also advised that healthy diet and smoking cessation can be helpful with reducing risks. Orders placed for labs and crestor, labs scheduled as well as recall for  Follow-up in 1 year.

## 2022-07-29 NOTE — Telephone Encounter (Signed)
-----   Message from Jettie Booze, MD sent at 07/28/2022  3:25 PM EDT ----- Mild, nonobstructive CAD.  Mild coronary artery calcification but due to her young age, she falls into the 96rd percentile.  Would recommend rosuvastatin 20 mg daily with follow-up liver and lipid tests in 3 months.  She needs to stop smoking and eat a healthy diet.  Follow-up in 1 year

## 2022-08-02 ENCOUNTER — Telehealth: Payer: Self-pay

## 2022-08-02 LAB — ECHOCARDIOGRAM COMPLETE
Area-P 1/2: 3.72 cm2
S' Lateral: 3.1 cm

## 2022-08-02 NOTE — Telephone Encounter (Signed)
Patient is aware of lab results. Verbalized understanding. 

## 2022-08-02 NOTE — Telephone Encounter (Signed)
-----   Message from Jettie Booze, MD sent at 08/02/2022  3:25 PM EDT ----- Normal LV/RV/valvular functon

## 2022-09-16 ENCOUNTER — Ambulatory Visit: Payer: 59 | Admitting: Physical Medicine & Rehabilitation

## 2022-09-17 ENCOUNTER — Telehealth (HOSPITAL_BASED_OUTPATIENT_CLINIC_OR_DEPARTMENT_OTHER): Payer: Self-pay | Admitting: Physical Therapy

## 2022-09-17 ENCOUNTER — Ambulatory Visit (HOSPITAL_BASED_OUTPATIENT_CLINIC_OR_DEPARTMENT_OTHER): Payer: 59 | Attending: Physical Medicine & Rehabilitation | Admitting: Physical Therapy

## 2022-09-17 NOTE — Therapy (Deleted)
OUTPATIENT PHYSICAL THERAPY THORACOLUMBAR EVALUATION   Patient Name: Erin Zamora MRN: [id number] DOB:1971/04/18, 52 y.o., female Today's Date: 09/17/2022  END OF SESSION:   Past Medical History:  Diagnosis Date   Allergy    seasonal   Anemia    Anxiety    Asthma    CAP (community acquired pneumonia) 10/2018   Chest pain    Clotting disorder (HCC)    COPD (chronic obstructive pulmonary disease) (HCC)    Cough 10/2018   Dizziness    Dyspnea    Fibromyalgia    Hyperlipidemia 10/2018   Hypertension    Hypokalemia    Insomnia 10/2018   Microcytic anemia    Pre-diabetes    Skin lesions 10/2019   Thrombocytosis    Vitamin D deficiency 10/2018   Past Surgical History:  Procedure Laterality Date   APPENDECTOMY  05/02/1989   CHOLECYSTECTOMY  05/02/2001   COLONOSCOPY WITH PROPOFOL N/A 05/18/2021   Procedure: COLONOSCOPY WITH PROPOFOL;  Surgeon: Wyline Mood, MD;  Location: [facility] ENDOSCOPY;  Service: Gastroenterology;  Laterality: N/A;   LUMBAR LAMINECTOMY/DECOMPRESSION MICRODISCECTOMY Left 02/16/2022   Procedure: Left - Lumbar Four-Lumbar Five laminectomy and foraminotomy, Left - Lumbar Five-Sacral One microdiskectomy;  Surgeon: Donalee Citrin, MD;  Location: [facility] OR;  Service: Neurosurgery;  Laterality: Left;  3C   LUMBAR LAMINECTOMY/DECOMPRESSION MICRODISCECTOMY N/A 02/18/2022   Procedure: Lumbar Five - Sacral One re-exploration of diskectomy;  Surgeon: Donalee Citrin, MD;  Location: [facility] OR;  Service: Neurosurgery;  Laterality: N/A;   TRIGGER FINGER RELEASE Right    TUBAL LIGATION  05/02/2001   Patient Active Problem List   Diagnosis Date Noted   HNP (herniated nucleus pulposus), lumbar 02/16/2022   Hidradenitis 01/14/2022   Fibromyalgia syndrome 01/14/2022   Bilateral hand pain 01/14/2022   Recurrent major depressive disorder, in full remission (HCC) 06/11/2020   Protrusion of lumbar intervertebral disc 03/25/2020   Spinal stenosis of lumbar region 03/25/2020   Skin  lesions, generalized 12/02/2019   Recurrent major depressive disorder, in partial remission (HCC) 09/23/2019   MDD (major depressive disorder), recurrent episode, moderate (HCC) 07/17/2019   Generalized anxiety disorder 07/17/2019   Paranoia (HCC) 05/26/2019   Chronic left-sided low back pain with left-sided sciatica 01/01/2019   Numbness and tingling of left lower extremity 01/01/2019   Iron deficiency anemia 11/09/2018   Thrombocytosis 11/03/2018   COPD with chronic bronchitis and emphysema (HCC) 10/29/2018   Cough 10/23/2018   Chest congestion 10/23/2018   Anxiety 10/23/2018   Anemia 10/23/2018   RLL pneumonia 10/13/2018   Elevated blood pressure reading 10/13/2018   Obesity (BMI 30.0-34.9) 10/13/2018   Vitamin D deficiency 10/2018    PCP: Ivonne Andrew, NP   REFERRING PROVIDER: Fanny Dance, MD   REFERRING DIAG: M79.7 (ICD-10-CM) - Fibromyalgia   Rationale for Evaluation and Treatment: Rehabilitation  THERAPY DIAG:  No diagnosis found.  ONSET DATE: ***  SUBJECTIVE:  SUBJECTIVE STATEMENT: Pin in leg is worse than in back  PERTINENT HISTORY:  On 02/16/2022 she had a microdiscectomy on the left at L5-S1 and L4-L5 by Dr. Donalee Citrin. She had the surgery for severe spinal stenosis at L4-L5 and disc herniation L5-S1. It is noted that she was having radiating pain in the L5 and S1 nerve root distributions before this. PT assisted with function but didn't help the pain Chroinc LBP; Fibromyalgia; depression  PAIN:  Yes: NPRS scale: 0/10 Pain location: *** Pain description: *** Aggravating factors: standing, sitting and lying Relieving factors: *** PRECAUTIONS: {Therapy precautions:24002}  WEIGHT BEARING RESTRICTIONS: {Yes ***/No:24003}  FALLS:  Has patient fallen in last 6 months?  {fallsyesno:27318}  LIVING ENVIRONMENT: Lives with: {OPRC lives with:25569::"lives with their family"} Lives in: {Lives in:25570} Stairs: {opstairs:27293} Has following equipment at home: {Assistive devices:23999}  OCCUPATION: ***  PLOF: {PLOF:24004}  PATIENT GOALS: ***    OBJECTIVE:   DIAGNOSTIC FINDINGS:  ***  PATIENT SURVEYS:  FOTO ***  SCREENING FOR RED FLAGS: Bowel or bladder incontinence: No Spinal tumors: No Cauda equina syndrome: No Compression fracture: No Abdominal aneurysm: No  COGNITION: Overall cognitive status: Within functional limits for tasks assessed     SENSATION: paresthesias on her lateral ankle and dorsal left foot   MUSCLE LENGTH:   POSTURE: {posture:25561}  PALPATION: ***  LUMBAR ROM:   AROM eval  Flexion   Extension   Right lateral flexion   Left lateral flexion   Right rotation   Left rotation    (Blank rows = not tested)  LOWER EXTREMITY ROM:     {AROM/PROM:27142}  Right eval Left eval  Hip flexion    Hip extension    Hip abduction    Hip adduction    Hip internal rotation    Hip external rotation    Knee flexion    Knee extension    Ankle dorsiflexion    Ankle plantarflexion    Ankle inversion    Ankle eversion     (Blank rows = not tested)  LOWER EXTREMITY MMT:    MMT Right eval Left eval  Hip flexion    Hip extension    Hip abduction    Hip adduction    Hip internal rotation    Hip external rotation    Knee flexion    Knee extension    Ankle dorsiflexion    Ankle plantarflexion    Ankle inversion    Ankle eversion     (Blank rows = not tested)    FUNCTIONAL TESTS:  5 times sit to stand: *** Timed up and go (TUG): ***  GAIT: Distance walked: *** Assistive device utilized: {Assistive devices:23999} Level of assistance: {Levels of assistance:24026} Comments: ***  TODAY'S TREATMENT:                                                                                                                               DATE: ***  PATIENT EDUCATION:  Education details: Discussed eval findings, rehab rationale, aquatic program progression/POC and pools in area. Patient is in agreement  Person educated: Patient Education method: Explanation/handouts Education comprehension: verbalized understanding  HOME EXERCISE PROGRAM: ***  ASSESSMENT:  CLINICAL IMPRESSION: Patient is a 52 y.o. f who was seen today for physical therapy evaluation and treatment for ***.Chronic lower back pain left greater than right with left L5 distribution sciatica.    OBJECTIVE IMPAIRMENTS: {opptimpairments:25111}.   ACTIVITY LIMITATIONS: {activitylimitations:27494}  PARTICIPATION LIMITATIONS: {participationrestrictions:25113}  PERSONAL FACTORS: {Personal factors:25162} are also affecting patient's functional outcome.   REHAB POTENTIAL: {rehabpotential:25112}  CLINICAL DECISION MAKING: {clinical decision making:25114}  EVALUATION COMPLEXITY: {Evaluation complexity:25115}   GOALS: Goals reviewed with patient? Yes  SHORT TERM GOALS: Target date: ***  Pt will tolerate full aquatic sessions consistently without increase in pain and with improving function to demonstrate good toleration and effectiveness of intervention.   Baseline: Goal status: {GOALSTATUS:25110}  2.  Pt will tolerate walking to and from setting and engaging in aquatic therapy session without excessive fatigue or increase in pain to demonstrate improved toleration to activity.  Baseline:  Goal status: {GOALSTATUS:25110}  3.  Pt will improve on 5 X STS test to <or=    to demonstrate improving functional lower extremity strength, transitional movements, and balance  Baseline:  Goal status: {GOALSTATUS:25110}  4.  *** Baseline:  Goal status: {GOALSTATUS:25110}  5.  *** Baseline:  Goal status: {GOALSTATUS:25110}  6.  *** Baseline:  Goal status: {GOALSTATUS:25110}  LONG TERM GOALS: Target date: ***  Pt to meet stated  Foto Goal Baseline:  Goal status: {GOALSTATUS:25110}  2.  Pt will be indep with final HEP's (land and aquatic as appropriate) for continued management of condition Baseline:  Goal status: {GOALSTATUS:25110}  3.  Pt will improve strength in all areas listed by    to demonstrate improved overall physical function  Baseline:  Goal status: {GOALSTATUS:25110}  4.  *** Baseline:  Goal status: {GOALSTATUS:25110}  5.  *** Baseline:  Goal status: {GOALSTATUS:25110}  6.  *** Baseline:  Goal status: {GOALSTATUS:25110}  PLAN:  PT FREQUENCY: {rehab frequency:25116}  PT DURATION: {rehab duration:25117}  PLANNED INTERVENTIONS: Therapeutic exercises, Therapeutic activity, Neuromuscular re-education, Balance training, Gait training, Patient/Family education, Self Care, Joint mobilization, Stair training, Orthotic/Fit training, DME instructions, Aquatic Therapy, Dry Needling, Cryotherapy, Moist heat, Taping, Ionotophoresis 4mg /ml Dexamethasone, Manual therapy, and Re-evaluation.  PLAN FOR NEXT SESSION: Geni Bers, PT 09/17/2022, 7:15 AM

## 2022-09-17 NOTE — Telephone Encounter (Signed)
Pt sent me to VM. Left message regard ing missed visit this morning. Requested she give [place] a call back to reschedule in the event she wants to follow through with referral.

## 2022-09-23 ENCOUNTER — Encounter: Payer: 59 | Attending: Physical Medicine & Rehabilitation | Admitting: Physical Medicine & Rehabilitation

## 2022-09-23 DIAGNOSIS — M5442 Lumbago with sciatica, left side: Secondary | ICD-10-CM | POA: Insufficient documentation

## 2022-09-23 DIAGNOSIS — G8929 Other chronic pain: Secondary | ICD-10-CM | POA: Insufficient documentation

## 2022-09-23 DIAGNOSIS — M797 Fibromyalgia: Secondary | ICD-10-CM | POA: Insufficient documentation

## 2022-09-23 DIAGNOSIS — F32A Depression, unspecified: Secondary | ICD-10-CM | POA: Insufficient documentation

## 2022-10-06 ENCOUNTER — Ambulatory Visit (INDEPENDENT_AMBULATORY_CARE_PROVIDER_SITE_OTHER): Payer: 59 | Admitting: Nurse Practitioner

## 2022-10-06 ENCOUNTER — Encounter: Payer: Self-pay | Admitting: Nurse Practitioner

## 2022-10-06 VITALS — BP 136/84 | HR 78 | Temp 97.9°F | Ht 65.0 in | Wt 250.4 lb

## 2022-10-06 DIAGNOSIS — Z13220 Encounter for screening for lipoid disorders: Secondary | ICD-10-CM

## 2022-10-06 DIAGNOSIS — I10 Essential (primary) hypertension: Secondary | ICD-10-CM

## 2022-10-06 DIAGNOSIS — R490 Dysphonia: Secondary | ICD-10-CM | POA: Diagnosis not present

## 2022-10-06 DIAGNOSIS — R413 Other amnesia: Secondary | ICD-10-CM

## 2022-10-06 DIAGNOSIS — R1012 Left upper quadrant pain: Secondary | ICD-10-CM

## 2022-10-06 DIAGNOSIS — Z131 Encounter for screening for diabetes mellitus: Secondary | ICD-10-CM | POA: Diagnosis not present

## 2022-10-06 DIAGNOSIS — Z1329 Encounter for screening for other suspected endocrine disorder: Secondary | ICD-10-CM | POA: Diagnosis not present

## 2022-10-06 NOTE — Assessment & Plan Note (Signed)
-   Ambulatory referral to ENT  2. Diabetes mellitus screening  - Hemoglobin A1c  3. Lipid screening  - Lipid Panel  4. Thyroid disorder screen  - TSH  5. Primary hypertension  - CBC - Comprehensive metabolic panel  6. Memory loss  - Ambulatory referral to Neurology  7. Left upper quadrant pain  - US Abdomen Complete  Follow up:  Follow up in 3 months

## 2022-10-06 NOTE — Patient Instructions (Signed)
1. Voice hoarseness  - Ambulatory referral to ENT  2. Diabetes mellitus screening  - Hemoglobin A1c  3. Lipid screening  - Lipid Panel  4. Thyroid disorder screen  - TSH  5. Primary hypertension  - CBC - Comprehensive metabolic panel  6. Memory loss  - Ambulatory referral to Neurology  7. Left upper quadrant pain  - US Abdomen Complete  Follow up:  Follow up in 3 months

## 2022-10-06 NOTE — Progress Notes (Signed)
@Patient  ID: Erin Zamora, female    DOB: July 22, 1970, 52 y.o.   MRN: [id number]  Chief Complaint  Patient presents with   Annual Exam    Referring provider: Orion Crook I, NP   HPI  Erin Zamora 52 y.o. female  has a past medical history of Allergy, Anemia, Anxiety, CAP (community acquired pneumonia) (10/2018), Cough (10/2018), Hyperlipidemia (10/2018), Hypokalemia, Insomnia (10/2018), Microcytic anemia, Skin lesions (10/2019), Thrombocytosis, and Vitamin D deficiency (10/2018).   Patient presents today for annual exam.  She has multiple complaints today.   Hoarse voice:  Patient complains of hoarse voice.  She states that this has been going on for several months.  She has tried multiple over-the-counter medications that have not helped.  We discussed that we will refer her to ENT for further evaluation.  Chronic pain: Patient complains of chronic pain.  She is since a pain specialist.  She is having to visit them regularly every month.  And is requesting that several other that we refill her Lyrica.  We discussed that we will have to follow-up at least every 3 months for this.  Memory Loss: Patient also complains of memory loss.  She states that several of her family members have been diagnosed with Alzheimer's.  She would like evaluation through neurology.  We will place referral today.  Left upper quadrant pain: Patient complains today of chronic left upper quadrant pain.  She denies any issues with vomiting diarrhea or constipation.  We will order ultrasound of her abdomen.  Denies f/c/s, n/v/d, hemoptysis, PND, leg swelling Denies chest pain or edema      Allergies  Allergen Reactions   Codeine Itching and Nausea And Vomiting     There is no immunization history on file for this patient.  Past Medical History:  Diagnosis Date   Allergy    seasonal   Anemia    Anxiety    Asthma    CAP (community acquired pneumonia) 10/2018   Chest pain     Clotting disorder (HCC)    COPD (chronic obstructive pulmonary disease) (HCC)    Cough 10/2018   Dizziness    Dyspnea    Fibromyalgia    Hyperlipidemia 10/2018   Hypertension    Hypokalemia    Insomnia 10/2018   Microcytic anemia    Pre-diabetes    Skin lesions 10/2019   Thrombocytosis    Vitamin D deficiency 10/2018    Tobacco History: Social History   Tobacco Use  Smoking Status Every Day   Packs/day: 0.50   Years: 25.00   Additional pack years: 0.00   Total pack years: 12.50   Types: Cigarettes   Passive exposure: Never  Smokeless Tobacco Never  Tobacco Comments   3/4 pack a day   Ready to quit: Not Answered Counseling given: Not Answered Tobacco comments: 3/4 pack a day   Outpatient Encounter Medications as of 10/06/2022  Medication Sig   ibuprofen (ADVIL) 200 MG tablet Take 800 mg by mouth every 4 (four) hours as needed for moderate pain.   pregabalin (LYRICA) 25 MG capsule Take 1 capsule (25 mg total) by mouth 3 (three) times daily.   sertraline (ZOLOFT) 100 MG tablet Take 1.5 tablets (150 mg total) by mouth daily.   Vitamin D, Ergocalciferol, (DRISDOL) 1.25 MG (50000 UNIT) CAPS capsule Take 1 capsule (50,000 Units total) by mouth every 7 (seven) days.   acetaminophen (TYLENOL) 325 MG tablet Take 2 tablets (650 mg total) by mouth every 6 (six)  hours as needed. (Patient not taking: Reported on 10/06/2022)   albuterol (VENTOLIN HFA) 108 (90 Base) MCG/ACT inhaler Inhale 1-2 puffs into the lungs every 6 (six) hours as needed for wheezing or shortness of breath. (Patient not taking: Reported on 10/06/2022)   metoprolol tartrate (LOPRESSOR) 100 MG tablet Take one tablet by mouth 2 hours prior to CT Scan (Patient not taking: Reported on 10/06/2022)   rosuvastatin (CRESTOR) 20 MG tablet Take 1 tablet (20 mg total) by mouth daily. (Patient not taking: Reported on 10/06/2022)   No facility-administered encounter medications on file as of 10/06/2022.     Review of  Systems  Review of Systems  Constitutional: Negative.   HENT: Negative.    Cardiovascular: Negative.   Gastrointestinal: Negative.   Allergic/Immunologic: Negative.   Neurological: Negative.   Psychiatric/Behavioral: Negative.         Physical Exam  BP 136/84   Pulse 78   Temp 97.9 F (36.6 C)   Ht 5\' 5"  (1.651 m)   Wt 250 lb 6.4 oz (113.6 kg)   LMP 10/30/2021   SpO2 98%   BMI 41.67 kg/m   Wt Readings from Last 5 Encounters:  10/06/22 250 lb 6.4 oz (113.6 kg)  07/18/22 249 lb (112.9 kg)  06/29/22 245 lb 12.8 oz (111.5 kg)  06/01/22 242 lb (109.8 kg)  05/31/22 240 lb (108.9 kg)     Physical Exam Vitals and nursing note reviewed.  Constitutional:      General: She is not in acute distress.    Appearance: She is well-developed.  Cardiovascular:     Rate and Rhythm: Normal rate and regular rhythm.  Pulmonary:     Effort: Pulmonary effort is normal.     Breath sounds: Normal breath sounds.  Neurological:     Mental Status: She is alert and oriented to person, place, and time.      Lab Results:  CBC    Component Value Date/Time   WBC 11.8 (H) 06/01/2022 1426   RBC 5.13 (H) 06/01/2022 1426   HGB 14.7 06/01/2022 1426   HGB 14.0 10/05/2021 0957   HCT 45.7 06/01/2022 1426   HCT 40.4 10/05/2021 0957   PLT 554 (H) 06/01/2022 1426   PLT 491 (H) 10/05/2021 0957   MCV 89.1 06/01/2022 1426   MCV 86 10/05/2021 0957   MCH 28.7 06/01/2022 1426   MCHC 32.2 06/01/2022 1426   RDW 13.6 06/01/2022 1426   RDW 13.6 10/05/2021 0957   LYMPHSABS 2.6 03/28/2022 1350   LYMPHSABS 2.3 10/05/2021 0957   MONOABS 0.8 03/28/2022 1350   EOSABS 0.3 03/28/2022 1350   EOSABS 0.4 10/05/2021 0957   BASOSABS 0.1 03/28/2022 1350   BASOSABS 0.1 10/05/2021 0957    BMET    Component Value Date/Time   NA 139 06/29/2022 1544   K 4.1 06/29/2022 1544   CL 97 06/29/2022 1544   CO2 29 06/29/2022 1544   GLUCOSE 115 (H) 06/29/2022 1544   GLUCOSE 115 (H) 06/01/2022 1426   BUN 6  06/29/2022 1544   CREATININE 0.67 06/29/2022 1544   CALCIUM 9.6 06/29/2022 1544   GFRNONAA >60 06/01/2022 1426   GFRAA >60 10/15/2018 0615    BNP    Component Value Date/Time   BNP 9.3 10/14/2018 0940    ProBNP No results found for: "PROBNP"  Imaging: No results found.   Assessment & Plan:   Voice hoarseness - Ambulatory referral to ENT  2. Diabetes mellitus screening  - Hemoglobin A1c  3. Lipid  screening  - Lipid Panel  4. Thyroid disorder screen  - TSH  5. Primary hypertension  - CBC - Comprehensive metabolic panel  6. Memory loss  - Ambulatory referral to Neurology  7. Left upper quadrant pain  - US Abdomen Complete  Follow up:  Follow up in 3 months     Ivonne Andrew, NP 10/06/2022

## 2022-10-07 LAB — CBC
Hematocrit: 44.8 % (ref 34.0–46.6)
Hemoglobin: 14.8 g/dL (ref 11.1–15.9)
MCH: 28.1 pg (ref 26.6–33.0)
MCHC: 33 g/dL (ref 31.5–35.7)
MCV: 85 fL (ref 79–97)
Platelets: 476 10*3/uL — ABNORMAL HIGH (ref 150–450)
RBC: 5.26 x10E6/uL (ref 3.77–5.28)
RDW: 13.6 % (ref 11.7–15.4)
WBC: 9.7 10*3/uL (ref 3.4–10.8)

## 2022-10-07 LAB — COMPREHENSIVE METABOLIC PANEL
ALT: 20 IU/L (ref 0–32)
AST: 20 IU/L (ref 0–40)
Albumin/Globulin Ratio: 1.8 (ref 1.2–2.2)
Albumin: 4.4 g/dL (ref 3.9–4.9)
Alkaline Phosphatase: 92 IU/L (ref 44–121)
BUN/Creatinine Ratio: 12 (ref 9–23)
BUN: 9 mg/dL (ref 6–24)
Bilirubin Total: 0.6 mg/dL (ref 0.0–1.2)
CO2: 24 mmol/L (ref 20–29)
Calcium: 9.8 mg/dL (ref 8.7–10.2)
Chloride: 98 mmol/L (ref 96–106)
Creatinine, Ser: 0.74 mg/dL (ref 0.57–1.00)
Globulin, Total: 2.5 g/dL (ref 1.5–4.5)
Glucose: 124 mg/dL — ABNORMAL HIGH (ref 70–99)
Potassium: 4.4 mmol/L (ref 3.5–5.2)
Sodium: 139 mmol/L (ref 134–144)
Total Protein: 6.9 g/dL (ref 6.0–8.5)
eGFR: 99 mL/min/{1.73_m2} (ref >59)

## 2022-10-07 LAB — LIPID PANEL
Chol/HDL Ratio: 4.8 ratio — ABNORMAL HIGH (ref 0.0–4.4)
Cholesterol, Total: 187 mg/dL (ref 100–199)
HDL: 39 mg/dL — ABNORMAL LOW (ref >39)
LDL Chol Calc (NIH): 117 mg/dL — ABNORMAL HIGH (ref 0–99)
Triglycerides: 175 mg/dL — ABNORMAL HIGH (ref 0–149)
VLDL Cholesterol Cal: 31 mg/dL (ref 5–40)

## 2022-10-07 LAB — TSH: TSH: 1.92 u[IU]/mL (ref 0.450–4.500)

## 2022-10-07 LAB — HEMOGLOBIN A1C
Est. average glucose Bld gHb Est-mCnc: 160 mg/dL
Hgb A1c MFr Bld: 7.2 % — ABNORMAL HIGH (ref 4.8–5.6)

## 2022-10-10 ENCOUNTER — Other Ambulatory Visit: Payer: Self-pay | Admitting: Nurse Practitioner

## 2022-10-10 DIAGNOSIS — I2510 Atherosclerotic heart disease of native coronary artery without angina pectoris: Secondary | ICD-10-CM

## 2022-10-10 DIAGNOSIS — E119 Type 2 diabetes mellitus without complications: Secondary | ICD-10-CM

## 2022-10-10 MED ORDER — ROSUVASTATIN CALCIUM 20 MG PO TABS: 20.0000 mg | ORAL_TABLET | Freq: Every day | ORAL | 3 refills | Status: DC

## 2022-10-11 ENCOUNTER — Encounter: Payer: Self-pay | Admitting: Oncology

## 2022-10-13 ENCOUNTER — Other Ambulatory Visit: Payer: Self-pay | Admitting: Nurse Practitioner

## 2022-10-13 MED ORDER — PREDNISONE 20 MG PO TABS: 20.0000 mg | ORAL_TABLET | Freq: Every day | ORAL | 0 refills | Status: AC

## 2022-10-26 ENCOUNTER — Ambulatory Visit: Payer: 59 | Attending: Nurse Practitioner

## 2022-10-27 ENCOUNTER — Telehealth: Payer: Self-pay | Admitting: Pharmacist

## 2022-10-27 ENCOUNTER — Other Ambulatory Visit: Payer: 59 | Admitting: Pharmacist

## 2022-10-27 MED ORDER — OZEMPIC (0.25 OR 0.5 MG/DOSE) 2 MG/3ML ~~LOC~~ SOPN: PEN_INJECTOR | SUBCUTANEOUS | 2 refills | Status: DC

## 2022-10-27 NOTE — Patient Instructions (Signed)
Evola,   It was great talking to you today!  Start Ozempic 0.25 mg weekly for 4 weeks, then increase to 0.5 mg weekly. This medication may cause stomach upset, queasiness, or constipation, especially when first starting. This generally improves over time. Call our office if these symptoms occur and worsen, or if you have severe symptoms such as vomiting, diarrhea, or stomach pain.    Check your blood sugars twice daily:  1) Fasting, first thing in the morning before breakfast and  2) 2 hours after your largest meal.   For a goal A1c of less than 7%, goal fasting readings are less than 130 and goal 2 hour after meal readings are less than 180.    Catie Eppie Gibson, PharmD, BCACP, CPP Clinical Pharmacist [facility] [phone]

## 2022-10-27 NOTE — Progress Notes (Signed)
10/27/2022 Name: CHARLOTTIE PERAGINE MRN: [id number] DOB: 1970/07/05  Chief Complaint  Patient presents with   Medication Management   Diabetes    Erin Zamora is a 52 y.o. year old female who presented for a telephone visit.   They were referred to the pharmacist by their PCP for assistance in managing diabetes.    Subjective:  Care Team: Primary Care Provider: Ivonne Andrew, NP ; Next Scheduled Visit: not scheduled  Medication Access/Adherence  Current Pharmacy:  [id number] - [place] ([place]), [place] - [address] [place] [address] Phone: [phone] Fax: [phone]   Patient reports affordability concerns with their medications: No  Patient reports access/transportation concerns to their pharmacy: No  Patient reports adherence concerns with their medications:  No     Diabetes:  Current medications: none  Reports recent health concerns that have related to increase in A1c. Has been working on dietary and lifestyle modifications for weight management, but is interested in GLP1 therapy to assistance.   Nutritional Patterns: - Breakfast: often skips; - Midday: grits from [employer] - Lunch: leftover rice, fish, vegetables; salads;  - Supper: tries to avoid fried foods and high carbohydrate foods;  - Dessert: has cut out sweets in the evening; cut out soda for the past 3 weeks;   Overall, works on minimizing carbohydrate portion sizes.   Physical activity; s/p back surgery in October  Hyperlipidemia/ASCVD Risk Reduction  Current lipid lowering medications: prescribed rosuvastatin 20 mg daily but would prefer to focus on lifestyle modifications first   Family History: father did have cardiovascular disease, but she does report other lifestyle choices    PREVENT Risk Score: 10 year risk of CVD: 7.7% - 10 year risk of ASCVD: 4.8% - 10 year risk of HF: 5.3%   Objective:  Lab Results   Component Value Date   HGBA1C 7.2 (H) 10/06/2022    Lab Results  Component Value Date   CREATININE 0.74 10/06/2022   BUN 9 10/06/2022   NA 139 10/06/2022   K 4.4 10/06/2022   CL 98 10/06/2022   CO2 24 10/06/2022    Lab Results  Component Value Date   CHOL 187 10/06/2022   HDL 39 (L) 10/06/2022   LDLCALC 117 (H) 10/06/2022   TRIG 175 (H) 10/06/2022   CHOLHDL 4.8 (H) 10/06/2022    Medications Reviewed Today     Reviewed by Alden Hipp, RPH-CPP (Pharmacist) on 10/27/22 at 1431  Med List Status: <None>   Medication Order Taking? Sig Documenting Provider Last Dose Status Informant  acetaminophen (TYLENOL) 325 MG tablet [id number]  Take 2 tablets (650 mg total) by mouth every 6 (six) hours as needed.  Patient not taking: Reported on 10/06/2022   Sloan Leiter, DO  Active Self           Med Note Alphonzo Dublin   Fri Feb 04, 2022  3:00 PM)    albuterol (VENTOLIN HFA) 108 (90 Base) MCG/ACT inhaler [id number]  Inhale 1-2 puffs into the lungs every 6 (six) hours as needed for wheezing or shortness of breath.  Patient not taking: Reported on 10/06/2022   [provider]  Active Self  ibuprofen (ADVIL) 200 MG tablet [id number] No Take 800 mg by mouth every 4 (four) hours as needed for moderate pain.  Patient not taking: Reported on 10/27/2022   [provider] Not Taking Active Self  magnesium chloride (SLOW-MAG) 64 MG TBEC SR tablet [id number]  Yes Take 1 tablet by mouth daily. [provider] Taking Active   metoprolol tartrate (LOPRESSOR) 100 MG tablet [id number] No Take one tablet by mouth 2 hours prior to CT Scan  Patient not taking: Reported on 10/06/2022   Corky Crafts, MD Not Taking Active   pregabalin (LYRICA) 25 MG capsule [id number] Yes Take 1 capsule (25 mg total) by mouth 3 (three) times daily. Fanny Dance, MD Taking Active            Med Note Clearance Coots, Alanda Slim Oct 27, 2022  2:26 PM) Taking 2 QPM  rosuvastatin (CRESTOR) 20  MG tablet [id number] No Take 1 tablet (20 mg total) by mouth daily.  Patient not taking: Reported on 10/27/2022   Ivonne Andrew, NP Not Taking Active   sertraline (ZOLOFT) 100 MG tablet [id number] Yes Take 1.5 tablets (150 mg total) by mouth daily. Ivonne Andrew, NP Taking Active   vitamin B-12 (CYANOCOBALAMIN) 100 MCG tablet [id number] Yes Take 100 mcg by mouth daily. [provider] Taking Active               Assessment/Plan:   Diabetes: - Currently uncontrolled - Reviewed long term cardiovascular and renal outcomes of uncontrolled blood sugar - Reviewed goal A1c, goal fasting, and goal 2 hour post prandial glucose - Reviewed dietary modifications including: focus on lean proteins, fruits and vegetables, whole grains - Reviewed lifestyle modifications including: focus on eventually goal of 150 minutes weekly - Recommend to pursue coverage GLP1. Counseled on mechanism of action, side effects. No known history of medullary thyroid cancer, MEN 2, personal history of pancreatitis or gallbladder disease. Will start PA.  Addendum: PA approved. Order placed per prior provider documentation.   Hyperlipidemia/ASCVD Risk Reduction: - Currently uncontrolled.  - Reviewed long term complications of uncontrolled cholesterol - Reviewed lifestyle recommendations as above - Agree to start with lifestyle modifications. If LDL does not achieve goal <24, patient is willing to start therapy.     Follow Up Plan: follow up 6 weeks  Catie Eppie Gibson, PharmD, BCACP, CPP Clinical Pharmacist [facility] [phone]

## 2022-10-27 NOTE — Progress Notes (Signed)
Attempted to contact patient for scheduled appointment for medication management. Left HIPAA compliant message for patient to return my call at their convenience.   Will send MyChart  Catie T. Remas Sobel, PharmD, BCACP, CPP Clinical Pharmacist [facility] [phone]  

## 2022-11-07 ENCOUNTER — Ambulatory Visit
Admission: RE | Admit: 2022-11-07 | Discharge: 2022-11-07 | Disposition: A | Discharge status: Home / Self Care | Payer: 59 | Source: Ambulatory Visit | Attending: Nurse Practitioner | Admitting: Nurse Practitioner

## 2022-11-10 ENCOUNTER — Other Ambulatory Visit: Payer: Self-pay | Admitting: Nurse Practitioner

## 2022-11-10 DIAGNOSIS — K760 Fatty (change of) liver, not elsewhere classified: Secondary | ICD-10-CM

## 2022-11-21 ENCOUNTER — Other Ambulatory Visit: Payer: Self-pay | Admitting: Nurse Practitioner

## 2022-11-21 DIAGNOSIS — F32A Depression, unspecified: Secondary | ICD-10-CM

## 2022-11-22 NOTE — Telephone Encounter (Signed)
Please advise Kh 

## 2022-12-06 ENCOUNTER — Other Ambulatory Visit: Payer: Self-pay | Admitting: Otolaryngology

## 2022-12-06 ENCOUNTER — Ambulatory Visit
Admission: RE | Admit: 2022-12-06 | Discharge: 2022-12-06 | Disposition: A | Discharge status: Home / Self Care | Payer: 59 | Source: Ambulatory Visit | Attending: Otolaryngology | Admitting: Otolaryngology

## 2022-12-06 DIAGNOSIS — R1310 Dysphagia, unspecified: Secondary | ICD-10-CM

## 2022-12-08 ENCOUNTER — Other Ambulatory Visit: Payer: 59 | Admitting: Pharmacist

## 2022-12-08 NOTE — Progress Notes (Signed)
12/08/2022 Name: Erin Zamora MRN: [id number] DOB: [birth year]  Chief Complaint  Patient presents with   Diabetes    DEZYRAE LUEBBERT is a 52 y.o. year old female who presented for a telephone visit. PMH includes T2DM, HTN, HLD, COPD, obesity, dysphagia (chronic throat clearer).    They were referred to the pharmacist by their PCP for assistance in managing diabetes and hyperlipidemia.   Subjective: Reports that she is doing wells. Denies significant AE with Ozempic. Is having some occasional stomach cramping randomly throughout the day, occasional upset stomach - but not bothering her too much. Associates the symptoms with eating foods higher in fats and sugars. Eating more fiber.  Has lost 16 lbs.   Dysphagia/difficulty swallowing liquids has been going on for ~1 yr. Did not notice any worsening since starting Ozempic. Recent appt with otolaryngology, had barium swallow, no esophageal abnormalities noted.    Pt has never checked her blood sugar at home at all. Recently diagnosed with diabetes at last PCP appt. Does not feel that she needs to check her blood sugars at this time.  Care Team: Primary Care Provider: Ivonne Andrew, NP ; Next Scheduled Visit: none scheduled  Medication Access/Adherence  Current Pharmacy:  [id number] - [place] ([place]), [place] - [address] [place] [address] Phone: [phone] Fax: [phone]   Patient reports affordability concerns with their medications: No  (Ozempic $25 copay) Patient reports access/transportation concerns to their pharmacy: No  Patient reports adherence concerns with their medications:  No     Diabetes:  Current medications: semaglutide 0.5 mg subcutaneous weekly on Fridays (tomorrow is third dose of 0.5)  Medications tried in the past: none  Patient is not checking BG at home. Does not have supplies. Is not interested at this time.   Patient denies  hypoglycemic s/sx including dizziness, shakiness, sweating. Patient denies hyperglycemic symptoms including polyuria, polydipsia, polyphagia, nocturia, neuropathy, blurred vision.  Nutritional Patterns: - Breakfast: often skips; - Midday: grits from [employer] - Lunch: leftover rice, fish, vegetables; salads;  - Supper: tries to avoid fried foods and high carbohydrate foods; eating more baked chicken, hamburger patty. No potatoes. Rice only a couple days a week - Dessert: has cut out sweets in the evening; cut out soda for the past 3 weeks;    Overall, works on minimizing carbohydrate portion sizes. If she goes out to eat, will remove bun/bread.    Physical activity; s/p back surgery in October, chronic pain   Hyperlipidemia/ASCVD Risk Reduction  Current lipid lowering medications: prescribed rosuvastation 20 mg daily but would prefer to focus on lifestyle modifications first.   Antiplatelet regimen:   ASCVD History:  Family History: stroke in mother, MI/stroke/DM in father Risk Factors:   PREVENT Risk Score: 10 year risk of CVD: 7.7% - 10 year risk of ASCVD: 4.8% - 10 year risk of HF: 5.3%   Objective:  Lab Results  Component Value Date   HGBA1C 7.2 (H) 10/06/2022    Lab Results  Component Value Date   CREATININE 0.74 10/06/2022   BUN 9 10/06/2022   NA 139 10/06/2022   K 4.4 10/06/2022   CL 98 10/06/2022   CO2 24 10/06/2022    Lab Results  Component Value Date   CHOL 187 10/06/2022   HDL 39 (L) 10/06/2022   LDLCALC 117 (H) 10/06/2022   TRIG 175 (H) 10/06/2022   CHOLHDL 4.8 (H) 10/06/2022    Medications Reviewed Today  Reviewed by Particia Lather, RPH (Pharmacist) on 12/08/22 at 1317  Med List Status: <None>   Medication Order Taking? Sig Documenting Provider Last Dose Status Informant  acetaminophen (TYLENOL) 325 MG tablet [id number] No Take 2 tablets (650 mg total) by mouth every 6 (six) hours as needed.  Patient not taking: Reported on 10/06/2022    Sloan Leiter, DO Not Taking Active Self           Med Note Alphonzo Dublin   Fri Feb 04, 2022  3:00 PM)    albuterol (VENTOLIN HFA) 108 (90 Base) MCG/ACT inhaler [id number] No Inhale 1-2 puffs into the lungs every 6 (six) hours as needed for wheezing or shortness of breath.  Patient not taking: Reported on 10/06/2022   [provider] Not Taking Active Self  ibuprofen (ADVIL) 200 MG tablet [id number] Yes Take 800 mg by mouth every 4 (four) hours as needed for moderate pain. [provider] Taking Active Self           Med Note Particia Lather   Thu Dec 08, 2022  1:08 PM) Taking 800 mg BID  magnesium chloride (SLOW-MAG) 64 MG TBEC SR tablet [id number] No Take 1 tablet by mouth daily.  Patient not taking: Reported on 12/08/2022   [provider] Not Taking Active   metoprolol tartrate (LOPRESSOR) 100 MG tablet [id number] No Take one tablet by mouth 2 hours prior to CT Scan  Patient not taking: Reported on 10/06/2022   Corky Crafts, MD Not Taking Active   pregabalin (LYRICA) 25 MG capsule [id number] Yes Take 1 capsule (25 mg total) by mouth 3 (three) times daily. Fanny Dance, MD Taking Active            Med Note Clearance Coots, Alanda Slim Oct 27, 2022  2:26 PM) Taking 2 QPM  rosuvastatin (CRESTOR) 20 MG tablet [id number] No Take 1 tablet (20 mg total) by mouth daily.  Patient not taking: Reported on 10/27/2022   Ivonne Andrew, NP Not Taking Active   Semaglutide,0.25 or 0.5MG /DOS, (OZEMPIC, 0.25 OR 0.5 MG/DOSE,) 2 MG/3ML SOPN [id number] Yes Inject 0.25 mg weekly for 4 weeks, then increase to 0.5 mg weekly. Ivonne Andrew, NP Taking Active   sertraline (ZOLOFT) 100 MG tablet [id number] Yes TAKE 1 & 1/2 (ONE & ONE-HALF) TABLETS BY MOUTH ONCE DAILY Ivonne Andrew, NP Taking Active   vitamin B-12 (CYANOCOBALAMIN) 100 MCG tablet [id number] No Take 100 mcg by mouth daily.  Patient not taking: Reported on 12/08/2022   [provider] Not Taking Active               Assessment/Plan:   Diabetes: - Currently uncontrolled per last A1c of 7.2% above goal <7%, however expect improvement since starting semaglutide (Ozempic) in June. Pt would like to finish current pen (0.5 mg x3 weeks), then increase to 1 mg subcutaneous weekly for additional BG control and continued weight loss. Continuation of GLP-1RA is appropriate given normal esophagram in the setting of chronic dysphagia.  - Reviewed long term cardiovascular and renal outcomes of uncontrolled blood sugar - Reviewed goal A1c, goal fasting, and goal 2 hour post prandial glucose - Reviewed dietary modifications including controlling portion size of carbohydrates, high fat foods - Reviewed lifestyle modifications including: increasing water intake, physical activity as able - Recommend to continue semaglutide 0.5 mg weekly (Fridays) for 3 weeks (finish pen), then increase to semaglutide 1 mg weekly.   - Encouraged pt to make  appointment to see PCP Angus Seller in early September. Repeat A1c will be due 01/06/23.   Hyperlipidemia/ASCVD Risk Reduction: - Currently uncontrolled with LDL-C of 117 mg/dL above goal <09 mg/dL, currently untreated. Patient prefers to manage with lifestyle at this time. Will recommend repeat lipid panel to reevaluate need for initiation of statin therapy at f/u.  - Reviewed long term complications of uncontrolled cholesterol - Recommend to repeat lipid panel at next PCP appt    Follow Up Plan: PCP early September (patient to schedule), pharmacist telephone 01/26/23.  Nils Pyle, PharmD PGY1 Pharmacy Resident

## 2022-12-08 NOTE — Progress Notes (Signed)
I have reviewed the pharmacist's encounter and agree with their documentation. PCP in agreement with Ozempic dose increase.   Catie Eppie Gibson, PharmD, BCACP, CPP [facility] [phone]

## 2022-12-08 NOTE — Patient Instructions (Signed)
Great work, Scientist, research (medical)!  Finish your current supply of Ozempic and continue to inject 0.5 mg weekly. Then increase to Ozempic 1 mg weekly. We will send a new prescription for your pharmacy to keep on file.   Make an appointment to see Erin Zamora in September.   Let us know if you have any questions or concerns!  Best,  Lou Cal

## 2022-12-09 MED ORDER — SEMAGLUTIDE (1 MG/DOSE) 4 MG/3ML ~~LOC~~ SOPN: 1.0000 mg | PEN_INJECTOR | SUBCUTANEOUS | 2 refills | Status: DC

## 2022-12-12 ENCOUNTER — Other Ambulatory Visit: Payer: Self-pay | Admitting: Nurse Practitioner

## 2022-12-12 DIAGNOSIS — F32A Depression, unspecified: Secondary | ICD-10-CM

## 2022-12-12 NOTE — Telephone Encounter (Signed)
Please advise Kh 

## 2022-12-13 ENCOUNTER — Other Ambulatory Visit: Payer: Self-pay | Admitting: Physical Medicine & Rehabilitation

## 2023-01-03 ENCOUNTER — Other Ambulatory Visit: Payer: Self-pay | Admitting: Nurse Practitioner

## 2023-01-03 DIAGNOSIS — F32A Depression, unspecified: Secondary | ICD-10-CM

## 2023-01-06 ENCOUNTER — Other Ambulatory Visit: Payer: Self-pay | Admitting: Pharmacist

## 2023-01-06 MED ORDER — SEMAGLUTIDE (1 MG/DOSE) 4 MG/3ML ~~LOC~~ SOPN: 1.0000 mg | PEN_INJECTOR | SUBCUTANEOUS | 2 refills | Status: DC

## 2023-01-06 NOTE — Progress Notes (Addendum)
Care Coordination Call  Order for Ozempic 1 mg weekly resent. Contacted [facility] - they have processed the prescription and will get ready for her today.   Catie Eppie Gibson, PharmD, BCACP, CPP Clinical Pharmacist [facility] [phone]

## 2023-01-10 ENCOUNTER — Ambulatory Visit: Payer: 59 | Admitting: Neurology

## 2023-01-10 ENCOUNTER — Encounter: Payer: Self-pay | Admitting: Neurology

## 2023-01-10 VITALS — BP 128/88 | Ht 65.0 in | Wt 231.0 lb

## 2023-01-10 DIAGNOSIS — G3184 Mild cognitive impairment, so stated: Secondary | ICD-10-CM | POA: Diagnosis not present

## 2023-01-10 NOTE — Progress Notes (Signed)
[facility]  PATIENT: Erin Zamora DOB: [birth year]  REQUESTING CLINICIAN: Ivonne Andrew, NP HISTORY FROM: Patient  REASON FOR VISIT: Memory loss    HISTORICAL  CHIEF COMPLAINT:  Chief Complaint  Patient presents with   New Patient (Initial Visit)    Rm 12, NP MOCA 20, memory changes     HISTORY OF PRESENT ILLNESS:  This is a 52 year old woman past medical history of fibromyalgia, chronic pain, anxiety/depression who is presenting with memory loss going on for the past year.  Patient recognizes she cannot remember as she used to.  She also reports in conversation she will lose her train of thought.  She report that her children have been complaining about her memory stating "do you not remember we have this conversation" or sometime patient will tell them information and they report this is the first time they discussed about it.  Now patient will always start a conversation with her family as "have we talked about this" then proceed to discuss the information.   She also reports at work she had difficulty with her performance.  She is not sure if she ordered items and need to always have to double check her work.   She also reports that her partner has complained about her memory loss.  She does have family history of dementia, both grandmothers with dementia and her paternal aunt with early onset dementia.  She stated that her mother was diagnosed with PSP. She lives at home with her partner, independent in all activities of daily living, she does drive, denies any recent accident.  She does miss some of her bills.   TBI:  No past history of TBI Stroke:   no past history of stroke Seizures:   no past history of seizures Sleep: no history of sleep apnea. But has loud snoring and apnea period (PCP will send referral)  Mood: patient with anxiety and depression, on Zoloft  Family history of Dementia: Both grand mother and paternal Aunt   Functional  status: independent in all ADLs and IADLs Patient lives with partner. Cooking: no issues Cleaning: no issues  Shopping: has to make a list  Bathing: no issues  Toileting: no issues  Driving: yes, no issues  Bills: Forget bills, been late on bills Ever left the stove on by accident?: Denies  Forget how to use items around the house?: denies  Getting lost going to familiar places?: denies  Forgetting loved ones names?: no issues  Word finding difficulty? Losing train of thoughts  Sleep: will take a nap daily, 6 to 7 hours per night    OTHER MEDICAL CONDITIONS: Fibromyalgia, Chronic pain, Anxiety/Depression    REVIEW OF SYSTEMS: Full 14 system review of systems performed and negative with exception of: As noted in the HPI   ALLERGIES: Allergies  Allergen Reactions   Codeine Itching and Nausea And Vomiting    HOME MEDICATIONS: Outpatient Medications Prior to Visit  Medication Sig Dispense Refill   acetaminophen (TYLENOL) 325 MG tablet Take 2 tablets (650 mg total) by mouth every 6 (six) hours as needed. 36 tablet 0   albuterol (VENTOLIN HFA) 108 (90 Base) MCG/ACT inhaler Inhale 1-2 puffs into the lungs every 6 (six) hours as needed for wheezing or shortness of breath.     ibuprofen (ADVIL) 200 MG tablet Take 800 mg by mouth every 4 (four) hours as needed for moderate pain.     magnesium chloride (SLOW-MAG) 64 MG TBEC SR tablet Take 1 tablet  by mouth daily.     pregabalin (LYRICA) 25 MG capsule TAKE 1 CAPSULE BY MOUTH THREE TIMES DAILY 90 capsule 0   Semaglutide, 1 MG/DOSE, 4 MG/3ML SOPN Inject 1 mg as directed once a week. 3 mL 2   sertraline (ZOLOFT) 100 MG tablet TAKE 1 & 1/2 (ONE & ONE-HALF) TABLETS BY MOUTH ONCE DAILY 30 tablet 0   metoprolol tartrate (LOPRESSOR) 100 MG tablet Take one tablet by mouth 2 hours prior to CT Scan (Patient not taking: Reported on 10/06/2022) 1 tablet 0   rosuvastatin (CRESTOR) 20 MG tablet Take 1 tablet (20 mg total) by mouth daily. (Patient not  taking: Reported on 10/27/2022) 90 tablet 3   vitamin B-12 (CYANOCOBALAMIN) 100 MCG tablet Take 100 mcg by mouth daily. (Patient not taking: Reported on 12/08/2022)     No facility-administered medications prior to visit.    PAST MEDICAL HISTORY: Past Medical History:  Diagnosis Date   Allergy    seasonal   Anemia    Anxiety    Asthma    CAP (community acquired pneumonia) 10/2018   Chest pain    Clotting disorder (HCC)    COPD (chronic obstructive pulmonary disease) (HCC)    Cough 10/2018   Dizziness    Dyspnea    Fibromyalgia    Hyperlipidemia 10/2018   Hypertension    Hypokalemia    Insomnia 10/2018   Microcytic anemia    Pre-diabetes    Skin lesions 10/2019   Thrombocytosis    Vitamin D deficiency 10/2018    PAST SURGICAL HISTORY: Past Surgical History:  Procedure Laterality Date   APPENDECTOMY  05/02/1989   CHOLECYSTECTOMY  05/02/2001   COLONOSCOPY WITH PROPOFOL N/A 05/18/2021   Procedure: COLONOSCOPY WITH PROPOFOL;  Surgeon: Wyline Mood, MD;  Location: [facility] ENDOSCOPY;  Service: Gastroenterology;  Laterality: N/A;   LUMBAR LAMINECTOMY/DECOMPRESSION MICRODISCECTOMY Left 02/16/2022   Procedure: Left - Lumbar Four-Lumbar Five laminectomy and foraminotomy, Left - Lumbar Five-Sacral One microdiskectomy;  Surgeon: Donalee Citrin, MD;  Location: [facility] OR;  Service: Neurosurgery;  Laterality: Left;  3C   LUMBAR LAMINECTOMY/DECOMPRESSION MICRODISCECTOMY N/A 02/18/2022   Procedure: Lumbar Five - Sacral One re-exploration of diskectomy;  Surgeon: Donalee Citrin, MD;  Location: [facility] OR;  Service: Neurosurgery;  Laterality: N/A;   TRIGGER FINGER RELEASE Right    TUBAL LIGATION  05/02/2001    FAMILY HISTORY: Family History  Problem Relation Age of Onset   Alcohol abuse Mother    COPD Mother    Hypertension Mother    Stroke Mother    Alcohol abuse Father    Diabetes Father    Drug abuse Father    Heart disease Father    Hypertension Father    Kidney disease Father    Congestive  Heart Failure Father    Heart attack Father    Stroke Father    Rheum arthritis Sister    Ankylosing spondylitis Sister    Arthritis Maternal Grandmother    Coronary artery disease Maternal Grandmother    Arthritis Maternal Grandfather    Melanoma Maternal Grandfather    Cancer Paternal Grandmother    Diabetes Paternal Grandmother    Heart disease Paternal Grandmother    Heart attack Paternal Grandmother    Diabetes Paternal Grandfather    Heart disease Paternal Grandfather    Kidney disease Paternal Grandfather    Stroke Paternal Grandfather    Hypertension Daughter    Obesity Daughter     SOCIAL HISTORY: Social History   Socioeconomic History  Marital status: Single    Spouse name: Not on file   Number of children: Not on file   Years of education: Not on file   Highest education level: Not on file  Occupational History   Not on file  Tobacco Use   Smoking status: Every Day    Current packs/day: 0.50    Average packs/day: 0.5 packs/day for 25.0 years (12.5 ttl pk-yrs)    Types: Cigarettes    Passive exposure: Never   Smokeless tobacco: Current   Tobacco comments:    3/4 pack a day  Vaping Use   Vaping status: Former  Substance and Sexual Activity   Alcohol use: Not Currently    Comment: rare   Drug use: Not Currently    Types: Marijuana    Comment: daily   Sexual activity: Yes    Partners: Male    Birth control/protection: None  Other Topics Concern   Not on file  Social History Narrative   51 year old and 68 y/o daughters   45 year old granddaughter - takes care of her in the afternoon after school   Significant other - 15 years   Work: Environmental health practitioner   Social Determinants of Corporate investment banker Strain: Not on file  Food Insecurity: Low Risk  (12/05/2022)   Received from [facility]   Hunger Vital Sign    Worried About Running Out of Food in the Last Year: Never true    Ran Out of Food in the Last Year: Never true   Transportation Needs: No Transportation Needs (02/19/2022)   PRAPARE - Administrator, Civil Service (Medical): No    Lack of Transportation (Non-Medical): No  Physical Activity: Not on file  Stress: Not on file  Social Connections: Unknown (03/21/2022)   [facility]   Social Network    Social Network: Not on file  Intimate Partner Violence: Unknown (03/21/2022)   Received from [facility]   HITS    Physically Hurt: Not on file    Insult or Talk Down To: Not on file    Threaten Physical Harm: Not on file    Scream or Curse: Not on file    PHYSICAL EXAM   GENERAL EXAM/CONSTITUTIONAL: Vitals:  Vitals:   01/10/23 1341  BP: 128/88  Weight: 231 lb (104.8 kg)  Height: 5\' 5"  (1.651 m)   Body mass index is 38.44 kg/m. Wt Readings from Last 3 Encounters:  01/10/23 231 lb (104.8 kg)  10/06/22 250 lb 6.4 oz (113.6 kg)  07/18/22 249 lb (112.9 kg)   Patient is in no distress; well developed, nourished and groomed; neck is supple  MUSCULOSKELETAL: Gait, strength, tone, movements noted in Neurologic exam below  NEUROLOGIC: MENTAL STATUS:      No data to display            01/10/2023    1:43 PM  Montreal Cognitive Assessment   Visuospatial/ Executive (0/5) 3  Naming (0/3) 2  Attention: Read list of digits (0/2) 2  Attention: Read list of letters (0/1) 0  Attention: Serial 7 subtraction starting at 100 (0/3) 3  Language: Repeat phrase (0/2) 1  Language : Fluency (0/1) 0  Abstraction (0/2) 2  Delayed Recall (0/5) 1  Orientation (0/6) 6  Total 20    CRANIAL NERVE:  2nd, 3rd, 4th, 6th- visual fields full to confrontation, extraocular muscles intact, no nystagmus 5th - facial sensation symmetric 7th - facial strength symmetric 8th -  hearing intact 9th - palate elevates symmetrically, uvula midline 11th - shoulder shrug symmetric 12th - tongue protrusion midline  MOTOR:  normal bulk and tone, full strength in the BUE,  BLE  SENSORY:  normal and symmetric to light touch  COORDINATION:  finger-nose-finger, fine finger movements normal  GAIT/STATION:  normal   DIAGNOSTIC DATA (LABS, IMAGING, TESTING) - I reviewed patient records, labs, notes, testing and imaging myself where available.  Lab Results  Component Value Date   WBC 9.7 10/06/2022   HGB 14.8 10/06/2022   HCT 44.8 10/06/2022   MCV 85 10/06/2022   PLT 476 (H) 10/06/2022      Component Value Date/Time   NA 139 10/06/2022 1036   K 4.4 10/06/2022 1036   CL 98 10/06/2022 1036   CO2 24 10/06/2022 1036   GLUCOSE 124 (H) 10/06/2022 1036   GLUCOSE 115 (H) 06/01/2022 1426   BUN 9 10/06/2022 1036   CREATININE 0.74 10/06/2022 1036   CALCIUM 9.8 10/06/2022 1036   PROT 6.9 10/06/2022 1036   ALBUMIN 4.4 10/06/2022 1036   AST 20 10/06/2022 1036   ALT 20 10/06/2022 1036   ALKPHOS 92 10/06/2022 1036   BILITOT 0.6 10/06/2022 1036   GFRNONAA >60 06/01/2022 1426   GFRAA >60 10/15/2018 0615   Lab Results  Component Value Date   CHOL 187 10/06/2022   HDL 39 (L) 10/06/2022   LDLCALC 117 (H) 10/06/2022   TRIG 175 (H) 10/06/2022   CHOLHDL 4.8 (H) 10/06/2022   Lab Results  Component Value Date   HGBA1C 7.2 (H) 10/06/2022   Lab Results  Component Value Date   VITAMINB12 259 10/23/2018   Lab Results  Component Value Date   TSH 1.920 10/06/2022     ASSESSMENT AND PLAN  52 y.o. year old female with history of fibromyalgia, chronic pain, anxiety/depression who is presenting with memory loss described as being forgetful, repeating herself, not sure if she had conversation with her daughters or not.  On exam she scored 20 out of 30 on the MoCA indicative of impairment.  Plan for now is to rule out causes of her cognitive impairment, will obtain dementia lab including ATN and B12, her last TSH was normal.  Will also obtain a MRI brain.  I will send the patient for formal neuropsychological testing.  I will contact you to go over the result.   Discussed ways to reducing the risk of developing dementia including maintaining good sleep, good health, good diet, and exercise.  She voiced understanding.  I will see her in 1 year for follow-up.   1. Mild cognitive impairment      Patient Instructions  Dementia lab including ATN and B12 level MRI brain without contrast Continue your other medications Referral for formal neuropsychological testing Discussed ways to reduce the risk of developing dementia including increased exercise, good sleep, good diet and maintaining good health.  Follow-up in 1 year or sooner if worse.  There are well-accepted and sensible ways to reduce risk for Alzheimers disease and other degenerative brain disorders .  Exercise Daily Walk A daily 20 minute walk should be part of your routine. Disease related apathy can be a significant roadblock to exercise and the only way to overcome this is to make it a daily routine and perhaps have a reward at the end (something your loved one loves to eat or drink perhaps) or a personal trainer coming to the home can also be very useful. Most importantly, the patient is much  more likely to exercise if the caregiver / spouse does it with him/her. In general a structured, repetitive schedule is best.  General Health: Any diseases which effect your body will effect your brain such as a pneumonia, urinary infection, blood clot, heart attack or stroke. Keep contact with your primary care doctor for regular follow ups.  Sleep. A good nights sleep is healthy for the brain. Seven hours is recommended. If you have insomnia or poor sleep habits we can give you some instructions. If you have sleep apnea wear your mask.  Diet: Eating a heart healthy diet is also a good idea; fish and poultry instead of red meat, nuts (mostly non-peanuts), vegetables, fruits, olive oil or canola oil (instead of butter), minimal salt (use other spices to flavor foods), whole grain rice, bread, cereal and  pasta and wine in moderation.Research is now showing that the MIND diet, which is a combination of The Mediterranean diet and the DASH diet, is beneficial for cognitive processing and longevity. Information about this diet can be found in The MIND Diet, a book by Alonna Minium, MS, RDN, and online at [employer].es  Finances, Power of [address] and Advance Directives: You should consider putting legal safeguards in place with regard to financial and medical decision making. While the spouse always has power of attorney for medical and financial issues in the absence of any form, you should consider what you want in case the spouse / caregiver is no longer around or capable of making decisions.   Orders Placed This Encounter  Procedures   MR BRAIN WO CONTRAST   ATN PROFILE   Vitamin B12   Ambulatory referral to Neuropsychology    No orders of the defined types were placed in this encounter.   Return in about 1 year (around 01/10/2024).  I have spent a total of 60 minutes dedicated to this patient today, preparing to see patient, performing a medically appropriate examination and evaluation, ordering tests and/or medications and procedures, and counseling and educating the patient/family/caregiver; independently interpreting result and communicating results to the family/patient/caregiver; and documenting clinical information in the electronic medical record.   Windell Norfolk, MD 01/10/2023, 2:37 PM  [facility] [address] [place], [place] [address] [phone]

## 2023-01-10 NOTE — Patient Instructions (Signed)
Dementia lab including ATN and B12 level MRI brain without contrast Continue your other medications Referral for formal neuropsychological testing Discussed ways to reduce the risk of developing dementia including increased exercise, good sleep, good diet and maintaining good health.  Follow-up in 1 year or sooner if worse.  There are well-accepted and sensible ways to reduce risk for Alzheimers disease and other degenerative brain disorders .  Exercise Daily Walk A daily 20 minute walk should be part of your routine. Disease related apathy can be a significant roadblock to exercise and the only way to overcome this is to make it a daily routine and perhaps have a reward at the end (something your loved one loves to eat or drink perhaps) or a personal trainer coming to the home can also be very useful. Most importantly, the patient is much more likely to exercise if the caregiver / spouse does it with him/her. In general a structured, repetitive schedule is best.  General Health: Any diseases which effect your body will effect your brain such as a pneumonia, urinary infection, blood clot, heart attack or stroke. Keep contact with your primary care doctor for regular follow ups.  Sleep. A good nights sleep is healthy for the brain. Seven hours is recommended. If you have insomnia or poor sleep habits we can give you some instructions. If you have sleep apnea wear your mask.  Diet: Eating a heart healthy diet is also a good idea; fish and poultry instead of red meat, nuts (mostly non-peanuts), vegetables, fruits, olive oil or canola oil (instead of butter), minimal salt (use other spices to flavor foods), whole grain rice, bread, cereal and pasta and wine in moderation.Research is now showing that the MIND diet, which is a combination of The Mediterranean diet and the DASH diet, is beneficial for cognitive processing and longevity. Information about this diet can be found in The MIND Diet, a book by  Alonna Minium, MS, RDN, and online at [employer]  Finances, Power of [address] and Advance Directives: You should consider putting legal safeguards in place with regard to financial and medical decision making. While the spouse always has power of attorney for medical and financial issues in the absence of any form, you should consider what you want in case the spouse / caregiver is no longer around or capable of making decisions.

## 2023-01-11 ENCOUNTER — Encounter: Payer: Self-pay | Admitting: Pharmacist

## 2023-01-11 ENCOUNTER — Telehealth: Payer: Self-pay | Admitting: Gastroenterology

## 2023-01-11 NOTE — Telephone Encounter (Signed)
Hi Dr. Adela Lank,    Supervising Provider PM 01/11/2023   We received a referral for patient for fatty and enlarger liver. Patient was last seen with  Gastroenterology in 2023 and last had a colonoscopy. Patient is wishing to transfer her care due to being referred by her primary care provider and also due to living in [place]. Patient records are in [facility] for you to review and advise on scheduling.    Thank you.

## 2023-01-11 NOTE — Telephone Encounter (Signed)
We can see her in the office for new patient visit.  Nonurgent.  Thanks

## 2023-01-12 ENCOUNTER — Telehealth: Payer: Self-pay | Admitting: Neurology

## 2023-01-12 NOTE — Telephone Encounter (Signed)
Referral for neuropsychology fax to [facility]. Phone:[phone], Fax: [phone]

## 2023-01-13 LAB — ATN PROFILE
A -- Beta-amyloid 42/40 Ratio: 0.115 (ref >0.102)
Beta-amyloid 40: 228.32 pg/mL
Beta-amyloid 42: 26.26 pg/mL
N -- NfL, Plasma: 1.24 pg/mL (ref 0.00–3.78)
T -- p-tau181: 0.7 pg/mL (ref 0.00–0.95)

## 2023-01-13 LAB — VITAMIN B12: Vitamin B-12: 339 pg/mL (ref 232–1245)

## 2023-01-13 MED ORDER — VITAMIN B-12 1000 MCG PO TABS: 1000.0000 ug | ORAL_TABLET | Freq: Every day | ORAL | 1 refills | Status: DC

## 2023-01-13 NOTE — Addendum Note (Signed)
Addended byWindell Norfolk on: 01/13/2023 07:36 AM   Modules accepted: Orders

## 2023-01-16 ENCOUNTER — Ambulatory Visit (INDEPENDENT_AMBULATORY_CARE_PROVIDER_SITE_OTHER): Payer: 59 | Admitting: Nurse Practitioner

## 2023-01-16 ENCOUNTER — Encounter: Payer: Self-pay | Admitting: Gastroenterology

## 2023-01-16 VITALS — BP 135/84 | HR 79 | Temp 98.0°F | Ht 65.0 in | Wt 229.6 lb

## 2023-01-16 DIAGNOSIS — M2550 Pain in unspecified joint: Secondary | ICD-10-CM | POA: Diagnosis not present

## 2023-01-16 DIAGNOSIS — R0683 Snoring: Secondary | ICD-10-CM

## 2023-01-16 DIAGNOSIS — G4719 Other hypersomnia: Secondary | ICD-10-CM

## 2023-01-16 NOTE — Progress Notes (Signed)
Subjective   Patient ID: Erin Zamora, female    DOB: 1970/10/09, 52 y.o.   MRN: [id number]  Chief Complaint  Patient presents with   Medical Management of Chronic Issues    Referring provider: Ivonne Andrew, NP  Erin Zamora is a 52 y.o. female with Past Medical History: No date: Allergy     Comment:  seasonal No date: Anemia No date: Anxiety No date: Asthma 10/2018: CAP (community acquired pneumonia) No date: Chest pain No date: Clotting disorder (HCC) No date: COPD (chronic obstructive pulmonary disease) (HCC) 10/2018: Cough No date: Dizziness No date: Dyspnea No date: Fibromyalgia 10/2018: Hyperlipidemia No date: Hypertension No date: Hypokalemia 10/2018: Insomnia No date: Microcytic anemia No date: Pre-diabetes 10/2019: Skin lesions No date: Thrombocytosis 10/2018: Vitamin D deficiency   HPI  Patient presents today for follow-up visit.  At her last visit here she was complaining of memory loss.  She has followed with neurology and does have an upper coming MRI scheduled an appointment with neuropsychology.  Patient did test little cognitive testing score.  Patient has followed with ENT due to hoarse voice.  They could not find a reason as to why her voice has been hoarse lately.  Patient stated that she would like to be tested for lupus today.  She states that she has been having fatigue and multiple joint pain as well as memory loss.  Her labs today.  States she is excessively tired during the day and does snore at night.  We will refer her to pulmonary for sleep study. Denies f/c/s, n/v/d, hemoptysis, PND, leg swelling Denies chest pain or edema     Allergies  Allergen Reactions   Codeine Itching and Nausea And Vomiting     There is no immunization history on file for this patient.  Tobacco History: Social History   Tobacco Use  Smoking Status Every Day   Current packs/day: 0.50   Average packs/day: 0.5 packs/day for 25.0 years  (12.5 ttl pk-yrs)   Types: Cigarettes   Passive exposure: Never  Smokeless Tobacco Current  Tobacco Comments   3/4 pack a day   Ready to quit: Not Answered Counseling given: Not Answered Tobacco comments: 3/4 pack a day   Outpatient Encounter Medications as of 01/16/2023  Medication Sig   acetaminophen (TYLENOL) 325 MG tablet Take 2 tablets (650 mg total) by mouth every 6 (six) hours as needed.   albuterol (VENTOLIN HFA) 108 (90 Base) MCG/ACT inhaler Inhale 1-2 puffs into the lungs every 6 (six) hours as needed for wheezing or shortness of breath.   cyanocobalamin (VITAMIN B12) 1000 MCG tablet Take 1 tablet (1,000 mcg total) by mouth daily.   ibuprofen (ADVIL) 200 MG tablet Take 800 mg by mouth every 4 (four) hours as needed for moderate pain.   magnesium chloride (SLOW-MAG) 64 MG TBEC SR tablet Take 1 tablet by mouth daily.   pregabalin (LYRICA) 25 MG capsule TAKE 1 CAPSULE BY MOUTH THREE TIMES DAILY   Semaglutide, 1 MG/DOSE, 4 MG/3ML SOPN Inject 1 mg as directed once a week.   sertraline (ZOLOFT) 100 MG tablet TAKE 1 & 1/2 (ONE & ONE-HALF) TABLETS BY MOUTH ONCE DAILY   No facility-administered encounter medications on file as of 01/16/2023.    Review of Systems  Review of Systems  Constitutional: Negative.   HENT: Negative.    Cardiovascular: Negative.   Gastrointestinal: Negative.   Allergic/Immunologic: Negative.   Neurological: Negative.   Psychiatric/Behavioral: Negative.  Objective:   BP 135/84   Pulse 79   Temp 98 F (36.7 C) (Oral)   Ht 5\' 5"  (1.651 m)   Wt 229 lb 9.6 oz (104.1 kg)   LMP 10/30/2021   SpO2 98%   BMI 38.21 kg/m   Wt Readings from Last 5 Encounters:  01/16/23 229 lb 9.6 oz (104.1 kg)  01/10/23 231 lb (104.8 kg)  10/06/22 250 lb 6.4 oz (113.6 kg)  07/18/22 249 lb (112.9 kg)  06/29/22 245 lb 12.8 oz (111.5 kg)     Physical Exam Vitals and nursing note reviewed.  Constitutional:      General: She is not in acute distress.     Appearance: She is well-developed.  Cardiovascular:     Rate and Rhythm: Normal rate and regular rhythm.  Pulmonary:     Effort: Pulmonary effort is normal.     Breath sounds: Normal breath sounds.  Neurological:     Mental Status: She is alert and oriented to person, place, and time.       Assessment & Plan:   Multiple joint pain -     ANA -     C-reactive protein -     Sedimentation rate  Excessive daytime sleepiness -     Ambulatory referral to Pulmonology  Snores -     Ambulatory referral to Pulmonology     Return in about 6 months (around 07/16/2023).   Ivonne Andrew, NP 01/16/2023

## 2023-01-16 NOTE — Patient Instructions (Addendum)
1. Multiple joint pain  - ANA - C-reactive protein - Sedimentation Rate  2. Excessive daytime sleepiness  - Ambulatory referral to Pulmonology  3. Snores  - Ambulatory referral to Pulmonology   Follow up:  Follow up in 6 months

## 2023-01-16 NOTE — Progress Notes (Signed)
Patient states no concerns to speak of right now.

## 2023-01-17 LAB — C-REACTIVE PROTEIN: CRP: 4 mg/L (ref 0–10)

## 2023-01-17 LAB — SEDIMENTATION RATE: Sed Rate: 7 mm/h (ref 0–40)

## 2023-01-17 LAB — ANA: Anti Nuclear Antibody (ANA): NEGATIVE

## 2023-01-19 ENCOUNTER — Telehealth: Payer: Self-pay | Admitting: Neurology

## 2023-01-19 NOTE — Telephone Encounter (Signed)
[facility] NPR case #[id number] sent to [facility] [phone]

## 2023-01-25 ENCOUNTER — Other Ambulatory Visit: Payer: Self-pay | Admitting: Nurse Practitioner

## 2023-01-25 DIAGNOSIS — F32A Depression, unspecified: Secondary | ICD-10-CM

## 2023-01-26 ENCOUNTER — Other Ambulatory Visit: Payer: 59 | Admitting: Pharmacist

## 2023-01-31 NOTE — Telephone Encounter (Signed)
Pt said [facility] gave me a CD from my MRI. Did Dr. Teresa Coombs want me to drop off the CD to him so he can review it? Would like a call from the nurse.

## 2023-02-01 NOTE — Telephone Encounter (Signed)
Call from [place], LPN at [facility] relaying they are faxing a significant result to [place] about recent MRI. Placing order in MD office for review

## 2023-02-05 ENCOUNTER — Encounter: Payer: Self-pay | Admitting: Neurology

## 2023-02-05 ENCOUNTER — Other Ambulatory Visit: Payer: Self-pay | Admitting: Neurology

## 2023-02-05 DIAGNOSIS — J3489 Other specified disorders of nose and nasal sinuses: Secondary | ICD-10-CM

## 2023-02-05 NOTE — Progress Notes (Signed)
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  Further evaluation with a nonemergent CT face without contrast is recommended

## 2023-02-05 NOTE — Progress Notes (Signed)
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  Further evaluation with a nonemergent CT face without contrast is recommended.     Spoke with patient, discussed MRI results and she is comfortable with plans. (Face CT)

## 2023-02-08 ENCOUNTER — Telehealth: Payer: Self-pay | Admitting: Neurology

## 2023-02-08 NOTE — Telephone Encounter (Signed)
[facility] NPR Case Number:[id number] sent to GI [phone]

## 2023-02-16 ENCOUNTER — Other Ambulatory Visit (INDEPENDENT_AMBULATORY_CARE_PROVIDER_SITE_OTHER): Payer: 59 | Admitting: Pharmacist

## 2023-02-16 DIAGNOSIS — D649 Anemia, unspecified: Secondary | ICD-10-CM
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
## 2023-02-16 NOTE — Patient Instructions (Addendum)
Hi Erin Zamora,   Here is the website for the Ozempic patient assistance program.   https://www.novocare.com/diabetes/help-with-costs/pap.html  Let me know when you are going to be in town and we can schedule a lab visit to check your A1c and cholesterol.   Thanks!  Catie Eppie Gibson, PharmD, BCACP, CPP Clinical Pharmacist [facility] [phone]

## 2023-02-17 ENCOUNTER — Other Ambulatory Visit: Payer: Self-pay | Admitting: Physical Medicine & Rehabilitation

## 2023-03-04 ENCOUNTER — Other Ambulatory Visit: Payer: Self-pay | Admitting: Nurse Practitioner

## 2023-03-04 DIAGNOSIS — F32A Depression, unspecified: Secondary | ICD-10-CM

## 2023-03-06 NOTE — Telephone Encounter (Signed)
Please advise Kh

## 2023-03-07 ENCOUNTER — Other Ambulatory Visit: Payer: Self-pay | Admitting: Pharmacist

## 2023-03-07 ENCOUNTER — Encounter: Payer: Self-pay | Admitting: Pharmacist

## 2023-03-07 DIAGNOSIS — E119 Type 2 diabetes mellitus without complications: Secondary | ICD-10-CM

## 2023-03-07 NOTE — Progress Notes (Signed)
Care Coordination Call  Received call from patient. She is now unemployed/uninsured. Discussed [employer] patient assistance program and [place] Medicaid application. Information provided for both.   Catie Eppie Gibson, PharmD, BCACP, CPP Clinical Pharmacist [facility] [phone]

## 2023-03-14 ENCOUNTER — Encounter: Payer: Self-pay | Admitting: Oncology

## 2023-03-21 ENCOUNTER — Ambulatory Visit: Payer: 59 | Admitting: Gastroenterology

## 2023-04-04 ENCOUNTER — Other Ambulatory Visit: Payer: Self-pay | Admitting: Nurse Practitioner

## 2023-04-04 DIAGNOSIS — F32A Depression, unspecified: Secondary | ICD-10-CM

## 2023-04-18 ENCOUNTER — Other Ambulatory Visit: Payer: Self-pay

## 2023-05-01 ENCOUNTER — Other Ambulatory Visit: Payer: Self-pay

## 2023-05-01 NOTE — Progress Notes (Cosign Needed)
05/01/2023 Name: Erin Zamora MRN: [id number] DOB: May 04, 1970  Chief Complaint  Patient presents with   Diabetes   Hyperlipidemia         Erin Zamora is a 52 y.o. year old female who presented for a telephone visit. PMH includes T2DM, HTN, HLD, COPD, obesity, dysphagia (chronic throat clearer).    They were referred to the pharmacist by their PCP for assistance in managing diabetes and hyperlipidemia.   Subjective: She reports that she is doing ok. She has been unemployed/uninsured since Nov. Has been living in [facility] for several months taking care of her grandfather. She estimates that she has not had an Ozempic (semaglutide) injection in ~5 weeks. The only other prescription medication she is taking is sertraline (cash price). She estimates that she will return to work in February.   At last pharmacy contact, PharmD provided pt with online Ozempic PAP form - patient visited website but said she has not had time to enter all the information. Per PharmD - PCP was not pre-filling as a provider, so application may have to be initiated over the phone. Patient is also interested in exploring state insurance options - though unsure if she will qualify for [facility] Medicaid since she is temporarily living there.   She has not been closely following a healthy diet over the past few weeks, but is still conscious of carbohydrate intake. Noticed that she has gained 12 lbs since being off Ozempic.  Of note - history of dysphagia/difficulty swallowing liquids has been going on for ~1 yr. Did not notice any worsening since starting Ozempic. Barium swallow 2024  w/o esophageal abnormalities.   Pt has never checked her blood sugar at home at all. Pt reports that she tried metformin in the past (unsure whether XR or IR), and she does not want to retrial - felt dizzy, fatigued. Expresses concern that metformin will not be good for her liver and kidneys.   Care Team: Primary Care Provider:  Ivonne Andrew, NP ; Next Scheduled Visit: none scheduled  Medication Access/Adherence  Current Pharmacy:  [facility] [address] ([place]), [place] - [address] [place] ([place]) [place] [address] Phone: [phone] Fax: [phone]  [facility] [address] [place] - [address] [place] [address] Phone: [phone] Fax: [phone]   Patient reports affordability concerns with their medications: Yes  - uninsured. Cash price for sertraline is affordable.  Patient reports access/transportation concerns to their pharmacy: No  Patient reports adherence concerns with their medications:  No     Diabetes:  Current medications: none - reports she has one dose left of Ozempic 1 mg (estimates pen open for < 56 days)  Medications tried in the past: metformin (dizziness, fatigue) - discussed better tolerability of XR version and ability to start low-dose and patient is unwilling to retrial.   Patient is not checking BG at home. Does not have supplies. Is not interested at this time. Offered that if she is unable to obtain labwork while she does not have insurance she could consider purchasing [employer] Relion meter and test strips to monitor her BG at home.   Patient denies hypoglycemic s/sx including dizziness, shakiness, sweating. 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 Patient denies hyperglycemic  symptoms including polyuria, polydipsia, polyphagia, and nocturia.  Nutritional Patterns: - Breakfast: often skips; - Lunch: burgers without bread, fish, bread, green vegetable (green beans, peas, broccoli). Today went out for lunch > fried chicken livers, pintos, fried eggplant - Supper: yesterday seasoned tuna packet with  shredded cheese and keto wrap, lime zest tortilla chips. Yesterday vegetable noodle soup - avoided eating too many noodles.  - Dessert: has cut out sweets in the evening; cut out soda.    Physical activity; s/p back surgery in October, chronic pain. Raking leaves, pulling plants, cleaning house, walks to mailbox.    Hyperlipidemia/ASCVD Risk Reduction  Current lipid lowering medications: prescribed rosuvastation 20 mg daily but would prefers to focus on lifestyle prior to initiating pharmacotherapy.   Family History: stroke in mother, MI/stroke/DM in father Risk Factors: DM, tobacco (3/4 PPD)  PREVENT Risk Score: 10 year risk of CVD: 7.7% - 10 year risk of ASCVD: 4.8% - 10 year risk of HF: 5.3%  Clinical ASCVD: No  The 10-year ASCVD risk score (Arnett DK, et al., 2019) is: 5.7%   Values used to calculate the score:     Age: 60 years     Sex: Female     Is Non-Hispanic African American: No     Diabetic: No     Tobacco smoker: Yes     Systolic Blood Pressure: 135 mmHg     Is BP treated: No     HDL Cholesterol: 39 mg/dL     Total Cholesterol: 187 mg/dL    Objective:  Lab Results  Component Value Date   HGBA1C 7.2 (H) 10/06/2022    Lab Results  Component Value Date   CREATININE 0.74 10/06/2022   BUN 9 10/06/2022   NA 139 10/06/2022   K 4.4 10/06/2022   CL 98 10/06/2022   CO2 24 10/06/2022    Lab Results  Component Value Date   CHOL 187 10/06/2022   HDL 39 (L) 10/06/2022   LDLCALC 117 (H) 10/06/2022   TRIG 175 (H) 10/06/2022   CHOLHDL 4.8 (H) 10/06/2022    Medications Reviewed Today     Reviewed by Particia Lather, RPH (Pharmacist) on 05/01/23 at 1855  Med List Status: <None>   Medication Order Taking? Sig Documenting Provider Last Dose Status Informant  acetaminophen (TYLENOL) 325 MG tablet [id number]  Take 2 tablets (650 mg total) by mouth every 6 (six) hours as needed. Sloan Leiter, DO  Active Self           Med Note Alphonzo Dublin   Fri Feb 04, 2022   3:00 PM)    albuterol (VENTOLIN HFA) 108 (90 Base) MCG/ACT inhaler [id number]  Inhale 1-2 puffs into the lungs every 6 (six) hours as needed for wheezing or shortness of breath. [provider]  Active Self  cyanocobalamin (VITAMIN B12) 1000 MCG tablet [id number]  Take 1 tablet (1,000 mcg total) by mouth daily. Windell Norfolk, MD  Active   ibuprofen (ADVIL) 200 MG tablet [id number]  Take 800 mg by mouth every 4 (four) hours as needed for moderate pain. [provider]  Active Self           Med Note Particia Lather   Thu Dec 08, 2022  1:08 PM) Taking 800 mg BID  magnesium chloride (SLOW-MAG) 64 MG TBEC SR tablet [id number]  Take 1 tablet by mouth daily. [provider]  Active   pregabalin (LYRICA) 25 MG capsule [id number] No TAKE 1 CAPSULE BY MOUTH THREE  TIMES DAILY  Patient not taking: Reported on 05/01/2023   Fanny Dance, MD Not Taking Active            Med Note Dellie Burns May 01, 2023  6:54 PM) Out since Sept 2/2 no insurance  Semaglutide, 1 MG/DOSE, 4 MG/3ML SOPN [id number] No Inject 1 mg as directed once a week.  Patient not taking: Reported on 05/01/2023   Ivonne Andrew, NP Not Taking Active            Med Note Dellie Burns May 01, 2023  6:55 PM) Out since mid-Oct 2/2 no insurance  sertraline (ZOLOFT) 100 MG tablet [id number] Yes TAKE 1 & 1/2 (ONE & ONE-HALF) TABLETS BY MOUTH ONCE DAILY Ivonne Andrew, NP Taking Active              Assessment/Plan:   Diabetes: - Currently uncontrolled per last A1c of 7.2% above goal <7%, likely better controlled while she was taking Ozempic (semaglutide) - however access is now an issue. Reports she has 1 dose of Ozempic 1 mg strength remaining in her fridge. Pt is unwilling to retrial metformin. Will attempt to obtain Ozempic via PAP, though also encouraged patient to investigate Medicaid insurance options. Pt is a good candidate for GLP-1 RA given metabolic syndrome with BMI > 35. Due for monitoring -  though limited by pt location and lack of insurance. - Reviewed long term cardiovascular and renal outcomes of uncontrolled blood sugar - Reviewed goal A1c, goal fasting, and goal 2 hour post prandial glucose - Reviewed dietary modifications including controlling portion size of carbohydrates, high fat foods - Reviewed lifestyle modifications including: increasing water intake, physical activity as able - Recommend to use up remaining Ozempic (semaglutide) supply by injecting 20 clicks (~0.25 mg) subcutaneous weekly for 4 weeks while PAP is in progress.  - Recommended obtaining OTC BG monitoring supplies ([employer] Relion) and checking FBG and ocassional 2 hr PPG if she continues to have symptoms of hyperglycemia.  - Encouraged pt to make appointment to see PCP Angus Seller when she is back in [place] - overdue for A1c.  - Will collaborate with Medication Access team to pursue Ozempic PAP via [employer].    Hyperlipidemia/ASCVD Risk Reduction: - Currently uncontrolled with LDL-C of 117 mg/dL above goal <78 mg/dL, currently untreated. Patient prefers to manage with lifestyle at this time. Will recommend repeat lipid panel to reevaluate need for initiation of statin therapy at f/u.  - Reviewed long term complications of uncontrolled cholesterol - Recommend to repeat lipid panel when possible  Follow Up Plan: Pharmacist telephone 05/29/22  Nils Pyle, PharmD PGY1 Pharmacy Resident

## 2023-05-02 ENCOUNTER — Other Ambulatory Visit: Payer: Self-pay

## 2023-05-16 ENCOUNTER — Other Ambulatory Visit: Payer: Self-pay | Admitting: Nurse Practitioner

## 2023-05-16 ENCOUNTER — Telehealth: Payer: Self-pay

## 2023-05-16 DIAGNOSIS — F32A Depression, unspecified: Secondary | ICD-10-CM

## 2023-05-16 NOTE — Progress Notes (Signed)
 Contacted pt via telephone to follow-up on MyChart message concerning options for GLP-1 patient assistance. LVM and instructions for call back.   Nils Pyle, PharmD PGY1 Pharmacy Resident

## 2023-05-25 ENCOUNTER — Other Ambulatory Visit: Payer: Self-pay

## 2023-05-30 ENCOUNTER — Other Ambulatory Visit: Payer: Self-pay

## 2023-05-30 DIAGNOSIS — E1165 Type 2 diabetes mellitus with hyperglycemia: Secondary | ICD-10-CM

## 2023-05-30 NOTE — Progress Notes (Unsigned)
05/30/2023 Name: Erin Zamora MRN: [id number] DOB: 05/22/1970  Chief Complaint  Patient presents with   Diabetes   Medication Access    Erin Zamora is a 53 y.o. year old female who presented for a telephone visit. PMH includes T2DM, HTN, HLD, COPD, obesity, dysphagia (chronic throat clearer).    They were referred to the pharmacist by their PCP for assistance in managing diabetes and hyperlipidemia.   Subjective:  Reports she is doing alright. She looked into [email or web address], which was too expensive, and [place], but she was not eligible She is still in [place] with her grandfather, but she is about to start a job and she will be eligible for insurance after 90 days. She still is unsure whether she will be back in [place]. 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   At last pharmacist appointment, we discussed options for getting her restarted on a GLP-1RA - however these assistance programs will only be useful to her if she is able to get the medications in [place] or if a friend/family member could ship the medication to her. She reported that this was not feasible. She has not been able to purchase SMBG supplies from [place]. She reports that her diet has not improved since our last phone call. It is difficult for her to eat healthy while living with her grandfather.  Of note - history of dysphagia/difficulty swallowing liquids has been going on for ~1 yr. Did not notice any worsening since starting Ozempic. Barium swallow 2024  w/o esophageal abnormalities. Also, she is unwilling to retrial metformin. Unsure if she took XR or IR previously. It made her feel dizzy and fatigue. Expresses concern that metformin will not be good for her liver and kidneys.   Care Team: Primary Care Provider: Ivonne Andrew, NP ; Next Scheduled Visit: 07/17/23 (unsure whether she will be back in [facility])  Medication Access/Adherence  Current Pharmacy:   [id number] - [place] ([place]), [place] - [address]  ([place]) [place] [address] Phone: [phone] Fax: [phone]  [facility] [address], [place] - [address] [place] [address] Phone: [phone] Fax: [phone]   Patient reports affordability concerns with their medications: Yes  - uninsured. Cash price for sertraline is affordable.  Patient reports access/transportation concerns to their pharmacy: No  Patient reports adherence concerns with their medications:  No     Diabetes:  Current medications: none   Medications tried in the past: metformin (dizziness, fatigue) - discussed better tolerability of XR version and ability to start low-dose and patient is unwilling to retrial, Ozempic (unaffordable)  Patient is not checking BG at home. Does not have supplies. Is not interested at this time. Offered that if she is unable to obtain labwork while she does not have insurance she could consider purchasing [facility] Relion meter and test strips to monitor her BG at home.   Patient denies hypoglycemic s/sx including dizziness, shakiness, sweating. 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 Patient denies hyperglycemic symptoms including polyuria, polydipsia, polyphagia, and nocturia.  Nutritional Patterns: - Breakfast: often skips; - Lunch: burgers without bread, fish, bread, green vegetable (green beans, peas, broccoli). Today went out for  lunch > fried chicken livers, pintos, fried eggplant - Supper: yesterday seasoned tuna packet with shredded cheese and keto wrap, lime zest tortilla chips. Yesterday vegetable noodle soup - avoided eating too many noodles.  - Dessert: has cut out sweets in the evening; cut  out soda.   Feels that she increased carbohydrate intake recently. Reports that she has gained ~15 lbs.    Physical activity; s/p back surgery in October, chronic pain. Raking leaves, pulling plants, cleaning house, walks to mailbox.    Hyperlipidemia/ASCVD Risk Reduction  Current lipid lowering medications: none - previously prescribed rosuvastation 20 mg daily but did not fill due to wanting to focus on lifestyle interventions first.   Family History: stroke in mother, MI/stroke/DM in father Risk Factors: DM, tobacco (3/4 PPD)  PREVENT Risk Score: 10 year risk of CVD: 7.7% - 10 year risk of ASCVD: 4.8% - 10 year risk of HF: 5.3%  Clinical ASCVD: No  The 10-year ASCVD risk score (Arnett DK, et al., 2019) is: 5.7%   Values used to calculate the score:     Age: 7 years     Sex: Female     Is Non-Hispanic African American: No     Diabetic: No     Tobacco smoker: Yes     Systolic Blood Pressure: 135 mmHg     Is BP treated: No     HDL Cholesterol: 39 mg/dL     Total Cholesterol: 187 mg/dL    Objective:  Lab Results  Component Value Date   HGBA1C 7.2 (H) 10/06/2022    Lab Results  Component Value Date   CREATININE 0.74 10/06/2022   BUN 9 10/06/2022   NA 139 10/06/2022   K 4.4 10/06/2022   CL 98 10/06/2022   CO2 24 10/06/2022    Lab Results  Component Value Date   CHOL 187 10/06/2022   HDL 39 (L) 10/06/2022   LDLCALC 117 (H) 10/06/2022   TRIG 175 (H) 10/06/2022   CHOLHDL 4.8 (H) 10/06/2022    Medications Reviewed Today     Reviewed by Particia Lather, RPH (Pharmacist) on 05/30/23 at 1959  Med List Status: <None>   Medication Order Taking? Sig Documenting Provider Last Dose Status Informant  acetaminophen (TYLENOL) 325 MG tablet [id number]  Take 2 tablets (650 mg total) by mouth every 6 (six) hours as needed. Sloan Leiter, DO  Active Self           Med Note Alphonzo Dublin   Fri Feb 04, 2022  3:00 PM)    albuterol (VENTOLIN HFA) 108 (90 Base) MCG/ACT inhaler  [id number]  Inhale 1-2 puffs into the lungs every 6 (six) hours as needed for wheezing or shortness of breath. [provider]  Active Self  cyanocobalamin (VITAMIN B12) 1000 MCG tablet [id number]  Take 1 tablet (1,000 mcg total) by mouth daily. Windell Norfolk, MD  Active   ibuprofen (ADVIL) 200 MG tablet [id number]  Take 800 mg by mouth every 4 (four) hours as needed for moderate pain. [provider]  Active Self           Med Note Particia Lather   Thu Dec 08, 2022  1:08 PM) Taking 800 mg BID  magnesium chloride (SLOW-MAG) 64 MG TBEC SR tablet [id number]  Take 1 tablet by mouth daily. [provider]  Active   pregabalin (LYRICA) 25 MG capsule [id number] No TAKE 1 CAPSULE BY MOUTH THREE TIMES DAILY  Patient not taking: Reported on 05/30/2023  Fanny Dance, MD Not Taking Active            Med Note Dellie Burns May 01, 2023  6:54 PM) Out since Sept 2/2 no insurance  Patient not taking:  Discontinued 05/30/23 1959 (Cost of medication)            Med Note Particia Lather   Mon May 01, 2023  6:55 PM) Out since mid-Oct 2/2 no insurance  sertraline (ZOLOFT) 100 MG tablet [id number] Yes TAKE 1 & 1/2 (ONE & ONE-HALF) TABLETS BY MOUTH ONCE DAILY Ivonne Andrew, NP Taking Active              Assessment/Plan:   Diabetes: - Currently uncontrolled per last A1c of 7.2% above goal <7%, though she is well past due for a repeat A1c, She does not have s/sx consistent with overt hyperglycemia. We have exhausted avenues to obtain her a GLP-1RA while she is out of work and uninsured. Will plan to pursue SGLT2i PAP, as this could be mailed to her current address if approved. Once patient is insured, will pursue coverage of GLP-1RA given metabolic syndrome and BMI > 35. Emphasized importance of lifestyle interventions today. Unfortunately, she is unwilling to retrial metformin at this time, which would likely get her BG to goal and be affordable.  - Reviewed long term  cardiovascular and renal outcomes of uncontrolled blood sugar - Reviewed goal A1c, goal fasting, and goal 2 hour post prandial glucose - Reviewed dietary modifications including controlling portion size of carbohydrates, high fat foods - Reviewed lifestyle modifications including: increasing water intake, physical activity as able - Recommended obtaining OTC BG monitoring supplies ([facility] Relion) and checking FBG and ocassional 2 hr PPG  - Overdue for A1c - Will collaborate with Medication Access team to pursue Farxiga PAP via [employer]. If approved, start Farxiga 10 mg once daily. Will follow-up to counsel on risk of VVC, hydration, and sick day rules.   Hyperlipidemia/ASCVD Risk Reduction: - Currently uncontrolled with LDL-C of 117 mg/dL above goal <40 mg/dL, currently untreated. Patient prefers to manage with lifestyle at this time. Will recommend repeat lipid panel to reevaluate need for initiation of statin therapy at f/u.  - Reviewed long term complications of uncontrolled cholesterol - Recommend to repeat lipid panel when possible - Will plan to address tobacco cessation at follow-up  Follow Up Plan: Pharmacist telephone 05/29/22  Nils Pyle, PharmD PGY1 Pharmacy Resident

## 2023-05-31 ENCOUNTER — Other Ambulatory Visit: Payer: Self-pay

## 2023-05-31 MED ORDER — DAPAGLIFLOZIN PROPANEDIOL 10 MG PO TABS
10.0000 mg | ORAL_TABLET | Freq: Every day | ORAL | 3 refills | Status: DC
Start: 2023-05-31 — End: 2023-12-05

## 2023-06-01 ENCOUNTER — Telehealth: Payer: Self-pay

## 2023-06-01 NOTE — Progress Notes (Signed)
Contacted patient to discuss plan to initiate PAP for Farxiga (dapagliflozin) to treat her T2DM while she does not have insurance. Counsled on medication administration and side effects including risk of VVC, needing to stay hydrated with water, and sick day rules. PAP has been initiated by CPhT Mardee Postin. Will follow-up on status of application and reach out as needed prior to scheduled follow-up. Patient agreeable to plan.   Nils Pyle, PharmD PGY1 Pharmacy Resident

## 2023-06-09 ENCOUNTER — Other Ambulatory Visit: Payer: Self-pay

## 2023-06-09 ENCOUNTER — Telehealth: Payer: Self-pay

## 2023-06-09 NOTE — Telephone Encounter (Signed)
 Received notification from [employer] regarding approval for FARXIGA . Patient assistance approved from 06/09/2023 to 06/08/2024.  Medication will ship to [address], [place] [address]  Pt ID: [id number]  Company phone: [phone]

## 2023-06-09 NOTE — Telephone Encounter (Signed)
 Submitted application for FARXIGA to [employer] for patient assistance.   Phone: [phone]

## 2023-06-17 ENCOUNTER — Other Ambulatory Visit: Payer: Self-pay | Admitting: Nurse Practitioner

## 2023-06-17 DIAGNOSIS — F32A Depression, unspecified: Secondary | ICD-10-CM

## 2023-06-19 ENCOUNTER — Telehealth: Payer: Self-pay

## 2023-06-19 ENCOUNTER — Other Ambulatory Visit: Payer: Self-pay | Admitting: Nurse Practitioner

## 2023-06-19 NOTE — Telephone Encounter (Signed)
Caller & Relationship to patient:  MRN #  [id number]   Call Back Number:   Date of Last Office Visit: 06/19/2023     Date of Next Office Visit: 07/17/2023    Medication(s) to be Refilled: Zoloft, Patient is completely out needing a refill sent today. Thanks.  Preferred Pharmacy: [facility]- [address]  ** Please notify patient to allow 48-72 hours to process** **Let patient know to contact pharmacy at the end of the day to make sure medication is ready. ** **If patient has not been seen in a year or longer, book an appointment **Advise to use MyChart for refill requests OR to contact their pharmacy

## 2023-06-19 NOTE — Telephone Encounter (Signed)
Copied from CRM [id number]. Topic: Clinical - Medication Refill >> Jun 19, 2023  9:17 AM Antwanette L wrote: Most Recent Primary Care Visit:  Provider: Nilda Simmer T  Department: [facility]  Visit Type: PATIENT OUTREACH 30  Date: 02/16/2023  Medication: sertraline (ZOLOFT) 100 MG tablet  Has the patient contacted their pharmacy? Yes. The pharmacy contacted the patient and told her they have not received an order from her provider.   Is this the correct pharmacy for this prescription? Yes If no, delete pharmacy and type the correct one.  This is the patient's preferred pharmacy:   [facility] [address] [place] - [address] [place] [address] Phone: [phone] Fax: [phone]    Has the prescription been filled recently? No  Is the patient out of the medication? Yes  Has the patient been seen for an appointment in the last year OR does the patient have an upcoming appointment? Yes  Can we respond through MyChart? Yes and by phone at [phone]  Agent: Please be advised that Rx refills may take up to 3 business days. We ask that you follow-up with your pharmacy.

## 2023-06-20 ENCOUNTER — Other Ambulatory Visit: Payer: Self-pay | Admitting: Nurse Practitioner

## 2023-07-05 ENCOUNTER — Other Ambulatory Visit: Payer: Self-pay | Admitting: Nurse Practitioner

## 2023-07-05 DIAGNOSIS — F32A Depression, unspecified: Secondary | ICD-10-CM

## 2023-07-06 NOTE — Telephone Encounter (Signed)
 Please advise [facility]

## 2023-07-17 ENCOUNTER — Ambulatory Visit: Payer: Self-pay | Admitting: Nurse Practitioner

## 2023-07-25 ENCOUNTER — Other Ambulatory Visit: Payer: Self-pay

## 2023-07-25 NOTE — Progress Notes (Signed)
Attempted to contact patient for scheduled appointment for medication management. Left HIPAA compliant message for patient to return my call at their convenience.    Nils Pyle, PharmD PGY1 Pharmacy Resident

## 2023-07-26 ENCOUNTER — Other Ambulatory Visit: Payer: Self-pay | Admitting: Nurse Practitioner

## 2023-07-26 DIAGNOSIS — F32A Depression, unspecified: Secondary | ICD-10-CM

## 2023-08-03 ENCOUNTER — Other Ambulatory Visit: Payer: Self-pay

## 2023-08-11 ENCOUNTER — Other Ambulatory Visit: Payer: Self-pay

## 2023-08-14 ENCOUNTER — Other Ambulatory Visit: Payer: Self-pay | Admitting: Nurse Practitioner

## 2023-08-14 DIAGNOSIS — F32A Depression, unspecified: Secondary | ICD-10-CM

## 2023-09-19 ENCOUNTER — Encounter: Payer: Self-pay | Admitting: Oncology

## 2023-09-20 ENCOUNTER — Encounter: Payer: Self-pay | Admitting: Oncology

## 2023-09-21 ENCOUNTER — Ambulatory Visit (INDEPENDENT_AMBULATORY_CARE_PROVIDER_SITE_OTHER): Admitting: Nurse Practitioner

## 2023-09-21 ENCOUNTER — Encounter: Payer: Self-pay | Admitting: Nurse Practitioner

## 2023-09-21 VITALS — BP 144/79 | HR 79 | Temp 98.1°F | Wt 242.6 lb

## 2023-09-21 DIAGNOSIS — Z13220 Encounter for screening for lipoid disorders: Secondary | ICD-10-CM

## 2023-09-21 DIAGNOSIS — N644 Mastodynia: Secondary | ICD-10-CM

## 2023-09-21 DIAGNOSIS — E119 Type 2 diabetes mellitus without complications: Secondary | ICD-10-CM | POA: Diagnosis not present

## 2023-09-21 DIAGNOSIS — F32A Depression, unspecified: Secondary | ICD-10-CM

## 2023-09-21 DIAGNOSIS — F3342 Major depressive disorder, recurrent, in full remission: Secondary | ICD-10-CM

## 2023-09-21 DIAGNOSIS — Z1329 Encounter for screening for other suspected endocrine disorder: Secondary | ICD-10-CM

## 2023-09-21 MED ORDER — BUPROPION HCL ER (SR) 100 MG PO TB12: 100.0000 mg | ORAL_TABLET | Freq: Two times a day (BID) | ORAL | 2 refills | Status: DC

## 2023-09-21 MED ORDER — SEMAGLUTIDE(0.25 OR 0.5MG/DOS) 2 MG/3ML ~~LOC~~ SOPN
0.2500 mg | PEN_INJECTOR | SUBCUTANEOUS | 2 refills | Status: DC
Start: 2023-09-21 — End: 2023-12-05

## 2023-09-21 MED ORDER — SERTRALINE HCL 100 MG PO TABS: 100.0000 mg | ORAL_TABLET | Freq: Every day | ORAL | 3 refills | Status: AC

## 2023-09-21 NOTE — Progress Notes (Signed)
 Subjective   Patient ID: Erin Zamora, female    DOB: [birth year], 53 y.o.   MRN: [id number]  Chief Complaint  Patient presents with   Medical Management of Chronic Issues    Referring provider: Jerrlyn Morel, NP  Erin Zamora is a 53 y.o. female with Past Medical History: No date: Allergy     Comment:  seasonal No date: Anemia No date: Anxiety No date: Asthma 10/2018: CAP (community acquired pneumonia) No date: Chest pain No date: Clotting disorder (HCC) No date: COPD (chronic obstructive pulmonary disease) (HCC) 10/2018: Cough No date: Dizziness No date: Dyspnea No date: Fibromyalgia 05/2021: Hx of adenomatous colonic polyps     Comment:  Dr. Antony Baumgartner - Colonoscopy 10/2018: Hyperlipidemia No date: Hypertension No date: Hypokalemia 10/2018: Insomnia No date: Microcytic anemia No date: Pre-diabetes 10/2019: Skin lesions No date: Thrombocytosis 10/2018: Vitamin D  deficiency   HPI  Patient presents today for a follow-up visit.  She does have a history of diabetes.  A1c in office today is 6.7.  Patient would like to start back on Ozempic  for diabetes and weight loss.  Patient has been going through a rough time recently.  Her grandmother and her husband both passed away within the past few months.  She does need referral to psychiatry.  Patient states she has currently very depressed but is not suicidal.  She is currently on Zoloft .  We will add Wellbutrin. Denies f/c/s, n/v/d, hemoptysis, PND, leg swelling Denies chest pain or edema      Allergies  Allergen Reactions   Codeine Itching and Nausea And Vomiting     There is no immunization history on file for this patient.  Tobacco History: Social History   Tobacco Use  Smoking Status Every Day   Current packs/day: 0.50   Average packs/day: 0.5 packs/day for 25.0 years (12.5 ttl pk-yrs)   Types: Cigarettes   Passive exposure: Never  Smokeless Tobacco Current  Tobacco Comments   3/4 pack a  day   Ready to quit: No Counseling given: Yes Tobacco comments: 3/4 pack a day   Outpatient Encounter Medications as of 09/21/2023  Medication Sig   acetaminophen  (TYLENOL ) 325 MG tablet Take 2 tablets (650 mg total) by mouth every 6 (six) hours as needed.   albuterol  (VENTOLIN  HFA) 108 (90 Base) MCG/ACT inhaler Inhale 1-2 puffs into the lungs every 6 (six) hours as needed for wheezing or shortness of breath.   buPROPion ER (WELLBUTRIN SR) 100 MG 12 hr tablet Take 1 tablet (100 mg total) by mouth 2 (two) times daily.   dapagliflozin  propanediol (FARXIGA ) 10 MG TABS tablet Take 1 tablet (10 mg total) by mouth daily before breakfast.   ibuprofen  (ADVIL ) 200 MG tablet Take 800 mg by mouth every 4 (four) hours as needed for moderate pain.   Semaglutide ,0.25 or 0.5MG /DOS, 2 MG/3ML SOPN Inject 0.25 mg into the skin once a week.   [DISCONTINUED] sertraline  (ZOLOFT ) 100 MG tablet TAKE 1 & 1/2 (ONE & ONE-HALF) TABLETS BY MOUTH ONCE DAILY   cyanocobalamin (VITAMIN B12) 1000 MCG tablet Take 1 tablet (1,000 mcg total) by mouth daily. (Patient not taking: Reported on 09/21/2023)   magnesium  chloride (SLOW-MAG) 64 MG TBEC SR tablet Take 1 tablet by mouth daily. (Patient not taking: Reported on 09/21/2023)   pregabalin  (LYRICA ) 25 MG capsule TAKE 1 CAPSULE BY MOUTH THREE TIMES DAILY (Patient not taking: Reported on 05/01/2023)   sertraline  (ZOLOFT ) 100 MG tablet Take 1 tablet (100 mg total) by mouth  daily.   No facility-administered encounter medications on file as of 09/21/2023.    Review of Systems  Review of Systems  Constitutional: Negative.   HENT: Negative.    Cardiovascular: Negative.   Gastrointestinal: Negative.   Allergic/Immunologic: Negative.   Neurological: Negative.   Psychiatric/Behavioral: Negative.       Objective:   BP (!) 144/79   Pulse 79   Temp 98.1 F (36.7 C) (Oral)   Wt 242 lb 9.6 oz (110 kg)   LMP 10/30/2021   SpO2 95%   BMI 40.37 kg/m   Wt Readings from Last 5  Encounters:  09/21/23 242 lb 9.6 oz (110 kg)  01/16/23 229 lb 9.6 oz (104.1 kg)  01/10/23 231 lb (104.8 kg)  10/06/22 250 lb 6.4 oz (113.6 kg)  07/18/22 249 lb (112.9 kg)     Physical Exam Vitals and nursing note reviewed.  Constitutional:      General: She is not in acute distress.    Appearance: She is well-developed.  Cardiovascular:     Rate and Rhythm: Normal rate and regular rhythm.  Pulmonary:     Effort: Pulmonary effort is normal.     Breath sounds: Normal breath sounds.  Neurological:     Mental Status: She is alert and oriented to person, place, and time.       Assessment & Plan:   Recurrent major depressive disorder, in full remission (HCC) -     Ambulatory referral to Psychiatry -     buPROPion HCl ER (SR); Take 1 tablet (100 mg total) by mouth 2 (two) times daily.  Dispense: 30 tablet; Refill: 2  Depression, unspecified depression type -     Sertraline  HCl; Take 1 tablet (100 mg total) by mouth daily.  Dispense: 45 tablet; Refill: 3 -     buPROPion HCl ER (SR); Take 1 tablet (100 mg total) by mouth 2 (two) times daily.  Dispense: 30 tablet; Refill: 2  Breast pain, right -     MM 3D DIAGNOSTIC MAMMOGRAM BILATERAL BREAST  Type 2 diabetes mellitus without complication, without long-term current use of insulin (HCC) -     Semaglutide (0.25 or 0.5MG /DOS); Inject 0.25 mg into the skin once a week.  Dispense: 3 mL; Refill: 2 -     AMB Referral [facility]BCI Care Management -     CBC -     Comprehensive metabolic panel with GFR  Thyroid disorder screen -     TSH  Lipid screening -     Lipid panel     Return in about 6 months (around 03/23/2024).   Jerrlyn Morel, NP 09/21/2023

## 2023-09-21 NOTE — Patient Instructions (Signed)
 1. Recurrent major depressive disorder, in full remission (HCC) (Primary)  - Ambulatory referral to Psychiatry - buPROPion ER (WELLBUTRIN SR) 100 MG 12 hr tablet; Take 1 tablet (100 mg total) by mouth 2 (two) times daily.  Dispense: 30 tablet; Refill: 2  2. Depression, unspecified depression type  - sertraline  (ZOLOFT ) 100 MG tablet; Take 1 tablet (100 mg total) by mouth daily.  Dispense: 45 tablet; Refill: 3 - buPROPion ER (WELLBUTRIN SR) 100 MG 12 hr tablet; Take 1 tablet (100 mg total) by mouth 2 (two) times daily.  Dispense: 30 tablet; Refill: 2  3. Breast pain, right  - MM 3D DIAGNOSTIC MAMMOGRAM BILATERAL BREAST  4. Type 2 diabetes mellitus without complication, without long-term current use of insulin (HCC)  - Semaglutide ,0.25 or 0.5MG /DOS, 2 MG/3ML SOPN; Inject 0.25 mg into the skin once a week.  Dispense: 3 mL; Refill: 2 - AMB Referral VBCI Care Management - CBC - Comprehensive metabolic panel with GFR  5. Thyroid disorder screen  - TSH  6. Lipid screening  - Lipid Panel

## 2023-09-22 ENCOUNTER — Ambulatory Visit: Payer: Self-pay | Admitting: Nurse Practitioner

## 2023-09-22 LAB — COMPREHENSIVE METABOLIC PANEL WITH GFR
ALT: 20 IU/L (ref 0–32)
AST: 14 IU/L (ref 0–40)
Albumin: 4.1 g/dL (ref 3.8–4.9)
Alkaline Phosphatase: 85 IU/L (ref 44–121)
BUN/Creatinine Ratio: 13 (ref 9–23)
BUN: 10 mg/dL (ref 6–24)
Bilirubin Total: 0.3 mg/dL (ref 0.0–1.2)
CO2: 24 mmol/L (ref 20–29)
Calcium: 9.9 mg/dL (ref 8.7–10.2)
Chloride: 99 mmol/L (ref 96–106)
Creatinine, Ser: 0.77 mg/dL (ref 0.57–1.00)
Globulin, Total: 2.6 g/dL (ref 1.5–4.5)
Glucose: 89 mg/dL (ref 70–99)
Potassium: 4.8 mmol/L (ref 3.5–5.2)
Sodium: 141 mmol/L (ref 134–144)
Total Protein: 6.7 g/dL (ref 6.0–8.5)
eGFR: 93 mL/min/{1.73_m2} (ref >59)

## 2023-09-22 LAB — CBC
Hematocrit: 46.2 % (ref 34.0–46.6)
Hemoglobin: 15.3 g/dL (ref 11.1–15.9)
MCH: 29.7 pg (ref 26.6–33.0)
MCHC: 33.1 g/dL (ref 31.5–35.7)
MCV: 90 fL (ref 79–97)
Platelets: 528 10*3/uL — ABNORMAL HIGH (ref 150–450)
RBC: 5.15 x10E6/uL (ref 3.77–5.28)
RDW: 13.1 % (ref 11.7–15.4)
WBC: 12.5 10*3/uL — ABNORMAL HIGH (ref 3.4–10.8)

## 2023-09-22 LAB — LIPID PANEL
Chol/HDL Ratio: 3.9 ratio (ref 0.0–4.4)
Cholesterol, Total: 189 mg/dL (ref 100–199)
HDL: 49 mg/dL (ref >39)
LDL Chol Calc (NIH): 120 mg/dL — ABNORMAL HIGH (ref 0–99)
Triglycerides: 109 mg/dL (ref 0–149)
VLDL Cholesterol Cal: 20 mg/dL (ref 5–40)

## 2023-09-22 LAB — TSH: TSH: 1.9 u[IU]/mL (ref 0.450–4.500)

## 2023-09-29 ENCOUNTER — Other Ambulatory Visit: Payer: Self-pay | Admitting: Nurse Practitioner

## 2023-09-29 DIAGNOSIS — N644 Mastodynia: Secondary | ICD-10-CM

## 2023-10-02 ENCOUNTER — Telehealth: Payer: Self-pay | Admitting: *Deleted

## 2023-10-02 NOTE — Progress Notes (Signed)
 Care Guide Pharmacy Note  10/02/2023 Name: Erin Zamora MRN: [id number] DOB: Jun 22, 1970  Referred By: Jerrlyn Morel, NP Reason for referral: Complex Care Management (Initial outreach to schedule referral with PharmD )   Erin Zamora is a 53 y.o. year old female who is a primary care patient of Jerrlyn Morel, NP.  Erin Zamora was referred to the pharmacist for assistance related to: DMII  Successful contact was made with the patient to discuss pharmacy services including being ready for the pharmacist to call at least 5 minutes before the scheduled appointment time and to have medication bottles and any blood pressure readings ready for review. The patient agreed to meet with the pharmacist via telephone visit on (date/time). 11/14/23 1:30 PM   [facility]  [facility], [facility] Guide  Direct Dial: [phone]  Fax [phone]

## 2023-10-10 ENCOUNTER — Encounter: Payer: Self-pay | Admitting: Oncology

## 2023-10-10 ENCOUNTER — Other Ambulatory Visit (HOSPITAL_COMMUNITY): Payer: Self-pay

## 2023-10-25 ENCOUNTER — Other Ambulatory Visit

## 2023-10-25 ENCOUNTER — Encounter

## 2023-11-14 ENCOUNTER — Other Ambulatory Visit: Payer: Self-pay

## 2023-11-14 ENCOUNTER — Telehealth: Payer: Self-pay

## 2023-11-14 NOTE — Telephone Encounter (Signed)
 Attempted to contact patient for scheduled appointment x2 for medication management. Left HIPAA compliant message for patient to return my call at their convenience.   Lorain Baseman, PharmD [facility] [phone]

## 2023-11-14 NOTE — Progress Notes (Deleted)
 11/14/2023 Name: ALEKSIS JIGGETTS MRN: [id number] DOB: [birth year]  No chief complaint on file.   NASHEA CHUMNEY is a 53 y.o. year old female who presented for a telephone visit. PMH includes T2DM, HTN, HLD, COPD, obesity, dysphagia (chronic throat clearer).    They were referred to the pharmacist by their PCP for assistance in managing diabetes and hyperlipidemia.   Subjective: Patient was last seen by PCP, Bascom Borer, NP on 09/21/23. At this visit, her BP was elevated to 144/79 mmHg. Her A1C had decreased from 7.2% to 6.7%. She requested to restart Ozempic  for weight loss. She admitted to having a tought time recently with both her grandmother and husband passing over the past few months. She was initiated on bupropion , in addition to her sertraline , for depression.   Today, ***  Still taking Farxiga ? Statin Smoking Test claim for Ozempic  $25/mo through new insurance - test claim ran 10/10/23  Of note - history of dysphagia/difficulty swallowing liquids has been going on for ~1 yr. Did not notice any worsening since starting Ozempic . Barium swallow 2024  w/o esophageal abnormalities. Also, she is unwilling to retrial metformin. Unsure if she took XR or IR previously. It made her feel dizzy and fatigue. Expresses concern that metformin will not be good for her liver and kidneys.   Care Team: Primary Care Provider: Borer Bascom RAMAN, NP ; Next Scheduled Visit: not yet scheduled  Medication Access/Adherence  Current Pharmacy:  [facility] [address], [place] - [address] [place] [address] Phone: [phone] Fax: [phone]  [facility] [address] ([place]), [place] - [address]  ([place]) [place] [address] Phone: [phone] Fax: [phone]  [facility] - [place], [place] - [address] [place] [address] Phone: [phone] Fax: [phone]   Patient reports  affordability concerns with their medications: Yes  - uninsured. Cash price for sertraline  is affordable.  Patient reports access/transportation concerns to their pharmacy: No  Patient reports adherence concerns with their medications:  No     Diabetes:  Current medications: none   Medications tried in the past: metformin (dizziness, fatigue) - discussed better tolerability of XR version and ability to start low-dose and patient is unwilling to retrial, Ozempic  (unaffordable)  Patient is not checking BG at home. Does not have supplies. Is not interested at this time. Offered that if she is unable to obtain labwork while she does not have insurance she could consider purchasing [employer] meter and test strips to monitor her BG at home.   Patient denies hypoglycemic s/sx including dizziness, shakiness, sweating. 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 Patient denies hyperglycemic symptoms including polyuria, polydipsia, polyphagia, and nocturia.  Nutritional Patterns: - Breakfast: often skips; - Lunch: burgers without bread, fish, bread, green vegetable (green beans, peas, broccoli). Today went out for lunch > fried chicken livers, pintos, fried eggplant - Supper: yesterday seasoned tuna packet with shredded cheese and keto wrap, lime zest tortilla chips. Yesterday vegetable noodle soup - avoided eating too many noodles.  - Dessert: has cut out sweets in the evening; cut out soda.   Feels that she increased carbohydrate intake recently. Reports that she has gained ~15 lbs.  Physical activity; s/p back surgery in October, chronic pain. Raking leaves, pulling plants, cleaning house, walks to mailbox.    Hyperlipidemia/ASCVD Risk Reduction  Current lipid lowering medications: none -  previously prescribed rosuvastation 20 mg daily but did not fill due to wanting to focus on lifestyle interventions first.   Family History: stroke in mother, MI/stroke/DM in father Risk Factors: DM, tobacco (3/4 PPD)  PREVENT Risk Score: 10 year risk of CVD: 7.7% - 10 year risk of ASCVD: 4.8% - 10 year risk of HF: 5.3%  Clinical ASCVD: No  The 10-year ASCVD risk score (Arnett DK, et al., 2019) is: 5%   Values used to calculate the score:     Age: 7 years     Clincally relevant sex: Female     Is Non-Hispanic African American: No     Diabetic: No     Tobacco smoker: Yes     Systolic Blood Pressure: 144 mmHg     Is BP treated: No     HDL Cholesterol: 49 mg/dL     Total Cholesterol: 189 mg/dL    Objective:  Lab Results  Component Value Date   HGBA1C 7.2 (H) 10/06/2022   A1C (not recorded in chart) 09/21/23: 6.7%  Lab Results  Component Value Date   CREATININE 0.77 09/21/2023   BUN 10 09/21/2023   NA 141 09/21/2023   K 4.8 09/21/2023   CL 99 09/21/2023   CO2 24 09/21/2023    Lab Results  Component Value Date   CHOL 189 09/21/2023   HDL 49 09/21/2023   LDLCALC 120 (H) 09/21/2023   TRIG 109 09/21/2023   CHOLHDL 3.9 09/21/2023    Medications Reviewed Today   Medications were not reviewed in this encounter      Assessment/Plan:   Diabetes: - Currently uncontrolled per last A1c of 7.2% above goal <7%, though she is well past due for a repeat A1c, She does not have s/sx consistent with overt hyperglycemia. We have exhausted avenues to obtain her a GLP-1RA while she is out of work and uninsured. Will plan to pursue SGLT2i PAP, as this could be mailed to her current address if approved. Once patient is insured, will pursue coverage of GLP-1RA given metabolic syndrome and BMI > 35. Emphasized importance of lifestyle interventions today. Unfortunately, she is unwilling to retrial metformin at this time, which would likely get her BG to goal and be affordable.  -  Reviewed long term cardiovascular and renal outcomes of uncontrolled blood sugar - Reviewed goal A1c, goal fasting, and goal 2 hour post prandial glucose - Reviewed dietary modifications including controlling portion size of carbohydrates, high fat foods - Reviewed lifestyle modifications including: increasing water  intake, physical activity as able - Recommended obtaining OTC BG monitoring supplies ([employer] Rel[employer]) and checking FBG and ocassional 2 hr PPG  - Overdue for A1c - Will collaborate with Medication Access team to pursue Farxiga  PAP via [employer]. If approved, start Farxiga  10 mg once daily. Will follow-up to counsel on risk of VVC, hydration, and sick day rules.   Hyperlipidemia/ASCVD Risk Reduction: - Currently uncontrolled with LDL-C of 117 mg/dL above goal <29 mg/dL, currently untreated. Patient prefers to manage with lifestyle at this time. Will recommend repeat lipid panel to reevaluate need for initiation of statin therapy at f/u.  - Reviewed long term complications of uncontrolled cholesterol - Recommend to repeat lipid panel when possible - Will plan to address tobacco cessation at follow-up  Follow Up Plan: Pharmacist telephone 05/29/22  Lorain Baseman, PharmD PGY1 Pharmacy Resident

## 2023-11-27 ENCOUNTER — Emergency Department (HOSPITAL_COMMUNITY)

## 2023-11-27 ENCOUNTER — Emergency Department (HOSPITAL_COMMUNITY)
Admission: EM | Admit: 2023-11-27 | Discharge: 2023-11-27 | Disposition: A | Discharge status: Home / Self Care | Source: Ambulatory Visit | Attending: Emergency Medicine | Admitting: Emergency Medicine

## 2023-11-27 ENCOUNTER — Other Ambulatory Visit: Payer: Self-pay

## 2023-11-27 ENCOUNTER — Encounter (HOSPITAL_COMMUNITY): Payer: Self-pay | Admitting: Emergency Medicine

## 2023-11-27 DIAGNOSIS — R1010 Upper abdominal pain, unspecified: Secondary | ICD-10-CM

## 2023-11-27 DIAGNOSIS — R0781 Pleurodynia: Secondary | ICD-10-CM | POA: Diagnosis present

## 2023-11-27 DIAGNOSIS — R1011 Right upper quadrant pain: Secondary | ICD-10-CM | POA: Insufficient documentation

## 2023-11-27 LAB — URINALYSIS, ROUTINE W REFLEX MICROSCOPIC
Bacteria, UA: NONE SEEN
Bilirubin Urine: NEGATIVE
Glucose, UA: NEGATIVE mg/dL
Ketones, ur: NEGATIVE mg/dL
Leukocytes,Ua: NEGATIVE
Nitrite: NEGATIVE
Protein, ur: NEGATIVE mg/dL
Specific Gravity, Urine: 1.018 (ref 1.005–1.030)
pH: 6 (ref 5.0–8.0)

## 2023-11-27 LAB — BASIC METABOLIC PANEL WITH GFR
Anion gap: 10 (ref 5–15)
BUN: 10 mg/dL (ref 6–20)
CO2: 26 mmol/L (ref 22–32)
Calcium: 9.1 mg/dL (ref 8.9–10.3)
Chloride: 100 mmol/L (ref 98–111)
Creatinine, Ser: 0.65 mg/dL (ref 0.44–1.00)
GFR, Estimated: >60 mL/min (ref >60)
Glucose, Bld: 100 mg/dL — ABNORMAL HIGH (ref 70–99)
Potassium: 3.7 mmol/L (ref 3.5–5.1)
Sodium: 136 mmol/L (ref 135–145)

## 2023-11-27 LAB — HEPATIC FUNCTION PANEL
ALT: 23 U/L (ref 0–44)
AST: 19 U/L (ref 15–41)
Albumin: 4 g/dL (ref 3.5–5.0)
Alkaline Phosphatase: 75 U/L (ref 38–126)
Bilirubin, Direct: 0.1 mg/dL (ref 0.0–0.2)
Indirect Bilirubin: 0.6 mg/dL (ref 0.3–0.9)
Total Bilirubin: 0.7 mg/dL (ref 0.0–1.2)
Total Protein: 7.2 g/dL (ref 6.5–8.1)

## 2023-11-27 LAB — CBC
HCT: 45.3 % (ref 36.0–46.0)
Hemoglobin: 15.3 g/dL — ABNORMAL HIGH (ref 12.0–15.0)
MCH: 30.2 pg (ref 26.0–34.0)
MCHC: 33.8 g/dL (ref 30.0–36.0)
MCV: 89.5 fL (ref 80.0–100.0)
Platelets: 480 K/uL — ABNORMAL HIGH (ref 150–400)
RBC: 5.06 MIL/uL (ref 3.87–5.11)
RDW: 13.6 % (ref 11.5–15.5)
WBC: 9.8 K/uL (ref 4.0–10.5)
nRBC: 0 % (ref 0.0–0.2)

## 2023-11-27 LAB — LIPASE, BLOOD: Lipase: 34 U/L (ref 11–51)

## 2023-11-27 LAB — D-DIMER, QUANTITATIVE: D-Dimer, Quant: 0.49 ug{FEU}/mL (ref 0.00–0.50)

## 2023-11-27 MED ORDER — IOHEXOL 300 MG/ML  SOLN
100.0000 mL | Freq: Once | INTRAMUSCULAR | Status: AC | PRN
  Administered 2023-11-27: 100 mL via INTRAVENOUS

## 2023-11-27 NOTE — ED Triage Notes (Signed)
 Since Thursday evening pain to RT rib area in the front and back that is worse with deep breathing.  Seen by telehealth and told to come get checked for PE.  Does report she has flown a few times in the last couple of weeks. Denies any injury.

## 2023-11-27 NOTE — Discharge Instructions (Signed)
 Take Tylenol  or Motrin  for pain.  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  You should follow-up with them in the next 3 to 4 weeks

## 2023-11-27 NOTE — ED Notes (Signed)
 Patient transported to CT

## 2023-11-27 NOTE — ED Provider Notes (Signed)
 [place] [facility] DEPARTMENT AT [facility] Provider Note   CSN: [id number] Arrival date & time: 11/27/23  1214     Patient presents with: No chief complaint on file.   Erin Zamora is a 53 y.o. female.  {Add pertinent medical, surgical, social history, OB history to YEP:67052} Patient complains of some right-sided pleuritic chest pain along with right upper quadrant pain   Chest Pain      Prior to Admission medications   Medication Sig Start Date End Date Taking? Authorizing Provider  acetaminophen  (TYLENOL ) 325 MG tablet Take 2 tablets (650 mg total) by mouth every 6 (six) hours as needed. 01/04/21   Elnor Jayson LABOR, DO  albuterol  (VENTOLIN  HFA) 108 (90 Base) MCG/ACT inhaler Inhale 1-2 puffs into the lungs every 6 (six) hours as needed for wheezing or shortness of breath.    [provider]  buPROPion  ER (WELLBUTRIN  SR) 100 MG 12 hr tablet Take 1 tablet (100 mg total) by mouth 2 (two) times daily. 09/21/23   Oley Bascom RAMAN, NP  cyanocobalamin (VITAMIN B12) 1000 MCG tablet Take 1 tablet (1,000 mcg total) by mouth daily. Patient not taking: Reported on 09/21/2023 01/13/23   Camara, Amadou, MD  dapagliflozin  propanediol (FARXIGA ) 10 MG TABS tablet Take 1 tablet (10 mg total) by mouth daily before breakfast. 05/31/23   Nichols, Tonya S, NP  ibuprofen  (ADVIL ) 200 MG tablet Take 800 mg by mouth every 4 (four) hours as needed for moderate pain.    [provider]  magnesium  chloride (SLOW-MAG) 64 MG TBEC SR tablet Take 1 tablet by mouth daily. Patient not taking: Reported on 09/21/2023    [provider]  pregabalin  (LYRICA ) 25 MG capsule TAKE 1 CAPSULE BY MOUTH THREE TIMES DAILY Patient not taking: Reported on 05/01/2023 02/17/23   Urbano Albright, MD  Semaglutide ,0.25 or 0.5MG /DOS, 2 MG/3ML SOPN Inject 0.25 mg into the skin once a week. 09/21/23   Oley Bascom RAMAN, NP  sertraline  (ZOLOFT ) 100 MG tablet Take 1 tablet (100 mg total) by mouth  daily. 09/21/23   Oley Bascom RAMAN, NP    Allergies: Codeine    Review of Systems  Cardiovascular:  Positive for chest pain.    Updated Vital Signs BP 124/87   Pulse 71   Temp 98.5 F (36.9 C) (Oral)   Resp (!) 27   Ht 5' 5 (1.651 m)   Wt 110 kg   LMP 10/30/2021   SpO2 93%   BMI 40.36 kg/m   Physical Exam  (all labs ordered are listed, but only abnormal results are displayed) Labs Reviewed  BASIC METABOLIC PANEL WITH GFR - Abnormal; Notable for the following components:      Result Value   Glucose, Bld 100 (*)    All other components within normal limits  CBC - Abnormal; Notable for the following components:   Hemoglobin 15.3 (*)    Platelets 480 (*)    All other components within normal limits  URINALYSIS, ROUTINE W REFLEX MICROSCOPIC - Abnormal; Notable for the following components:   Hgb urine dipstick SMALL (*)    All other components within normal limits  D-DIMER, QUANTITATIVE  HEPATIC FUNCTION PANEL  LIPASE, BLOOD    EKG: EKG Interpretation Date/Time:  Monday November 27 2023 16:40:03 EDT Ventricular Rate:  56 PR Interval:  161 QRS Duration:  87 QT Interval:  398 QTC Calculation: 385 R Axis:   -7  Text Interpretation: Sinus rhythm Confirmed by Suzette Pac [phone]) on 11/27/2023 5:54:47  PM  Radiology: CT ABDOMEN PELVIS W CONTRAST Result Date: 11/27/2023 CLINICAL DATA:  Acute, non localized abdominal pain. EXAM: CT ABDOMEN AND PELVIS WITH CONTRAST TECHNIQUE: Multidetector CT imaging of the abdomen and pelvis was performed using the standard protocol following bolus administration of intravenous contrast. RADIATION DOSE REDUCTION: This exam was performed according to the departmental dose-optimization program which includes automated exposure control, adjustment of the mA and/or kV according to patient size and/or use of iterative reconstruction technique. CONTRAST:  OMNIPAQUE  IOHEXOL  300 MG/ML  SOLN COMPARISON:  02/18/2012 FINDINGS: Lower chest: Normal  sized heart. Mild bibasilar linear atelectasis or scarring. Hepatobiliary: Diffuse low-density of the liver. Cholecystectomy clips. Pancreas: Unremarkable. No pancreatic ductal dilatation or surrounding inflammatory changes. Spleen: Normal in size without focal abnormality. Adrenals/Urinary Tract: Normal-appearing adrenal glands. Small simple appearing right renal cysts. These do not need imaging follow-up. Unremarkable left kidney, ureters and urinary bladder. Stomach/Bowel: 1.2 cm oval, circumscribed, fat density mass in the posterior wall of the mid stomach. Surgically absent appendix. Mild descending and sigmoid colon diverticulosis without evidence of diverticulitis. Unremarkable small bowel. Vascular/Lymphatic: Atheromatous arterial calcifications without aneurysm. No enlarged lymph nodes. Reproductive: Uterus and bilateral adnexa are unremarkable. Other: Small left inguinal hernia containing fat. Musculoskeletal: Lumbar and lower thoracic spine degenerative changes. IMPRESSION: 1. No acute abnormality. 2. Diffuse hepatic steatosis. 3. Mild descending and sigmoid colon diverticulosis without evidence of diverticulitis. 4. 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 5. Small left inguinal hernia containing fat. Electronically Signed   By: Elspeth Bathe M.D.   On: 11/27/2023 16:12   DG Chest 2 View Result Date: 11/27/2023 CLINICAL DATA:  Right rib pain, worse with inspiration EXAM: CHEST - 2 VIEW COMPARISON:  06/01/2022 FINDINGS: Frontal and lateral views of the chest demonstrate an unremarkable cardiac silhouette. No acute airspace disease, effusion, or pneumothorax. No acute bony abnormalities. IMPRESSION: 1. No acute intrathoracic process. Electronically Signed   By: Ozell Daring M.D.   On: 11/27/2023 13:36    {Document cardiac monitor, telemetry assessment procedure when appropriate:32947} Procedures   Medications Ordered in the ED  iohexol  (OMNIPAQUE ) 300 MG/ML solution 100 mL (100  mLs Intravenous Contrast Given 11/27/23 1513)      {Click here for ABCD2, HEART and other calculators REFRESH Note before signing:1}                              Medical Decision Making Amount and/or Complexity of Data Reviewed Labs: ordered. Radiology: ordered. ECG/medicine tests: ordered.  Risk Prescription drug management.   ***  {Document critical care time when appropriate  Document review of labs and clinical decision tools ie CHADS2VASC2, etc  Document your independent review of radiology images and any outside records  Document your discussion with family members, caretakers and with consultants  Document social determinants of health affecting pt's care  Document your decision making why or why not admission, treatments were needed:32947:::1}   Final diagnoses:  Pleuritic chest pain  Pain of upper abdomen    ED Discharge Orders     None

## 2023-11-28 ENCOUNTER — Telehealth: Payer: Self-pay

## 2023-11-28 NOTE — Transitions of Care (Post Inpatient/ED Visit) (Signed)
   11/28/2023  Name: Erin Zamora MRN: [id number] DOB: 08-16-1970  Today's TOC FU Call Status: Today's TOC FU Call Status:: Unsuccessful Call (1st Attempt) Unsuccessful Call (1st Attempt) Date: 11/28/23  Attempted to reach the patient regarding the most recent Inpatient/ED visit.  Follow Up Plan: Additional outreach attempts will be made to reach the patient to complete the Transitions of Care (Post Inpatient/ED visit) call.   Signature  [employer], [place]

## 2023-12-01 ENCOUNTER — Telehealth: Payer: Self-pay | Admitting: *Deleted

## 2023-12-01 NOTE — Progress Notes (Signed)
 Complex Care Management Care Guide Note  12/01/2023 Name: Erin Zamora MRN: [id number] DOB: 1970-12-21  Erin Zamora is a 53 y.o. year old female who is a primary care patient of Oley Bascom RAMAN, NP and is actively engaged with the care management team. I reached out to Erin Zamora by phone today to assist with scheduling  with the Pharmacist.  Follow up plan: Telephone appointment with complex care management team member scheduled for:  12/05/23  Erin Zamora  [facility], [facility] Guide  Direct Dial: [phone]  Fax [phone]

## 2023-12-05 ENCOUNTER — Other Ambulatory Visit (INDEPENDENT_AMBULATORY_CARE_PROVIDER_SITE_OTHER): Payer: Self-pay

## 2023-12-05 ENCOUNTER — Telehealth: Payer: Self-pay

## 2023-12-05 ENCOUNTER — Encounter: Payer: Self-pay | Admitting: Internal Medicine

## 2023-12-05 DIAGNOSIS — M797 Fibromyalgia: Secondary | ICD-10-CM

## 2023-12-05 DIAGNOSIS — E119 Type 2 diabetes mellitus without complications: Secondary | ICD-10-CM

## 2023-12-05 MED ORDER — SEMAGLUTIDE(0.25 OR 0.5MG/DOS) 2 MG/3ML ~~LOC~~ SOPN
0.5000 mg | PEN_INJECTOR | SUBCUTANEOUS | 2 refills | Status: DC
Start: 2023-12-05 — End: 2024-03-06

## 2023-12-05 NOTE — Progress Notes (Unsigned)
 12/05/2023 Name: Erin Zamora MRN: [id number] DOB: 01/17/1971  Chief Complaint  Patient presents with   Diabetes    Erin Zamora is a 53 y.o. year old female who presented for a telephone visit. PMH includes T2DM, HTN, HLD, COPD, obesity, dysphagia (chronic throat clearer).    They were referred to the pharmacist by their PCP for assistance in managing diabetes and hyperlipidemia.   Subjective: Patient was last seen by PCP, Bascom Borer, NP on 09/21/23. At this visit, her BP was elevated to 144/79 mmHg. Her A1C had decreased from 7.2% to 6.7%. She requested to restart Ozempic  for weight loss. She admitted to having a tought time recently with both her grandmother and husband passing over the past few months. She was initiated on bupropion , in addition to her sertraline , for depression.   Today, patient reports doing ok. She never took Farxiga  (though she was approved for PAP and received the shipment). She does not feel ready to quit smoking given recent life events. She feels her anxiety is slowly getting better. She was able to take her first dose of Ozempic  this past Sunday.   Care Team: Primary Care Provider: Borer Bascom RAMAN, NP ; 12/08/23  Medication Access/Adherence  Current Pharmacy:  [facility] [address] ([place]), [place] - [address] [place] ([place]) [place] [address] Phone: [phone] Fax: [phone]  [facility] - [place], [place] - [address] [place] [address] Phone: [phone] Fax: [phone]   Patient reports affordability concerns with their medications: No   Patient reports access/transportation concerns to their pharmacy: No  Patient reports adherence concerns with their medications:  No     Diabetes:  Current medications: Ozempic  0.25 mg weekly  Medications tried in the past: metformin (dizziness, fatigue) - discussed better tolerability of XR version and ability to start  low-dose and patient is unwilling to retrial  Patient is not checking BG at home. Does not have supplies. Is not interested at this time.   Patient denies hypoglycemic s/sx including dizziness, shakiness, sweating. 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   Patient denies hyperglycemic symptoms including polyuria, polydipsia, polyphagia, and nocturia.  Nutritional Patterns (did not update today): - Breakfast: often skips; - Lunch: burgers without bread, fish, bread, green vegetable (green beans, peas, broccoli). Today went out for lunch > fried chicken livers, pintos, fried eggplant - Supper: yesterday seasoned tuna packet with shredded cheese and keto wrap, lime zest tortilla chips. Yesterday vegetable noodle soup - avoided eating too many noodles.  - Dessert: has cut out sweets in the evening; cut out soda.    Physical activity; s/p back surgery in October, chronic pain. Raking leaves, pulling plants, cleaning house, walks to mailbox.    Hyperlipidemia/ASCVD Risk Reduction  Current lipid lowering medications: none - previously prescribed rosuvastation 20 mg daily but did not fill due to wanting to focus on lifestyle interventions first.   Family History: stroke in mother, MI/stroke/DM in father Risk Factors: DM, tobacco (3/4 PPD)  PREVENT Risk Score: 10 year risk of CVD: 7.7% - 10 year risk of ASCVD: 4.8% - 10 year risk of HF: 5.3%  Clinical ASCVD: No  The 10-year ASCVD risk score (Arnett DK, et al.,  2019) is: 3.8%   Values used to calculate the score:     Age: 64 years     Clincally relevant sex: Female     Is Non-Hispanic African American: No     Diabetic: No     Tobacco smoker: Yes     Systolic Blood Pressure: 124 mmHg     Is BP treated: No     HDL Cholesterol: 49 mg/dL     Total  Cholesterol: 189 mg/dL    Objective:  Lab Results  Component Value Date   HGBA1C 7.2 (H) 10/06/2022   A1C (not recorded in chart) 09/21/23: 6.7%  Lab Results  Component Value Date   CREATININE 0.65 11/27/2023   BUN 10 11/27/2023   NA 136 11/27/2023   K 3.7 11/27/2023   CL 100 11/27/2023   CO2 26 11/27/2023    Lab Results  Component Value Date   CHOL 189 09/21/2023   HDL 49 09/21/2023   LDLCALC 120 (H) 09/21/2023   TRIG 109 09/21/2023   CHOLHDL 3.9 09/21/2023    Medications Reviewed Today     Reviewed by Erin Zamora, RPH (Pharmacist) on 12/05/23 at 1722  Med List Status: <None>   Medication Order Taking? Sig Documenting Provider Last Dose Status Informant  acetaminophen  (TYLENOL ) 325 MG tablet [id number]  Take 2 tablets (650 mg total) by mouth every 6 (six) hours as needed. Erin Zamora LABOR, DO  Active Self           Med Note SOILA Erin Zamora   Fri Feb 04, 2022  3:00 PM)    albuterol  (VENTOLIN  HFA) 108 (90 Base) MCG/ACT inhaler [id number]  Inhale 1-2 puffs into the lungs every 6 (six) hours as needed for wheezing or shortness of breath. [provider]  Active Self  buPROPion  ER (WELLBUTRIN  SR) 100 MG 12 hr tablet [id number]  Take 1 tablet (100 mg total) by mouth 2 (two) times daily.  Patient not taking: Reported on 12/05/2023   Erin Bascom RAMAN, NP  Active    Patient not taking:   Discontinued 12/05/23 1541 (Patient Preference)     Discontinued 12/05/23 1541 (Change in therapy)   ibuprofen  (ADVIL ) 200 MG tablet [id number]  Take 800 mg by mouth every 4 (four) hours as needed for moderate pain. [provider]  Active Self           Med Note DELSA LORAIN Zamora   Thu Dec 08, 2022  1:08 PM) Taking 800 mg BID   Patient not taking:   Discontinued 12/05/23 1541 (Patient Preference)   pregabalin  (LYRICA ) 25 MG capsule [id number]  TAKE 1 CAPSULE BY MOUTH THREE TIMES DAILY  Patient not taking: Reported on 12/05/2023   Urbano Albright, MD  Active            Med Note DELSA LORAIN Zamora Pablo May 01, 2023  6:54 PM) Out since Sept 2/2 no insurance  Semaglutide ,0.25 or 0.5MG /DOS, 2 MG/3ML NELMA [id number] Yes Inject 0.25 mg into the skin once a week. Erin Bascom RAMAN, NP  Active   sertraline  (ZOLOFT ) 100 MG tablet [id number] Yes Take 1 tablet (100 mg total) by mouth daily. Erin Bascom RAMAN, NP  Active              Assessment/Plan:   Diabetes: - Currently controlled per last A1c of 6.7% below goal <7%. She is now able to access GLP-1RA again and has successfully restarted Ozempic . She is a good candidate for GLP-1RA with metabolic  syndrome and BMI > 35.  She requests a refill of Lyrica  (pregabalin ) for her nerve pain.  - Reviewed long term cardiovascular and renal outcomes of uncontrolled blood sugar - Reviewed goal A1c, goal fasting, and goal 2 hour post prandial glucose - Reviewed dietary modifications including controlling portion size of carbohydrates, high fat foods - Reviewed lifestyle modifications including: increasing water  intake, physical activity as able - Recommended to continue Ozempic  0.25 mg x4 weeks then increase to 0.5 mg weekly. Patient was reeducated on dosing, storage, administration, and side effects. - Next A1C due August 2025   Hyperlipidemia/ASCVD Risk Reduction: - Currently uncontrolled with LDL-C of 120 mg/dL above goal <29 mg/dL, currently untreated. Patient historically has not been willing to start a statin. Will re-discuss at follow-up. She is not willing to attempt tobacco cessation at this time.  - Reviewed long term complications of uncontrolled cholesterol  Follow Up Plan: PCP 12/08/23, Pharmacist telephone 01/23/24  Lorain Baseman, PharmD [facility] [phone]

## 2023-12-05 NOTE — Transitions of Care (Post Inpatient/ED Visit) (Signed)
 12/05/2023  Name: Erin Zamora MRN: [id number] DOB: 1970/06/22  Today's TOC FU Call Status:   Patient's Name and Date of Birth confirmed.  Transition Care Management Follow-up Telephone Call Date of Discharge: 11/27/23 [facility] Type of Discharge: Emergency Department How have you been since you were released from the hospital?: Better Any questions or concerns?: No  Items Reviewed: Did you receive and understand the discharge instructions provided?: Yes Medications obtained,verified, and reconciled?: Yes (Medications Reviewed) Any new allergies since your discharge?: No Dietary orders reviewed?: NA Do you have support at home?: Yes  Medications Reviewed Today: Medications Reviewed Today     Reviewed by Starlene Charlynn BIRCH, CMA (Certified Medical Assistant) on 12/05/23 at 1035  Med List Status: <None>   Medication Order Taking? Sig Documenting Provider Last Dose Status Informant  acetaminophen  (TYLENOL ) 325 MG tablet [id number]  Take 2 tablets (650 mg total) by mouth every 6 (six) hours as needed. Elnor Jayson LABOR, DO  Active Self           Med Note SOILA LYLE BROCKS   Fri Feb 04, 2022  3:00 PM)    albuterol  (VENTOLIN  HFA) 108 [phone] Base) MCG/ACT inhaler [id number]  Inhale 1-2 puffs into the lungs every 6 (six) hours as needed for wheezing or shortness of breath. [provider]  Active Self  buPROPion  ER (WELLBUTRIN  SR) 100 MG 12 hr tablet [id number]  Take 1 tablet (100 mg total) by mouth 2 (two) times daily. Oley Bascom RAMAN, NP  Active   cyanocobalamin (VITAMIN B12) 1000 MCG tablet [id number]  Take 1 tablet (1,000 mcg total) by mouth daily.  Patient not taking: Reported on 09/21/2023   Camara, Amadou, MD  Active   dapagliflozin  propanediol (FARXIGA ) 10 MG TABS tablet [id number]  Take 1 tablet (10 mg total) by mouth daily before breakfast. Nichols, Tonya S, NP  Active   ibuprofen  (ADVIL ) 200 MG tablet [id number]  Take 800 mg by mouth every 4 (four)  hours as needed for moderate pain. [provider]  Active Self           Med Note DELSA LORAIN SQUIBB   Thu Dec 08, 2022  1:08 PM) Taking 800 mg BID  magnesium  chloride (SLOW-MAG) 64 MG TBEC SR tablet [id number]  Take 1 tablet by mouth daily.  Patient not taking: Reported on 09/21/2023   [provider]  Active   pregabalin  (LYRICA ) 25 MG capsule [id number]  TAKE 1 CAPSULE BY MOUTH THREE TIMES DAILY  Patient not taking: Reported on 05/01/2023   Urbano Albright, MD  Active            Med Note DELSA LORAIN SQUIBB Pablo May 01, 2023  6:54 PM) Out since Sept 2/2 no insurance  Semaglutide ,0.25 or 0.5MG /DOS, 2 MG/3ML NELMA [id number] Yes Inject 0.25 mg into the skin once a week. Oley Bascom RAMAN, NP  Active   sertraline  (ZOLOFT ) 100 MG tablet [id number] Yes Take 1 tablet (100 mg total) by mouth daily. Oley Bascom RAMAN, NP  Active             Home Care and Equipment/Supplies: Were Home Health Services Ordered?: NA Any new equipment or medical supplies ordered?: NA  Functional Questionnaire: Do you need assistance with bathing/showering or dressing?: No Do you need assistance with meal preparation?: No Do you need assistance with eating?: No Do you have difficulty maintaining continence: No Do you need assistance with getting out of bed/getting out of a chair/moving?:  No Do you have difficulty managing or taking your medications?: No  Follow up appointments reviewed: PCP Follow-up appointment confirmed?: Yes Date of PCP follow-up appointment?: 12/08/23 Specialist Hospital Follow-up appointment confirmed?: NA Do you need transportation to your follow-up appointment?: No Do you understand care options if your condition(s) worsen?: Yes-patient verbalized understanding    SIGNATURE Ronny Korff, RMA

## 2023-12-06 MED ORDER — PREGABALIN 25 MG PO CAPS: 25.0000 mg | ORAL_CAPSULE | Freq: Two times a day (BID) | ORAL | 1 refills | Status: AC

## 2023-12-08 ENCOUNTER — Encounter: Payer: Self-pay | Admitting: Nurse Practitioner

## 2023-12-08 ENCOUNTER — Encounter (INDEPENDENT_AMBULATORY_CARE_PROVIDER_SITE_OTHER): Payer: Self-pay | Admitting: Nurse Practitioner

## 2023-12-11 ENCOUNTER — Encounter: Payer: Self-pay | Admitting: Nurse Practitioner

## 2023-12-11 ENCOUNTER — Telehealth (INDEPENDENT_AMBULATORY_CARE_PROVIDER_SITE_OTHER): Payer: Self-pay | Admitting: Nurse Practitioner

## 2023-12-11 VITALS — Ht 65.0 in | Wt 242.0 lb

## 2023-12-11 DIAGNOSIS — D175 Benign lipomatous neoplasm of intra-abdominal organs: Secondary | ICD-10-CM | POA: Diagnosis not present

## 2023-12-11 DIAGNOSIS — K760 Fatty (change of) liver, not elsewhere classified: Secondary | ICD-10-CM

## 2023-12-11 DIAGNOSIS — K5790 Diverticulosis of intestine, part unspecified, without perforation or abscess without bleeding: Secondary | ICD-10-CM | POA: Diagnosis not present

## 2023-12-11 NOTE — Progress Notes (Signed)
 Virtual Visit via Telephone Note  I connected with Erin Zamora on 12/11/23 at 10:00 AM EDT by telephone and verified that I am speaking with the correct person using two identifiers.  Location: Patient: home Provider: office   I discussed the limitations, risks, security and privacy concerns of performing an evaluation and management service by telephone and the availability of in person appointments. I also discussed with the patient that there may be a patient responsible charge related to this service. The patient expressed understanding and agreed to proceed.   History of Present Illness:  Patient presents today for telephone visit.  She is requesting referral to GI.  She did have a recent CT of her abdomen which shows some abnormal findings including a lipoma to her stomach, fatty liver disease, diverticulosis. Denies f/c/s, n/v/d, hemoptysis, PND, leg swelling Denies chest pain or edema      Observations/Objective:     12/11/2023   10:25 AM 11/27/2023    6:05 PM 11/27/2023    6:00 PM  Vitals with BMI  Height 5' 5    Weight 242 lbs    BMI 40.27    Pulse  79 70      Assessment and Plan:   1. Hepatic steatosis (Primary)  - Ambulatory referral to Gastroenterology  2. Lipoma of stomach  - Ambulatory referral to Gastroenterology  3. Diverticulosis  - Ambulatory referral to Gastroenterology     I discussed the assessment and treatment plan with the patient. The patient was provided an opportunity to ask questions and all were answered. The patient agreed with the plan and demonstrated an understanding of the instructions.   The patient was advised to call back or seek an in-person evaluation if the symptoms worsen or if the condition fails to improve as anticipated.  I provided 22 minutes of non-face-to-face time during this encounter.   Bascom GORMAN Borer, NP

## 2023-12-21 NOTE — Progress Notes (Signed)
 error

## 2023-12-26 ENCOUNTER — Encounter: Payer: Self-pay | Admitting: Oncology

## 2024-01-02 ENCOUNTER — Other Ambulatory Visit: Payer: Self-pay

## 2024-01-10 ENCOUNTER — Ambulatory Visit: Payer: 59 | Admitting: Neurology

## 2024-01-16 ENCOUNTER — Ambulatory Visit: Admitting: Neurology

## 2024-01-16 ENCOUNTER — Encounter: Payer: Self-pay | Admitting: Neurology

## 2024-01-23 ENCOUNTER — Other Ambulatory Visit (INDEPENDENT_AMBULATORY_CARE_PROVIDER_SITE_OTHER): Payer: Self-pay

## 2024-01-23 DIAGNOSIS — E1165 Type 2 diabetes mellitus with hyperglycemia: Secondary | ICD-10-CM

## 2024-01-23 MED ORDER — OZEMPIC (1 MG/DOSE) 4 MG/3ML ~~LOC~~ SOPN
1.0000 mg | PEN_INJECTOR | SUBCUTANEOUS | 1 refills | Status: DC
Start: 2024-01-23 — End: 2024-03-06

## 2024-01-23 NOTE — Progress Notes (Signed)
 01/23/2024 Name: Erin Zamora MRN: [id number] DOB: [birth year]  Chief Complaint  Patient presents with   Diabetes    Erin Zamora is a 53 y.o. year old female who presented for a telephone visit. PMH includes T2DM, HTN, HLD, COPD, obesity, dysphagia (chronic throat clearer).    They were referred to the pharmacist by their PCP for assistance in managing diabetes and hyperlipidemia.   Subjective: Patient was last seen by PCP, Bascom Borer, NP on 09/21/23. At this visit, her BP was elevated to 144/79 mmHg. Her A1C had decreased from 7.2% to 6.7%. She requested to restart Ozempic  for weight loss. She admitted to having a tought time recently with both her grandmother and husband passing over the past few months. She was initiated on bupropion , in addition to her sertraline , for depression. At pharmacy f/u via telephone on 12/05/23, she reported doing ok. She did not feel ready to quit smoking given recent life events. She had restarted Ozempic  successfully. She was seen by PCP via telehealth on 12/11/23. She was referred to GI for incidental finding of lipoma on recent CT abdomen.   Today, patient reports doing well. She just took her second dose of Ozempic  0.5 mg and is feeling good. She has made a lot of diet and lifestyle changes. She reports she is already down 30 lbs since starting Ozempic .   Care Team: Primary Care Provider: Borer Bascom RAMAN, NP ; needs to be scheduled  Medication Access/Adherence  Current Pharmacy:  [facility] [address] ([place]), [place] - [address] [place] ([place]) [place] [address] Phone: [phone] Fax: [phone]  [facility] - [place], [place] - [address] [place] [address] Phone: [phone] Fax: [phone]   Patient reports affordability concerns with their medications: No   Patient reports access/transportation concerns to their pharmacy: No  Patient reports adherence  concerns with their medications:  No     Diabetes:  Current medications: Ozempic  0.5 mg weekly (just took 2nd dose of 0.5 mg this past Sunday)  Medications tried in the past: metformin (dizziness, fatigue) - discussed better tolerability of XR version and ability to start low-dose and patient is unwilling to retrial  Denies GI upset, n/v, abdominal pain with Ozempic .  Patient is not checking BG at home. Does not have supplies. Is not interested at this time.   Patient denies hypoglycemic s/sx including dizziness, shakiness, sweating. 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   Patient denies hyperglycemic symptoms including polyuria, polydipsia, polyphagia, and nocturia.  Nutritional Patterns: Reports she is eating 3 meals per day. Maximizing protein and eating less carbohydrates. Still occasional burger/fast food.    Physical activity; Going to the gym - doing 3 days a week (treadmill, 30 min workout room at [employer], treadmill again)    Hyperlipidemia/ASCVD Risk Reduction  Current lipid lowering medications: none - previously prescribed rosuvastation 20 mg daily but did not fill due to wanting to focus on lifestyle interventions first.   Family History: stroke in mother, MI/stroke/DM in father Risk Factors: DM, tobacco (3/4 PPD)  PREVENT Risk Score: 10 year risk of CVD: 7.7% - 10 year risk of ASCVD: 4.8% - 10 year risk of HF: 5.3%  Clinical ASCVD:  No  The 10-year ASCVD risk score (Arnett DK, et al., 2019) is: 3.8%   Values used to calculate the score:     Age: 68 years     Clincally relevant sex: Female     Is Non-Hispanic African American: No     Diabetic: No     Tobacco smoker: Yes     Systolic Blood Pressure: 124 mmHg     Is BP treated: No     HDL Cholesterol: 49 mg/dL      Total Cholesterol: 189 mg/dL    Objective:  Lab Results  Component Value Date   HGBA1C 7.2 (H) 10/06/2022   A1C (not recorded in chart) 09/21/23: 6.7%  Lab Results  Component Value Date   CREATININE 0.65 11/27/2023   BUN 10 11/27/2023   NA 136 11/27/2023   K 3.7 11/27/2023   CL 100 11/27/2023   CO2 26 11/27/2023    Lab Results  Component Value Date   CHOL 189 09/21/2023   HDL 49 09/21/2023   LDLCALC 120 (H) 09/21/2023   TRIG 109 09/21/2023   CHOLHDL 3.9 09/21/2023    Medications Reviewed Today     Reviewed by Brinda Lorain SQUIBB, RPH (Pharmacist) on 01/23/24 at 1515  Med List Status: <None>   Medication Order Taking? Sig Documenting Provider Last Dose Status Informant  acetaminophen  (TYLENOL ) 325 MG tablet [id number]  Take 2 tablets (650 mg total) by mouth every 6 (six) hours as needed. Elnor Jayson LABOR, DO  Active Self           Med Note SOILA LYLE BROCKS   Fri Feb 04, 2022  3:00 PM)    albuterol  (VENTOLIN  HFA) 108 [phone] Base) MCG/ACT inhaler [id number]  Inhale 1-2 puffs into the lungs every 6 (six) hours as needed for wheezing or shortness of breath. [provider]  Active Self   Patient not taking:   Discontinued 01/23/24 1515 (Patient Preference)   ibuprofen  (ADVIL ) 200 MG tablet [id number] Yes Take 800 mg by mouth every 4 (four) hours as needed for moderate pain. [provider]  Active Self           Med Note DELSA LORAIN SQUIBB   Thu Dec 08, 2022  1:08 PM) Taking 800 mg BID  pregabalin  (LYRICA ) 25 MG capsule [id number] Yes Take 1 capsule (25 mg total) by mouth 2 (two) times daily. Oley Bascom RAMAN, NP  Active   Semaglutide , 1 MG/DOSE, (OZEMPIC , 1 MG/DOSE,) 4 MG/3ML SOPN [id number]  Inject 1 mg into the skin once a week. Oley Bascom RAMAN, NP  Active   Semaglutide ,0.25 or 0.5MG /DOS, 2 MG/3ML NELMA [id number] Yes Inject 0.5 mg into the skin once a week. Oley Bascom RAMAN, NP  Active   sertraline  (ZOLOFT ) 100 MG tablet [id number] Yes Take 1 tablet (100 mg total) by mouth  daily. Oley Bascom RAMAN, NP  Active              Assessment/Plan:   Diabetes: - Currently controlled per last A1c of 6.7% below goal <7%. She is now able to access GLP-1RA again and has successfully restarted Ozempic  and has recently titrated up to 0.5 mg weekly. She is a good candidate for GLP-1RA with metabolic syndrome and BMI > 35.  Congratulated her on significant diet and lifestyle changes. - Reviewed long term cardiovascular and renal outcomes of uncontrolled blood sugar - Reviewed goal A1c, goal fasting, and goal 2 hour post prandial glucose - Reviewed dietary modifications including controlling portion size of carbohydrates,  high fat foods - Reviewed lifestyle modifications including: increasing water  intake, physical activity as able - Recommended to continue Ozempic  0.5 mg weekly. Will collaborate with PCP to send Rx for titration to Ozempic  1 mg weekly, but discussed with patient that she can stay on 0.5 mg weekly if she is noticing continued weight loss and is tolerating well.  - Next A1C due November 2025    Hyperlipidemia/ASCVD Risk Reduction: - Currently uncontrolled with LDL-C of 120 mg/dL above goal <29 mg/dL, currently untreated. Patient historically has not been willing to start a statin. Will re-discuss at follow-up. She is not willing to attempt tobacco cessation at this time - will plan to discuss at follow-up since she has made so many other healthy changes at this time.    Follow Up Plan: PCP 05/23/23, Pharmacist telephone 03/06/24  Lorain Baseman, PharmD [facility] [phone]

## 2024-02-23 ENCOUNTER — Encounter: Payer: Self-pay | Admitting: Nurse Practitioner

## 2024-03-06 ENCOUNTER — Other Ambulatory Visit (INDEPENDENT_AMBULATORY_CARE_PROVIDER_SITE_OTHER): Payer: Self-pay

## 2024-03-06 DIAGNOSIS — E119 Type 2 diabetes mellitus without complications: Secondary | ICD-10-CM

## 2024-03-06 MED ORDER — SEMAGLUTIDE(0.25 OR 0.5MG/DOS) 2 MG/3ML ~~LOC~~ SOPN: 0.5000 mg | PEN_INJECTOR | SUBCUTANEOUS | 5 refills | Status: AC

## 2024-03-06 NOTE — Progress Notes (Signed)
 03/06/2024 Name: Erin Zamora MRN: [id number] DOB: Jan 07, 1971  Chief Complaint  Patient presents with   Diabetes    Erin Zamora is a 53 y.o. year old female who presented for a telephone visit. PMH includes T2DM, HTN, HLD, COPD, obesity, dysphagia (chronic throat clearer).    They were referred to the pharmacist by their PCP for assistance in managing diabetes and hyperlipidemia.   Subjective: Patient was last seen by PCP, Bascom Borer, NP on 09/21/23. At this visit, her BP was elevated to 144/79 mmHg. Her A1C had decreased from 7.2% to 6.7%. She requested to restart Ozempic  for weight loss. She admitted to having a tought time recently with both her grandmother and husband passing over the past few months. She was initiated on bupropion , in addition to her sertraline , for depression. At pharmacy f/u via telephone on 12/05/23, she reported doing ok. She did not feel ready to quit smoking given recent life events. She had restarted Ozempic  successfully. She was seen by PCP via telehealth on 12/11/23. She was referred to GI for incidental finding of lipoma on recent CT abdomen. At pharmacy call on9/23/25, patient was doing well on Ozempic  0.5 mg weekly. Was down 30 lbs. Had made significant lifestyle changes.   Today, patient reports doing well. She has decided to stay on Ozempic  0.5 mg weekly as she is still actively losing weight. Down to 214 lbs (from starting at 249 lbs). Patient reports she is available for an abbreviated call today.   Care Team: Primary Care Provider: Borer Bascom RAMAN, NP ; 05/22/24  Medication Access/Adherence  Current Pharmacy:  [facility] [address] ([place]), [place] - [address] [place] ([place]) [place] [address] Phone: [phone] Fax: [phone]  [facility] - [place], [place] - [address] [place] [address] Phone: [phone] Fax: [phone]   Patient reports affordability  concerns with their medications: No   Patient reports access/transportation concerns to their pharmacy: No  Patient reports adherence concerns with their medications:  No     Diabetes:  Current medications: Ozempic  0.5 mg weekly   Medications tried in the past: metformin (dizziness, fatigue) - discussed better tolerability of XR version and ability to start low-dose and patient is unwilling to retrial  Denies GI upset, n/v, abdominal pain with Ozempic .  Patient is not checking BG at home. Does not have supplies. Is not interested at this time.   Patient denies hypoglycemic s/sx including dizziness, shakiness, sweating. 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   Patient denies hyperglycemic symptoms including polyuria, polydipsia, polyphagia, and nocturia.  Nutritional Patterns: Reports she is eating 3 meals per day. Maximizing protein and eating less carbohydrates. Still occasional burger/fast food.    Physical activity; Going to the gym - doing 3 days a week (treadmill, 30 min workout room at [employer], treadmill again)    Hyperlipidemia/ASCVD Risk Reduction  Current lipid lowering medications: none - previously prescribed rosuvastation 20 mg daily but did not fill due to wanting to focus on lifestyle interventions first.   Family History: stroke in mother, MI/stroke/DM in father Risk Factors: DM, tobacco (3/4 PPD)  PREVENT Risk Score: 10 year risk of CVD: 7.7% - 10 year risk of ASCVD:  4.8% - 10 year risk of HF: 5.3%  Clinical ASCVD: No  The 10-year ASCVD risk score (Arnett DK, et al., 2019) is: 4%   Values used to calculate the score:     Age: 75 years     Clincally relevant sex: Female     Is Non-Hispanic African American: No     Diabetic: No     Tobacco smoker: Yes      Systolic Blood Pressure: 124 mmHg     Is BP treated: No     HDL Cholesterol: 49 mg/dL     Total Cholesterol: 189 mg/dL    Objective:  Lab Results  Component Value Date   HGBA1C 7.2 (H) 10/06/2022   A1C (not recorded in chart) 09/21/23: 6.7%  Lab Results  Component Value Date   CREATININE 0.65 11/27/2023   BUN 10 11/27/2023   NA 136 11/27/2023   K 3.7 11/27/2023   CL 100 11/27/2023   CO2 26 11/27/2023    Lab Results  Component Value Date   CHOL 189 09/21/2023   HDL 49 09/21/2023   LDLCALC 120 (H) 09/21/2023   TRIG 109 09/21/2023   CHOLHDL 3.9 09/21/2023    Medications Reviewed Today     Reviewed by Brinda Lorain SQUIBB, RPH (Pharmacist) on 03/06/24 at 1212  Med List Status: <None>   Medication Order Taking? Sig Documenting Provider Last Dose Status Informant  acetaminophen  (TYLENOL ) 325 MG tablet [id number]  Take 2 tablets (650 mg total) by mouth every 6 (six) hours as needed. Elnor Jayson LABOR, DO  Active Self           Med Note SOILA LYLE BROCKS   Fri Feb 04, 2022  3:00 PM)    albuterol  (VENTOLIN  HFA) 108 [phone] Base) MCG/ACT inhaler [id number]  Inhale 1-2 puffs into the lungs every 6 (six) hours as needed for wheezing or shortness of breath. [provider]  Active Self  ibuprofen  (ADVIL ) 200 MG tablet [id number]  Take 800 mg by mouth every 4 (four) hours as needed for moderate pain. [provider]  Active Self           Med Note DELSA LORAIN SQUIBB   Thu Dec 08, 2022  1:08 PM) Taking 800 mg BID  pregabalin  (LYRICA ) 25 MG capsule [id number]  Take 1 capsule (25 mg total) by mouth 2 (two) times daily. Oley Bascom RAMAN, NP  Active     Discontinued 03/06/24 1038 (Dose change)            Med Note>> Brinda Lorain SQUIBB, [facility]   03/06/2024 10:38 AM Patient staying on 0.5 mg for now    Semaglutide ,0.25 or 0.5MG /DOS, 2 MG/3ML SOPN [id number] Yes Inject 0.5 mg into the skin once a week. Oley Bascom RAMAN, NP  Active   sertraline  (ZOLOFT ) 100 MG tablet [id number]  Take 1 tablet (100 mg total)  by mouth daily. Oley Bascom RAMAN, NP  Active              Assessment/Plan:   Diabetes: - Currently controlled per last A1c of 6.7% below goal <7%. She is now able to access GLP-1RA again and is tolerating Ozempic  0.5 mg weekly very well. Will not push dose as she is continuing to lose weight. She is a good candidate for GLP-1RA with metabolic syndrome and BMI > 35.  Congratulated her on significant diet and lifestyle changes. - Reviewed long term cardiovascular and renal outcomes of uncontrolled blood sugar - Reviewed goal A1c, goal fasting, and goal  2 hour post prandial glucose - Reviewed dietary modifications including controlling portion size of carbohydrates, high fat foods - Reviewed lifestyle modifications including: increasing water  intake, physical activity as able - Recommended to continue Ozempic  0.5 mg weekly - Next A1C due November 2025    Hyperlipidemia/ASCVD Risk Reduction: - Currently uncontrolled with LDL-C of 120 mg/dL above goal <29 mg/dL, currently untreated. Patient historically has not been willing to start a statin. At previously visit, she was not willing to attempt tobacco cessation, but will revisit at follow-up.    Follow Up Plan: PCP 05/23/23, Pharmacist telephone 06/11/24  Lorain Baseman, PharmD [facility] [phone]

## 2024-05-15 ENCOUNTER — Encounter: Payer: Self-pay | Admitting: Oncology

## 2024-05-22 ENCOUNTER — Ambulatory Visit: Payer: Self-pay | Admitting: Nurse Practitioner

## 2024-06-11 ENCOUNTER — Other Ambulatory Visit: Payer: Self-pay
